# Patient Record
Sex: Male | Born: 1970 | Race: Black or African American | Hispanic: No | Marital: Single | Smoking: Never smoker
Health system: Southern US, Community
[De-identification: ages and names within clinical notes are randomized; demographics above are authoritative.]

## PROBLEM LIST (undated history)

## (undated) DIAGNOSIS — C499 Malignant neoplasm of connective and soft tissue, unspecified: Secondary | ICD-10-CM

## (undated) DIAGNOSIS — I428 Other cardiomyopathies: Secondary | ICD-10-CM

## (undated) DIAGNOSIS — F1010 Alcohol abuse, uncomplicated: Secondary | ICD-10-CM

## (undated) DIAGNOSIS — I639 Cerebral infarction, unspecified: Secondary | ICD-10-CM

## (undated) DIAGNOSIS — E785 Hyperlipidemia, unspecified: Secondary | ICD-10-CM

## (undated) HISTORY — DX: Alcohol abuse, uncomplicated: F10.10

## (undated) HISTORY — DX: Other cardiomyopathies: I42.8

## (undated) HISTORY — DX: Malignant neoplasm of connective and soft tissue, unspecified: C49.9

## (undated) HISTORY — DX: Hyperlipidemia, unspecified: E78.5

## (undated) HISTORY — PX: OTHER SURGICAL HISTORY: SHX169

---

## 2014-10-06 ENCOUNTER — Emergency Department: Payer: Self-pay | Admitting: Emergency Medicine

## 2015-08-01 DIAGNOSIS — I639 Cerebral infarction, unspecified: Secondary | ICD-10-CM

## 2015-08-01 HISTORY — DX: Cerebral infarction, unspecified: I63.9

## 2015-11-01 ENCOUNTER — Emergency Department: Payer: MEDICAID

## 2015-11-01 ENCOUNTER — Inpatient Hospital Stay
Admission: EM | Admit: 2015-11-01 | Discharge: 2015-11-03 | DRG: 065 | Disposition: A | Discharge status: Rehabilitation Facility | Payer: Self-pay | Attending: Internal Medicine | Admitting: Internal Medicine

## 2015-11-01 ENCOUNTER — Emergency Department: Payer: Self-pay

## 2015-11-01 ENCOUNTER — Encounter: Payer: Self-pay | Admitting: Emergency Medicine

## 2015-11-01 DIAGNOSIS — R29704 NIHSS score 4: Secondary | ICD-10-CM | POA: Diagnosis present

## 2015-11-01 DIAGNOSIS — I63532 Cerebral infarction due to unspecified occlusion or stenosis of left posterior cerebral artery: Principal | ICD-10-CM | POA: Diagnosis present

## 2015-11-01 DIAGNOSIS — F1010 Alcohol abuse, uncomplicated: Secondary | ICD-10-CM | POA: Diagnosis present

## 2015-11-01 DIAGNOSIS — E785 Hyperlipidemia, unspecified: Secondary | ICD-10-CM | POA: Diagnosis present

## 2015-11-01 DIAGNOSIS — R4789 Other speech disturbances: Secondary | ICD-10-CM | POA: Diagnosis present

## 2015-11-01 DIAGNOSIS — R202 Paresthesia of skin: Secondary | ICD-10-CM

## 2015-11-01 DIAGNOSIS — G459 Transient cerebral ischemic attack, unspecified: Secondary | ICD-10-CM | POA: Diagnosis present

## 2015-11-01 DIAGNOSIS — E860 Dehydration: Secondary | ICD-10-CM | POA: Diagnosis present

## 2015-11-01 DIAGNOSIS — Z7982 Long term (current) use of aspirin: Secondary | ICD-10-CM

## 2015-11-01 DIAGNOSIS — I426 Alcoholic cardiomyopathy: Secondary | ICD-10-CM | POA: Diagnosis present

## 2015-11-01 DIAGNOSIS — G8191 Hemiplegia, unspecified affecting right dominant side: Secondary | ICD-10-CM | POA: Diagnosis present

## 2015-11-01 DIAGNOSIS — I639 Cerebral infarction, unspecified: Secondary | ICD-10-CM | POA: Diagnosis present

## 2015-11-01 LAB — BASIC METABOLIC PANEL
Anion gap: 9 (ref 5–15)
BUN: 21 mg/dL — ABNORMAL HIGH (ref 6–20)
CO2: 24 mmol/L (ref 22–32)
Calcium: 9.5 mg/dL (ref 8.9–10.3)
Chloride: 102 mmol/L (ref 101–111)
Creatinine, Ser: 1.21 mg/dL (ref 0.61–1.24)
GFR calc Af Amer: >60 mL/min (ref >60)
GFR calc non Af Amer: >60 mL/min (ref >60)
Glucose, Bld: 102 mg/dL — ABNORMAL HIGH (ref 65–99)
Potassium: 3.5 mmol/L (ref 3.5–5.1)
Sodium: 135 mmol/L (ref 135–145)

## 2015-11-01 LAB — CBC WITH DIFFERENTIAL/PLATELET
Basophils Absolute: 0 10*3/uL (ref 0–0.1)
Basophils Relative: 0 %
Eosinophils Absolute: 0 10*3/uL (ref 0–0.7)
Eosinophils Relative: 0 %
HCT: 41.2 % (ref 40.0–52.0)
Hemoglobin: 13.8 g/dL (ref 13.0–18.0)
Lymphocytes Relative: 13 %
Lymphs Abs: 0.6 10*3/uL — ABNORMAL LOW (ref 1.0–3.6)
MCH: 29.2 pg (ref 26.0–34.0)
MCHC: 33.5 g/dL (ref 32.0–36.0)
MCV: 87.1 fL (ref 80.0–100.0)
Monocytes Absolute: 0.3 10*3/uL (ref 0.2–1.0)
Monocytes Relative: 6 %
Neutro Abs: 3.7 10*3/uL (ref 1.4–6.5)
Neutrophils Relative %: 81 %
Platelets: 146 10*3/uL — ABNORMAL LOW (ref 150–440)
RBC: 4.73 MIL/uL (ref 4.40–5.90)
RDW: 15.5 % — ABNORMAL HIGH (ref 11.5–14.5)
WBC: 4.6 10*3/uL (ref 3.8–10.6)

## 2015-11-01 LAB — TROPONIN I: Troponin I: <0.03 ng/mL (ref <0.031)

## 2015-11-01 LAB — GLUCOSE, CAPILLARY: Glucose-Capillary: 79 mg/dL (ref 65–99)

## 2015-11-01 MED ORDER — ACETAMINOPHEN 500 MG PO TABS
1000.0000 mg | ORAL_TABLET | Freq: Once | ORAL | Status: AC
  Administered 2015-11-01: 1000 mg via ORAL

## 2015-11-01 MED ORDER — IOPAMIDOL (ISOVUE-370) INJECTION 76%
75.0000 mL | Freq: Once | INTRAVENOUS | Status: AC | PRN
  Administered 2015-11-01: 75 mL via INTRAVENOUS
  Filled 2015-11-01: qty 75

## 2015-11-01 MED ORDER — ONDANSETRON 4 MG PO TBDP
4.0000 mg | ORAL_TABLET | Freq: Once | ORAL | Status: AC | PRN
  Administered 2015-11-01: 4 mg via ORAL

## 2015-11-01 MED ORDER — ASPIRIN 81 MG PO CHEW
CHEWABLE_TABLET | ORAL | Status: AC
  Filled 2015-11-01: qty 4

## 2015-11-01 MED ORDER — ASPIRIN EC 81 MG PO TBEC
81.0000 mg | DELAYED_RELEASE_TABLET | Freq: Every day | ORAL | Status: DC
Start: 2015-11-01 — End: 2015-11-02

## 2015-11-01 MED ORDER — ACETAMINOPHEN 500 MG PO TABS
ORAL_TABLET | ORAL | Status: AC
  Filled 2015-11-01: qty 2

## 2015-11-01 MED ORDER — ONDANSETRON 4 MG PO TBDP
ORAL_TABLET | ORAL | Status: AC
  Administered 2015-11-01: 4 mg via ORAL
  Filled 2015-11-01: qty 1

## 2015-11-01 MED ORDER — ASPIRIN 81 MG PO CHEW
324.0000 mg | CHEWABLE_TABLET | Freq: Once | ORAL | Status: AC
  Administered 2015-11-01: 324 mg via ORAL

## 2015-11-01 NOTE — ED Provider Notes (Addendum)
[facility] Emergency Department Provider Note  ____________________________________________  Time seen: 4:50 PM  I have reviewed the triage vital signs and the nursing notes.   HISTORY  Chief Complaint Numbness    HPI Austin Holmes is a 45 y.o. male who complains of pain and stiffness in the right leg. To me he reports that it started after lunch around 12:30 today as he was getting back up from sitting. In triage she reported that it started yesterday. 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 Denies any sudden injuries or strains that he is aware of. No recent falls. No weakness anywhere. No headaches or vision changes. No other symptoms anywhere. No changes in bowel or bladder habits. Patient indicates the posterior thigh as the location of concern.     History reviewed. No pertinent past medical history.   There are no active problems to display for this patient.    History reviewed. No pertinent past surgical history.   Current Outpatient Rx  Name  Route  Sig  Dispense  Refill  . aspirin EC 81 MG tablet   Oral   Take 1 tablet (81 mg total) by mouth daily.   60 tablet   0    none.   Allergies Review of patient's allergies indicates no known allergies.   No family history on file.  Social History Social History  Substance Use Topics  . Smoking status: Never Smoker   . Smokeless tobacco: None  . Alcohol Use: None    Review of Systems  Constitutional:   No fever or chills. No weight changes Eyes:   No vision changes.  ENT:   No sore throat. No rhinorrhea. Cardiovascular:   No chest pain. Respiratory:   No dyspnea or cough. Gastrointestinal:   Negative for abdominal pain, vomiting and diarrhea.  No BRBPR or melena. Genitourinary:   Negative for dysuria or difficulty urinating. Musculoskeletal:   Right  posterior thigh pain Skin:   Negative for rash. Neurological:   Negative for headaches, focal weakness. Patient reports some paresthesia in the right thigh.  10-point ROS otherwise negative.  ____________________________________________   PHYSICAL EXAM:  VITAL SIGNS: ED Triage Vitals  Enc Vitals Group     BP 11/01/15 1519 149/100 mmHg     Pulse Rate 11/01/15 1519 115     Resp 11/01/15 1519 20     Temp 11/01/15 1519 98.3 F (36.8 C)     Temp Source 11/01/15 1519 Oral     SpO2 11/01/15 1519 99 %     Weight 11/01/15 1519 210 lb (95.255 kg)     Height 11/01/15 1519 6\' 1"  (1.854 m)     Head Cir --      Peak Flow --      Pain Score 11/01/15 1702 5     Pain Loc --      Pain Edu? --      Excl. in [place]? --     Vital signs reviewed, nursing assessments reviewed.   Constitutional:   Alert and oriented. Well appearing and in no distress. Eyes:   No scleral icterus. No conjunctival pallor. PERRL. EOMI ENT   Head:   Normocephalic and atraumatic.   Nose:   No congestion/rhinnorhea. No septal hematoma   Mouth/Throat:   MMM, no pharyngeal erythema. No peritonsillar mass.    Neck:   No stridor.  No SubQ emphysema. No meningismus. Hematological/Lymphatic/Immunilogical:   No cervical lymphadenopathy. Cardiovascular:   RRR. Symmetric bilateral radial and DP pulses.  No murmurs. Heart rate 90. Respiratory:   Normal respiratory effort without tachypnea nor retractions. Breath sounds are clear and equal bilaterally. No wheezes/rales/rhonchi. Gastrointestinal:   Soft and nontender. Non distended. There is no CVA tenderness.  No rebound, rigidity, or guarding. Genitourinary:   deferred Musculoskeletal:   Nontender with normal range of motion in all extremities. No joint effusions.  No lower extremity tenderness.  No edema. Straight leg raise negative bilaterally. Neurologic:   Normal speech and language.  CN 2-10 normal. Motor grossly intact. Ambulatory, steady gait. Normal  coordination and balance. No gross focal neurologic deficits are appreciated.  Skin:    Skin is warm, dry and intact. No rash noted.  No petechiae, purpura, or bullae. Psychiatric:   Mood and affect are normal. ____________________________________________    LABS (pertinent positives/negatives) (all labs ordered are listed, but only abnormal results are displayed) Labs Reviewed  BASIC METABOLIC PANEL - Abnormal; Notable for the following:    Glucose, Bld 102 (*)    BUN 21 (*)    All other components within normal limits  CBC WITH DIFFERENTIAL/PLATELET - Abnormal; Notable for the following:    RDW 15.5 (*)    Platelets 146 (*)    Lymphs Abs 0.6 (*)    All other components within normal limits  GLUCOSE, CAPILLARY  TROPONIN I  CBG MONITORING, ED   ____________________________________________   EKG  Interpreted by me Sinus tachycardia rate 101, normal axis and intervals. Left bundle branch block. Diffuse T-wave inversions consistent with repolarization abnormality. No acute ischemic changes by Sgarbossa criteria.  ____________________________________________    RADIOLOGY  CT head unremarkable  ____________________________________________   PROCEDURES  CRITICAL CARE Performed by: Joni Fears, Pharrah Rottman   Total critical care time: 35 minutes  Critical care time was exclusive of separately billable procedures and treating other patients.  Critical care was necessary to treat or prevent imminent or life-threatening deterioration.  Critical care was time spent personally by me on the following activities: development of treatment plan with patient and/or surrogate as well as nursing, discussions with consultants, evaluation of patient's response to treatment, examination of patient, obtaining history from patient or surrogate, ordering and performing treatments and interventions, ordering and review of laboratory studies, ordering and review of radiographic studies, pulse  oximetry and re-evaluation of patient's condition.  ____________________________________________   INITIAL IMPRESSION / ASSESSMENT AND PLAN / ED COURSE  Pertinent labs & imaging results that were available during my care of the patient were reviewed by me and considered in my medical decision making (see chart for details).  Patient well appearing no acute distress. Complains of isolated pain and paresthesia of the right posterior thigh without any red flags for lower back pain or spinal or neurologic issue. Very low suspicion for stroke or intracranial hemorrhage. Patient has absolutely no risk factors for underlying cardiovascular disease. We'll put the patient on low-dose aspirin, NSAIDs as needed, follow up with primary care. The tachycardia at triage was not persistent on my exam. Low suspicion for any underlying infectious process. The patient may be mildly dehydrated but is tolerating oral intake.    ----------------------------------------- 6:48 PM on 11/01/2015 -----------------------------------------  On preparing for discharge at 5:50 PM, the patient was noted to be ataxic when standing. He did state that he felt somewhat dizzy as well. In the setting of his tachycardia at triage, this may be due to  some dehydration so the patient was given food and fluids to drink. He is reassessed at 6:40 PM after having consumed all these things, and actually seemed worse. He has found at that time to have demonstrable weakness of the right leg with hip flexion and resistance against gravity. It's unclear the symptom onset but it's likely at least 6 hours ago at this point and the patient would not be a candidate for TPA. He was given an oral aspirin load. At this point we will check labs and plan to admit for a stroke workup.    ----------------------------------------- 7:06 PM on 11/01/2015 -----------------------------------------  On continued assessment symptoms continue to be worsening.  He exhibits some right upper extremity drift and now complains of some diplopia. This is particularly notable as I recall the patient was able to lift his arm to offer a firm handshake when I first introduced myself to him around 5 PM today. He now clarifies that he thinks his symptoms started at 2 PM and not 12:30 PM. NIH Stroke Scale = 4. Now being approximately 5 hours out from the onset of symptoms with this updated timeframe, we will initiate a code stroke for emergent neurology consultation as well as obtain a CT angiogram of the neck and brain to evaluate for any intervenable lesion   ----------------------------------------- 7:33 PM on 11/01/2015 -----------------------------------------  Case discussed with the telemetry neurology consultant after his evaluation of the patient. Agrees that the findings are consistent with an acute stroke. Because the patient's history is somewhat unreliable, he is considering the onset of the stroke to be sometime yesterday and the patient not to be a candidate for intervention with TPA or endovascular therapy.. However he does agree with proceeding with a CT angiogram at this time for further delineation of the extent of injury and to rule out dissection. Plan discussed with patient and family at the bedside, advised him and eventual need for admission for complete stroke workup after CT scan.  ----------------------------------------- 8:55 PM on 11/01/2015 -----------------------------------------  Discussed with radiologist Dr. [place] Crane at 8:30 PM. Advises there is not a large vessel occlusion that would be amenable to endovascular intervention, but he does see a second-order segmental occlusion in the left PCA territory which is compatible with the patient's symptoms. Case discussed with the hospitalist for admission. ____________________________________________   FINAL CLINICAL IMPRESSION(S) / ED DIAGNOSES  Final diagnoses:  Paresthesia of lower  extremity  Acute CVA (cerebrovascular accident) ([place])  Acute ischemic left PCA stroke [facility])      Carrie Mew, MD 11/01/15 1723  Carrie Mew, MD 11/01/15 2056

## 2015-11-01 NOTE — ED Notes (Signed)
Reports a numbness feeling shooting down right leg since yesterday.  Ambulates well, grips equal, PERRL.

## 2015-11-01 NOTE — ED Notes (Signed)
As this RN was discharging patient, I watched patient walk about 10 feet and patient was having some difficulty, holding onto the wall.  MD notified.  MD instructed this RN to give patient sandwich tray and then ED physician would re-evaluate patient.

## 2015-11-01 NOTE — ED Notes (Signed)
Patient given [place] sandwich tray at this time.

## 2015-11-02 ENCOUNTER — Encounter: Payer: Self-pay | Admitting: Internal Medicine

## 2015-11-02 ENCOUNTER — Observation Stay: Payer: Self-pay

## 2015-11-02 DIAGNOSIS — G459 Transient cerebral ischemic attack, unspecified: Secondary | ICD-10-CM | POA: Diagnosis present

## 2015-11-02 DIAGNOSIS — I639 Cerebral infarction, unspecified: Secondary | ICD-10-CM

## 2015-11-02 LAB — TROPONIN I
Troponin I: <0.03 ng/mL (ref <0.031)
Troponin I: <0.03 ng/mL (ref <0.031)
Troponin I: <0.03 ng/mL (ref <0.031)

## 2015-11-02 LAB — CBC
HCT: 41.5 % (ref 40.0–52.0)
Hemoglobin: 14 g/dL (ref 13.0–18.0)
MCH: 28.7 pg (ref 26.0–34.0)
MCHC: 33.8 g/dL (ref 32.0–36.0)
MCV: 84.7 fL (ref 80.0–100.0)
Platelets: 147 10*3/uL — ABNORMAL LOW (ref 150–440)
RBC: 4.9 MIL/uL (ref 4.40–5.90)
RDW: 15.2 % — ABNORMAL HIGH (ref 11.5–14.5)
WBC: 3.9 10*3/uL (ref 3.8–10.6)

## 2015-11-02 LAB — CREATININE, SERUM
Creatinine, Ser: 1.05 mg/dL (ref 0.61–1.24)
GFR calc Af Amer: >60 mL/min (ref >60)
GFR calc non Af Amer: >60 mL/min (ref >60)

## 2015-11-02 LAB — LIPID PANEL
Cholesterol: 202 mg/dL — ABNORMAL HIGH (ref 0–200)
HDL: 91 mg/dL (ref >40)
LDL Cholesterol: 104 mg/dL — ABNORMAL HIGH (ref 0–99)
Total CHOL/HDL Ratio: 2.2 RATIO
Triglycerides: 37 mg/dL (ref <150)
VLDL: 7 mg/dL (ref 0–40)

## 2015-11-02 LAB — SEDIMENTATION RATE: Sed Rate: 4 mm/hr (ref 0–15)

## 2015-11-02 MED ORDER — STROKE: EARLY STAGES OF RECOVERY BOOK
Freq: Once | Status: AC
  Administered 2015-11-02: 03:00:00

## 2015-11-02 MED ORDER — ASPIRIN EC 81 MG PO TBEC: 81.0000 mg | DELAYED_RELEASE_TABLET | Freq: Every day | ORAL | Status: DC

## 2015-11-02 MED ORDER — ACETAMINOPHEN 650 MG RE SUPP: 650.0000 mg | RECTAL | Status: DC | PRN

## 2015-11-02 MED ORDER — SENNOSIDES-DOCUSATE SODIUM 8.6-50 MG PO TABS: 1.0000 | ORAL_TABLET | Freq: Every evening | ORAL | Status: DC | PRN

## 2015-11-02 MED ORDER — ASPIRIN EC 325 MG PO TBEC
325.0000 mg | DELAYED_RELEASE_TABLET | Freq: Every day | ORAL | Status: DC
  Administered 2015-11-02 – 2015-11-03 (×2): 325 mg via ORAL
  Filled 2015-11-02 (×2): qty 1

## 2015-11-02 MED ORDER — ACETAMINOPHEN 325 MG PO TABS
650.0000 mg | ORAL_TABLET | ORAL | Status: DC | PRN
  Administered 2015-11-02: 650 mg via ORAL
  Filled 2015-11-02: qty 2

## 2015-11-02 MED ORDER — SODIUM CHLORIDE 0.9 % IV SOLN
INTRAVENOUS | Status: DC
  Administered 2015-11-02 – 2015-11-03 (×3): via INTRAVENOUS

## 2015-11-02 MED ORDER — ATORVASTATIN CALCIUM 20 MG PO TABS
40.0000 mg | ORAL_TABLET | Freq: Every day | ORAL | Status: DC
  Administered 2015-11-02: 40 mg via ORAL
  Filled 2015-11-02: qty 2

## 2015-11-02 MED ORDER — ATORVASTATIN CALCIUM 40 MG PO TABS: 40.0000 mg | ORAL_TABLET | Freq: Every day | ORAL | Status: DC

## 2015-11-02 MED ORDER — POTASSIUM CHLORIDE CRYS ER 20 MEQ PO TBCR
20.0000 meq | EXTENDED_RELEASE_TABLET | Freq: Once | ORAL | Status: AC
  Administered 2015-11-02: 20 meq via ORAL
  Filled 2015-11-02: qty 1

## 2015-11-02 MED ORDER — ENOXAPARIN SODIUM 40 MG/0.4ML ~~LOC~~ SOLN
40.0000 mg | SUBCUTANEOUS | Status: DC
  Administered 2015-11-02: 22:00:00 40 mg via SUBCUTANEOUS
  Filled 2015-11-02: qty 0.4

## 2015-11-02 NOTE — Progress Notes (Signed)
Rehab Admissions Coordinator Note:  Patient was screened by Cleatrice Burke for appropriateness for an Inpatient Acute Rehab Consult per PT recommendation. Noted RN CM has spoken with pt and his Mother and they are interested in inpt rehab. Karene Fry, Admissions Coordinator will follow up for full assessment tomorrow. She can be reached at [phone]  Cleatrice Burke 11/02/2015, 4:33 PM  I can be reached at [phone].

## 2015-11-02 NOTE — Progress Notes (Signed)
PT Cancellation Note  Patient Details Name: Austin Holmes MRN: [id number] DOB: 1971/02/15   Cancelled Treatment:    Reason Eval/Treat Not Completed: Patient at procedure or test/unavailable (Consult received and chart reviewed.  Patient currently off unit for diagnostic testing; will re-attempt at later time/date as patient available and medically appropriate.)   Mayar Whittier H. Owens Shark, PT, DPT, NCS 11/02/2015, 10:53 AM [phone]

## 2015-11-02 NOTE — Care Management (Addendum)
Admitted to [facility] with the diagnosis of TIA. Lives with mother, [name] [phone] or [phone])  Last seen a primary care physician 2 years ago. Gave mother the list of physicians accepting new patients. States she "will start working on it." Mr. Terry works at [employer] on [address] x 1.5 years. Took care of all basic activities of daily living himself prior to this admission. Can drive, but doesn't have a driver's license. No insurance.  Physical therapy evaluation completed. Recommending Inpatient Acute Rehabilitation. Spoke with Mr. Siefke and his mother at the bedside. Would like to go to [place], if possible. Text message to Karene Fry RN representative for Inpatient Acute Rehab at [place]. Shelbie Ammons RN MSN CCM Care Management [phone]

## 2015-11-02 NOTE — H&P (Signed)
[facility] [place] NAME: Austin Holmes    MR#:  [id number]  DATE OF BIRTH:  1971/04/08  DATE OF ADMISSION:  11/01/2015  PRIMARY CARE PHYSICIAN: No primary care provider on file.   REQUESTING/REFERRING PHYSICIAN:   CHIEF COMPLAINT:   Chief Complaint  Patient presents with  . Numbness    HISTORY OF PRESENT ILLNESS: Austin Holmes  is a 45 y.o. male with no significant past medical history presented to the emergency room with the weakness in the right upper and lower extremity. The above complaint started yesterday evening after patient came back from work. Patient works in a [employer]. No history of any seizure. No history of any headache. He also had some numbness initially but in the emergency room he was able to move his right upper extremity and lower extremity with out any difficulty. No numbness or any tingling sensation in the emergency room. No complaints of any slurred speech or difficulty swallowing food. No history of any facial droop. No history of any stroke in the past. Patient was worked up with a CT head which showed no acute intracranial abnormality. Telemetry neurology consult was also done and patient is out of the TPA window, recommended MRI brain and a carotid ultrasound study and to start aspirin.  PAST MEDICAL HISTORY:  History reviewed. No pertinent past medical history.  PAST SURGICAL HISTORY: Past Surgical History  Procedure Laterality Date  . None      SOCIAL HISTORY:  Social History  Substance Use Topics  . Smoking status: Never Smoker   . Smokeless tobacco: Not on file  . Alcohol Use: 0.0 oz/week    0 Standard drinks or equivalent per week     Comment: occasionally drinks beer    FAMILY HISTORY:  Family History  Problem Relation Age of Onset  . Diabetes Mellitus II Maternal Grandmother   . Diabetes Mellitus II Maternal Grandfather     DRUG ALLERGIES: No Known  Allergies  REVIEW OF SYSTEMS:   CONSTITUTIONAL: No fever, fatigue or weakness.  EYES: No blurred or double vision.  EARS, NOSE, AND THROAT: No tinnitus or ear pain.  RESPIRATORY: No cough, shortness of breath, wheezing or hemoptysis.  CARDIOVASCULAR: No chest pain, orthopnea, edema.  GASTROINTESTINAL: No nausea, vomiting, diarrhea or abdominal pain.  GENITOURINARY: No dysuria, hematuria.  ENDOCRINE: No polyuria, nocturia,  HEMATOLOGY: No anemia, easy bruising or bleeding SKIN: No rash or lesion. MUSCULOSKELETAL: No joint pain or arthritis.   NEUROLOGIC: No tingling, numbness initially yesterday, weakness right upper and right lower extremity yesterday.  PSYCHIATRY: No anxiety or depression.   MEDICATIONS AT HOME:  Prior to Admission medications   Medication Sig Start Date End Date Taking? Authorizing Provider  aspirin EC 81 MG tablet Take 1 tablet (81 mg total) by mouth daily. 11/01/15 10/31/16  Carrie Mew, MD      PHYSICAL EXAMINATION:   VITAL SIGNS: Blood pressure 127/78, pulse 101, temperature 98.3 F (36.8 C), temperature source Oral, resp. rate 21, height 6\' 1"  (1.854 m), weight 95.255 kg (210 lb), SpO2 99 %.  GENERAL:  45 y.o.-year-old patient lying in the bed with no acute distress.  EYES: Pupils equal, round, reactive to light and accommodation. No scleral icterus. Extraocular muscles intact.  HEENT: Head atraumatic, normocephalic. Oropharynx and nasopharynx clear.  NECK:  Supple, no jugular venous distention. No thyroid enlargement, no tenderness.  LUNGS: Normal breath sounds bilaterally, no wheezing, rales,rhonchi or crepitation. No use of accessory  muscles of respiration.  CARDIOVASCULAR: S1, S2 normal. No murmurs, rubs, or gallops.  ABDOMEN: Soft, nontender, nondistended. Bowel sounds present. No organomegaly or mass.  EXTREMITIES: No pedal edema, cyanosis, or clubbing.  NEUROLOGIC: Cranial nerves II through XII are intact. Muscle strength 5/5 in all extremities.  Sensation intact. Gait normal.No cerebellar signs noted.Marland Kitchen  PSYCHIATRIC: The patient is alert and oriented x 3.  SKIN: No obvious rash, lesion, or ulcer.   LABORATORY PANEL:   CBC  Recent Labs Lab 11/01/15 1853  WBC 4.6  HGB 13.8  HCT 41.2  PLT 146*  MCV 87.1  MCH 29.2  MCHC 33.5  RDW 15.5*  LYMPHSABS 0.6*  MONOABS 0.3  EOSABS 0.0  BASOSABS 0.0   ------------------------------------------------------------------------------------------------------------------  Chemistries   Recent Labs Lab 11/01/15 1853  NA 135  K 3.5  CL 102  CO2 24  GLUCOSE 102*  BUN 21*  CREATININE 1.21  CALCIUM 9.5   ------------------------------------------------------------------------------------------------------------------ estimated creatinine clearance is 86.2 mL/min (by C-G formula based on Cr of 1.21). ------------------------------------------------------------------------------------------------------------------ No results for input(s): TSH, T4TOTAL, T3FREE, THYROIDAB in the last 72 hours.  Invalid input(s): FREET3   Coagulation profile No results for input(s): INR, PROTIME in the last 168 hours. ------------------------------------------------------------------------------------------------------------------- No results for input(s): DDIMER in the last 72 hours. -------------------------------------------------------------------------------------------------------------------  Cardiac Enzymes  Recent Labs Lab 11/01/15 1853  TROPONINI <0.03   ------------------------------------------------------------------------------------------------------------------ Invalid input(s): POCBNP  ---------------------------------------------------------------------------------------------------------------  Urinalysis No results found for: COLORURINE, APPEARANCEUR, LABSPEC, PHURINE, GLUCOSEU, HGBUR, BILIRUBINUR, KETONESUR, PROTEINUR, UROBILINOGEN, NITRITE,  LEUKOCYTESUR   RADIOLOGY: Ct Angio Head W/cm &/or Wo Cm  11/01/2015  ADDENDUM REPORT: 11/01/2015 20:36 ADDENDUM: Study discussed by telephone with Dr. Doren Custard STAFFORD on 11/01/2015 at 2030 hours. Electronically Signed   By: Genevie Ann M.D.   On: 11/01/2015 20:36  11/01/2015  CLINICAL DATA:  45 year old male with unsteady gait today, blurred vision, right lower extremity paresthesia. Initial encounter. EXAM: CT ANGIOGRAPHY HEAD AND NECK TECHNIQUE: Multidetector CT imaging of the head and neck was performed using the standard protocol during bolus administration of intravenous contrast. Multiplanar CT image reconstructions and MIPs were obtained to evaluate the vascular anatomy. Carotid stenosis measurements (when applicable) are obtained utilizing NASCET criteria, using the distal internal carotid diameter as the denominator. CONTRAST:  75 mL Isovue 370 COMPARISON:  Head CT without contrast 1636 hours today. FINDINGS: CTA NECK Skeleton:  No acute osseous abnormality identified. Mild paranasal sinus mucosal thickening. Sphenoid sinuses are spared. Tympanic cavities and mastoids are clear. Other neck: Negative lung apices. No superior mediastinal lymphadenopathy. Thyroid, larynx, pharynx, parapharyngeal spaces, retropharyngeal space, sublingual space, submandibular glands, and parotid glands are within normal limits. No cervical lymphadenopathy. Aortic arch: 3 vessel arch configuration. No arch atherosclerosis or great vessel origin stenosis. Right carotid system: Negative. Left carotid system: Negative. Vertebral arteries:Negative; no proximal subclavian artery stenosis, normal vertebral artery origins and no vertebral artery stenosis in the neck. The left vertebral artery is non dominant. CTA HEAD Posterior circulation: Mildly dominant distal right vertebral artery. Normal PICA origins. No distal vertebral artery stenosis. Patent vertebrobasilar junction. No basilar stenosis. Normal SCA and PCA origins. Both  posterior communicating arteries are present, the left is diminutive. There is a 6 mm segment of occlusion in the distal left PCA P2 segment. There is reconstituted flow distally. This is best seen on series 11, image 21. Right PCA branches are within normal limits. Anterior circulation: Both ICA siphons are patent. Both ophthalmic and posterior communicating artery origins are within normal limits. Both carotid termini are  patent. The left ACA is non dominant. Anterior communicating artery and ACA branches are within normal limits. Left MCA M1 segment, bifurcation, and left MCA branches are within normal limits. Right MCA M1 segment, bifurcation, and right MCA branches are within normal limits. Venous sinuses: Patent. Anatomic variants: Non dominant left vertebral artery and left ACA. Delayed phase: No abnormal enhancement identified. No cortically based acute infarct identified. No intracranial hemorrhage or mass effect. IMPRESSION: 1. Negative for emergent large vessel occlusion but Positive for segmental Occlusion vs. High-grade Stenosis in the Left PCA P2 segment. There is reconstituted distal left PCA flow. See series 11, image 21. 2. Stable and negative CT appearance of the brain. 3. No atherosclerosis or stenosis in the neck. Negative anterior circulation. Electronically Signed: By: Genevie Ann M.D. On: 11/01/2015 20:27   Ct Head Wo Contrast  11/01/2015  CLINICAL DATA:  Right leg numbness since yesterday EXAM: CT HEAD WITHOUT CONTRAST TECHNIQUE: Contiguous axial images were obtained from the base of the skull through the vertex without intravenous contrast. COMPARISON:  None. FINDINGS: No acute intracranial abnormality. Specifically, no hemorrhage, hydrocephalus, mass lesion, acute infarction, or significant intracranial injury. No acute calvarial abnormality. Visualized paranasal sinuses and mastoids clear. Orbital soft tissues unremarkable. IMPRESSION: No acute intracranial abnormality. Electronically Signed    By: Rolm Baptise M.D.   On: 11/01/2015 16:40   Ct Angio Neck W/cm &/or Wo/cm  11/01/2015  ADDENDUM REPORT: 11/01/2015 20:36 ADDENDUM: Study discussed by telephone with Dr. Doren Custard STAFFORD on 11/01/2015 at 2030 hours. Electronically Signed   By: Genevie Ann M.D.   On: 11/01/2015 20:36  11/01/2015  CLINICAL DATA:  45 year old male with unsteady gait today, blurred vision, right lower extremity paresthesia. Initial encounter. EXAM: CT ANGIOGRAPHY HEAD AND NECK TECHNIQUE: Multidetector CT imaging of the head and neck was performed using the standard protocol during bolus administration of intravenous contrast. Multiplanar CT image reconstructions and MIPs were obtained to evaluate the vascular anatomy. Carotid stenosis measurements (when applicable) are obtained utilizing NASCET criteria, using the distal internal carotid diameter as the denominator. CONTRAST:  75 mL Isovue 370 COMPARISON:  Head CT without contrast 1636 hours today. FINDINGS: CTA NECK Skeleton:  No acute osseous abnormality identified. Mild paranasal sinus mucosal thickening. Sphenoid sinuses are spared. Tympanic cavities and mastoids are clear. Other neck: Negative lung apices. No superior mediastinal lymphadenopathy. Thyroid, larynx, pharynx, parapharyngeal spaces, retropharyngeal space, sublingual space, submandibular glands, and parotid glands are within normal limits. No cervical lymphadenopathy. Aortic arch: 3 vessel arch configuration. No arch atherosclerosis or great vessel origin stenosis. Right carotid system: Negative. Left carotid system: Negative. Vertebral arteries:Negative; no proximal subclavian artery stenosis, normal vertebral artery origins and no vertebral artery stenosis in the neck. The left vertebral artery is non dominant. CTA HEAD Posterior circulation: Mildly dominant distal right vertebral artery. Normal PICA origins. No distal vertebral artery stenosis. Patent vertebrobasilar junction. No basilar stenosis. Normal SCA and PCA  origins. Both posterior communicating arteries are present, the left is diminutive. There is a 6 mm segment of occlusion in the distal left PCA P2 segment. There is reconstituted flow distally. This is best seen on series 11, image 21. Right PCA branches are within normal limits. Anterior circulation: Both ICA siphons are patent. Both ophthalmic and posterior communicating artery origins are within normal limits. Both carotid termini are patent. The left ACA is non dominant. Anterior communicating artery and ACA branches are within normal limits. Left MCA M1 segment, bifurcation, and left MCA branches are within normal limits.  Right MCA M1 segment, bifurcation, and right MCA branches are within normal limits. Venous sinuses: Patent. Anatomic variants: Non dominant left vertebral artery and left ACA. Delayed phase: No abnormal enhancement identified. No cortically based acute infarct identified. No intracranial hemorrhage or mass effect. IMPRESSION: 1. Negative for emergent large vessel occlusion but Positive for segmental Occlusion vs. High-grade Stenosis in the Left PCA P2 segment. There is reconstituted distal left PCA flow. See series 11, image 21. 2. Stable and negative CT appearance of the brain. 3. No atherosclerosis or stenosis in the neck. Negative anterior circulation. Electronically Signed: By: Genevie Ann M.D. On: 11/01/2015 20:27    EKG: Orders placed or performed during the hospital encounter of 11/01/15  . ED EKG  . ED EKG    IMPRESSION AND PLAN: 45 year old male patient with no significant past medical history presented to the emergency room with right upper and lower extremity weakness which started yesterday evening. Admitting diagnosis 1. Transient ischemic attack 2. 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  All the records are reviewed and case discussed with ED provider. Management plans discussed with the patient, family and they are in agreement.  CODE STATUS:FULL CODE Code Status History    This patient does not have a recorded code status. Please follow your organizational policy for patients in this situation.       TOTAL TIME TAKING CARE OF THIS PATIENT: 51 minutes.    Saundra Shelling M.D on 11/02/2015 at 12:40 AM  Between 7am to 6pm - Pager - [phone]  After 6pm go to [email or web address] - password [email or web address] [facility]  Office  [phone]  CC: Primary care physician; No primary care provider on file.

## 2015-11-02 NOTE — Progress Notes (Signed)
Speech Therapy Note: reviewed chart notes; consulted NSG re: pt's status today. NSG denied any speech or swallowing deficits during interactions w/ him this morning. Met w/ pt and family member. Pt verbally conversive(appropriate) and denied any swallowing or speech-language difficulties. Pt had just finished eating lunch meal w/out deficits reported.  Pt appears at his baseline for swallowing and language abilities. No further skilled ST services indicated at this time. NSG to reconsult if any change in status while admitted.

## 2015-11-02 NOTE — Evaluation (Signed)
Physical Therapy Evaluation Patient Details Name: Austin Holmes MRN: [id number] DOB: [birth year] Today's Date: 11/02/2015   History of Present Illness  presented to ER with acute onset of R UE/LE weakness and sensory deficit; admitted with TIA vs. CVA.  Head CT negative for acute change; MRI significant for L medial temporal lob and L thalamic infart with occlusion of L PCA beyond P1 segment.  Clinical Impression  Upon evaluation, patient alert and oriented to basic information; follows simple commands but appears to have some degree of deficit in higher-level problem-solving and comprehension.  Very impulsive with limited insight/awareness of deficits.  No noted language difficulties.  R UE/LE grossly 4/5 throughout, but with marked deficits in coordination and motor planning (poor control, mod dysmetria with fine motor and RAMPS).  Heavy R lateral lean with standing activities at times requiring constant mod assist from therapist for recovery/fall prevention. Able to complete sit/stand, basic transfers and gait (25') without assist device, mod assist +1-2; improves to min/mod assist +1-2 with use of RW.  Continues to require constant cuing and assist for safety awareness and dynamic balance. Patient very impulsive; high risk for falls with functional activities.  Significant R LE genu recurvatum in loading phase of all upright activities; able to self-correct at times, but requires constant cuing from therapist. Would benefit from skilled PT to address above deficits and promote optimal return to PLOF; recommend transition to acute inpatient rehab upon discharge for high-intensity, post-acute rehab services.  Patient very motivated and eager to progress/participate as able; good family support within home environment to maintain and continue POC upon discharge.      Follow Up Recommendations CIR    Equipment Recommendations       Recommendations for Other Services       Precautions /  Restrictions Precautions Precautions: Fall Restrictions Weight Bearing Restrictions: No      Mobility  Bed Mobility               General bed mobility comments: seated in recliner beginning/end of session  Transfers Overall transfer level: Needs assistance Equipment used: None Transfers: Sit to/from Stand Sit to Stand: Min assist;Mod assist         General transfer comment: Broad BOS with R lateral lean during movement transition, assist to prevent LOB.  Impulsive; poor insight into deficits  Ambulation/Gait Ambulation/Gait assistance: Mod assist;+2 physical assistance Ambulation Distance (Feet): 25 Feet Assistive device: None       General Gait Details: step to gait pattern with mod R lateral lean throughout gait cycle; R LE step height/length, foot placement inconsistent with occasional adduct in stance.  Excessive R abdominal flaring, R ant pelvic tilt with R genu recurvatum in stance (poor control without constant cuing).  Very impulsive  Stairs            Wheelchair Mobility    Modified Rankin (Stroke Patients Only)       Balance Overall balance assessment: Needs assistance Sitting-balance support: No upper extremity supported;Feet supported Sitting balance-Leahy Scale: Good     Standing balance support: No upper extremity supported Standing balance-Leahy Scale: Poor Standing balance comment: R lateral lean, min/mod assist to recover and prevent LOB at times. Marked delay in all R LE balance reactions                             Pertinent Vitals/Pain Pain Assessment: No/denies pain    Home Living Family/patient expects to be discharged  to:: Private residence Living Arrangements: Parent Available Help at Discharge: Family Type of Home: House Home Access: Stairs to enter Entrance Stairs-Rails: Can reach both Entrance Stairs-Number of Steps: 2 Home Layout: One level Home Equipment: None      Prior Function Level of  Independence: Independent         Comments: Indep with ADLs, household and community mobility; working full-time at an [employer].  Denies fall history.     Hand Dominance        Extremity/Trunk Assessment   Upper Extremity Assessment:  (R UE grossly 4/5 throughout; denies sensory deficit.  Moderate deficits in coordination and motor planning with marked dysmetria during finger/nose and RAMPS)           Lower Extremity Assessment:  (R LE grossly 4/5 throughout; denies sensory deficit.  Moderate deficits in coordination and motor planning (difficulty alternating movement patterns at times) with mod dysmetria/ataxia during heel to shin and RAMPS)         Communication   Communication: No difficulties  Cognition Arousal/Alertness: Awake/alert Behavior During Therapy: WFL for tasks assessed/performed Overall Cognitive Status: Impaired/Different from baseline (decreased insight/safety awareness; appears with deficit in problem-solving and processing at times.)                      General Comments      Exercises Other Exercises Other Exercises: Gait x75' with RW, min/mod assist +2 for safety--improved R knee control with use of assist device, but requires constant cuing for device position/management and overall safety awareness. Other Exercises: Standing balance at commode for continent bladder episode and at sink for hand hygiene, min/mod assist; very impulsive with turns and obstacle negotiation. Constantly reaching for walls/furniture for external stabilization; tends to leave R UE behind plane of movement without cuing from therapist.      Assessment/Plan    PT Assessment Patient needs continued PT services  PT Diagnosis Difficulty walking;Generalized weakness;Hemiplegia dominant side   PT Problem List Decreased strength;Decreased range of motion;Decreased activity tolerance;Decreased balance;Decreased mobility;Decreased coordination;Decreased  cognition;Decreased safety awareness;Decreased knowledge of precautions;Decreased knowledge of use of DME  PT Treatment Interventions Gait training;DME instruction;Stair training;Functional mobility training;Therapeutic activities;Therapeutic exercise;Balance training;Neuromuscular re-education;Cognitive remediation;Patient/family education   PT Goals (Current goals can be found in the Care Plan section) Acute Rehab PT Goals Patient Stated Goal: "to get this swelling down in my leg" PT Goal Formulation: With patient/family Time For Goal Achievement: 11/16/15 Potential to Achieve Goals: Good    Frequency 7X/week   Barriers to discharge Decreased caregiver support      Co-evaluation               End of Session Equipment Utilized During Treatment: Gait belt Activity Tolerance: Patient tolerated treatment well Patient left: in chair;with call bell/phone within reach;with family/visitor present Nurse Communication: Mobility status    Functional Assessment Tool Used: clinical judgement Functional Limitation: Mobility: Walking and moving around Mobility: Walking and Moving Around Current Status [phone]): At least 60 percent but less than 80 percent impaired, limited or restricted Mobility: Walking and Moving Around Goal Status [phone]): At least 1 percent but less than 20 percent impaired, limited or restricted    Time: 1345-1410 PT Time Calculation (min) (ACUTE ONLY): 25 min   Charges:   PT Evaluation $PT Eval Moderate Complexity: 1 Procedure PT Treatments $Neuromuscular Re-education: 8-22 mins   PT G Codes:   PT G-Codes **NOT FOR INPATIENT CLASS** Functional Assessment Tool Used: clinical judgement Functional Limitation: Mobility: Walking  and moving around Mobility: Walking and Moving Around Current Status [phone]): At least 60 percent but less than 80 percent impaired, limited or restricted Mobility: Walking and Moving Around Goal Status [phone]): At least 1 percent but less  than 20 percent impaired, limited or restricted    Nicholaus Steinke H. Owens Shark, PT, DPT, NCS 11/02/2015, 4:27 PM [phone]

## 2015-11-02 NOTE — Consult Note (Signed)
Referring Physician: Sudini    Chief Complaint: right sided weakness  HPI: Austin Holmes is an 45 y.o. male who reports going to bed on Friday and feeling at baseline.  From review ofteh chart though it seems the LKW time has been a moving target.  When he awakened on Saturay he noted a headache and difficulty walking.  His gait problems were not significant enough to stop him from going to work and working all day but at the end of the day his walking was even worse and with no improvement in his symptoms he presented for evaluation.   Initial NIHSS of 4.    Date last known well: 10/29/2015 Time last known well: Time: 20:30 tPA Given: No: Outside time window  MRankin: 0  History reviewed. No pertinent past medical history.  Patient does not receive yearly physicals.    Past Surgical History  Procedure Laterality Date  . None      Family History  Problem Relation Age of Onset  . Diabetes Mellitus II Maternal Grandmother   . Diabetes Mellitus II Maternal Grandfather     Mother with hypertension-alive.  Father deceased from pancreatic cancer.    Social History:  reports that he has never smoked. He does not have any smokeless tobacco history on file. He reports that he drinks alcohol. He reports that he does not use illicit drugs. He drinks 3-4 beers a day.    Allergies: No Known Allergies  Medications:  I have reviewed the patient's current medications. Prior to Admission:  No prescriptions prior to admission   Scheduled: . aspirin EC  325 mg Oral Daily  . atorvastatin  40 mg Oral q1800  . enoxaparin (LOVENOX) injection  40 mg Subcutaneous Q24H    ROS: History obtained from the patient  General ROS: negative for - chills, fatigue, fever, night sweats, weight gain or weight loss Psychological ROS: negative for - behavioral disorder, hallucinations, memory difficulties, mood swings or suicidal ideation Ophthalmic ROS: negative for - blurry vision, double vision, eye  pain or loss of vision ENT ROS: negative for - epistaxis, nasal discharge, oral lesions, sore throat, tinnitus or vertigo Allergy and Immunology ROS: negative for - hives or itchy/watery eyes Hematological and Lymphatic ROS: negative for - bleeding problems, bruising or swollen lymph nodes Endocrine ROS: negative for - galactorrhea, hair pattern changes, polydipsia/polyuria or temperature intolerance Respiratory ROS: negative for - cough, hemoptysis, shortness of breath or wheezing Cardiovascular ROS: negative for - chest pain, dyspnea on exertion, edema or irregular heartbeat Gastrointestinal ROS: negative for - abdominal pain, diarrhea, hematemesis, nausea/vomiting or stool incontinence Genito-Urinary ROS: negative for - dysuria, hematuria, incontinence or urinary frequency/urgency Musculoskeletal ROS: left knee pain Neurological ROS: as noted in HPI Dermatological ROS: negative for rash and skin lesion changes  Physical Examination: Blood pressure 132/85, pulse 87, temperature 99.1 F (37.3 C), temperature source Oral, resp. rate 22, height 6\' 1"  (1.854 m), weight 98.34 kg (216 lb 12.8 oz), SpO2 97 %.  HEENT-  Normocephalic, no lesions, without obvious abnormality.  Normal external eye and conjunctiva.  Normal TM's bilaterally.  Normal auditory canals and external ears. Normal external nose, mucus membranes and septum.  Normal pharynx. Cardiovascular- S1, S2 normal, pulses palpable throughout   Lungs- chest clear, no wheezing, rales, normal symmetric air entry Abdomen- soft, non-tender; bowel sounds normal; no masses,  no organomegaly Extremities- no edema Lymph-no adenopathy palpable Musculoskeletal-no joint tenderness, deformity or swelling Skin-warm and dry, no hyperpigmentation, vitiligo, or suspicious lesions  Neurological Examination Mental Status: Alert, oriented, thought content appropriate.  Speech nonfluent at times with word finding difficulties and paraphasic errors noted.   Able to follow 3 step commands without difficulty. Cranial Nerves: II: Discs flat bilaterally; Visual fields grossly normal, pupils equal, round, reactive to light and accommodation III,IV, VI: ptosis not present, extra-ocular motions intact bilaterally V,VII: decrease in right NLF, facial light touch sensation normal bilaterally VIII: hearing normal bilaterally IX,X: gag reflex present XI: bilateral shoulder shrug XII: midline tongue extension Motor: Right : Upper extremity   4/5    Left:     Upper extremity   5/5  Lower extremity   4/5     Lower extremity   5/5 Tone and bulk:normal tone throughout; no atrophy noted Sensory: Pinprick and light touch intact throughout, bilaterally Deep Tendon Reflexes: 1+ and symmetric with absent AJ's bilaterally Plantars: Right: downgoing   Left: downgoing Cerebellar: Dysmetria with right finger-to-nose and heel-to-shin testing Gait: wide based and requiring assistance    Laboratory Studies:  Basic Metabolic Panel:  Recent Labs Lab 11/01/15 1853 11/02/15 0557  NA 135  --   K 3.5  --   CL 102  --   CO2 24  --   GLUCOSE 102*  --   BUN 21*  --   CREATININE 1.21 1.05  CALCIUM 9.5  --     Liver Function Tests: No results for input(s): AST, ALT, ALKPHOS, BILITOT, PROT, ALBUMIN in the last 168 hours. No results for input(s): LIPASE, AMYLASE in the last 168 hours. No results for input(s): AMMONIA in the last 168 hours.  CBC:  Recent Labs Lab 11/01/15 1853 11/02/15 0557  WBC 4.6 3.9  NEUTROABS 3.7  --   HGB 13.8 14.0  HCT 41.2 41.5  MCV 87.1 84.7  PLT 146* 147*    Cardiac Enzymes:  Recent Labs Lab 11/01/15 1853 11/02/15 0120 11/02/15 0557 11/02/15 1244  TROPONINI <0.03 <0.03 <0.03 <0.03    BNP: Invalid input(s): POCBNP  CBG:  Recent Labs Lab 11/01/15 1710  GLUCAP 79    Microbiology: No results found for this or any previous visit.  Coagulation Studies: No results for input(s): LABPROT, INR in the last 72  hours.  Urinalysis: No results for input(s): COLORURINE, LABSPEC, PHURINE, GLUCOSEU, HGBUR, BILIRUBINUR, KETONESUR, PROTEINUR, UROBILINOGEN, NITRITE, LEUKOCYTESUR in the last 168 hours.  Invalid input(s): APPERANCEUR  Lipid Panel:    Component Value Date/Time   CHOL 202* 11/02/2015 0557   TRIG 37 11/02/2015 0557   HDL 91 11/02/2015 0557   CHOLHDL 2.2 11/02/2015 0557   VLDL 7 11/02/2015 0557   LDLCALC 104* 11/02/2015 0557    HgbA1C: No results found for: HGBA1C  Urine Drug Screen:  No results found for: LABOPIA, COCAINSCRNUR, LABBENZ, AMPHETMU, THCU, LABBARB  Alcohol Level: No results for input(s): ETH in the last 168 hours.  Other results: EKG: sinus tachycardia at 101 bpm.  Imaging: Ct Angio Head W/cm &/or Wo Cm  11/01/2015  ADDENDUM REPORT: 11/01/2015 20:36 ADDENDUM: Study discussed by telephone with Dr. Doren Custard STAFFORD on 11/01/2015 at 2030 hours. Electronically Signed   By: Genevie Ann M.D.   On: 11/01/2015 20:36  11/01/2015  CLINICAL DATA:  45 year old male with unsteady gait today, blurred vision, right lower extremity paresthesia. Initial encounter. EXAM: CT ANGIOGRAPHY HEAD AND NECK TECHNIQUE: Multidetector CT imaging of the head and neck was performed using the standard protocol during bolus administration of intravenous contrast. Multiplanar CT image reconstructions and MIPs were obtained to evaluate the vascular anatomy.  Carotid stenosis measurements (when applicable) are obtained utilizing NASCET criteria, using the distal internal carotid diameter as the denominator. CONTRAST:  75 mL Isovue 370 COMPARISON:  Head CT without contrast 1636 hours today. FINDINGS: CTA NECK Skeleton:  No acute osseous abnormality identified. Mild paranasal sinus mucosal thickening. Sphenoid sinuses are spared. Tympanic cavities and mastoids are clear. Other neck: Negative lung apices. No superior mediastinal lymphadenopathy. Thyroid, larynx, pharynx, parapharyngeal spaces, retropharyngeal space,  sublingual space, submandibular glands, and parotid glands are within normal limits. No cervical lymphadenopathy. Aortic arch: 3 vessel arch configuration. No arch atherosclerosis or great vessel origin stenosis. Right carotid system: Negative. Left carotid system: Negative. Vertebral arteries:Negative; no proximal subclavian artery stenosis, normal vertebral artery origins and no vertebral artery stenosis in the neck. The left vertebral artery is non dominant. CTA HEAD Posterior circulation: Mildly dominant distal right vertebral artery. Normal PICA origins. No distal vertebral artery stenosis. Patent vertebrobasilar junction. No basilar stenosis. Normal SCA and PCA origins. Both posterior communicating arteries are present, the left is diminutive. There is a 6 mm segment of occlusion in the distal left PCA P2 segment. There is reconstituted flow distally. This is best seen on series 11, image 21. Right PCA branches are within normal limits. Anterior circulation: Both ICA siphons are patent. Both ophthalmic and posterior communicating artery origins are within normal limits. Both carotid termini are patent. The left ACA is non dominant. Anterior communicating artery and ACA branches are within normal limits. Left MCA M1 segment, bifurcation, and left MCA branches are within normal limits. Right MCA M1 segment, bifurcation, and right MCA branches are within normal limits. Venous sinuses: Patent. Anatomic variants: Non dominant left vertebral artery and left ACA. Delayed phase: No abnormal enhancement identified. No cortically based acute infarct identified. No intracranial hemorrhage or mass effect. IMPRESSION: 1. Negative for emergent large vessel occlusion but Positive for segmental Occlusion vs. High-grade Stenosis in the Left PCA P2 segment. There is reconstituted distal left PCA flow. See series 11, image 21. 2. Stable and negative CT appearance of the brain. 3. No atherosclerosis or stenosis in the neck.  Negative anterior circulation. Electronically Signed: By: Genevie Ann M.D. On: 11/01/2015 20:27   Ct Head Wo Contrast  11/01/2015  CLINICAL DATA:  Right leg numbness since yesterday EXAM: CT HEAD WITHOUT CONTRAST TECHNIQUE: Contiguous axial images were obtained from the base of the skull through the vertex without intravenous contrast. COMPARISON:  None. FINDINGS: No acute intracranial abnormality. Specifically, no hemorrhage, hydrocephalus, mass lesion, acute infarction, or significant intracranial injury. No acute calvarial abnormality. Visualized paranasal sinuses and mastoids clear. Orbital soft tissues unremarkable. IMPRESSION: No acute intracranial abnormality. Electronically Signed   By: Rolm Baptise M.D.   On: 11/01/2015 16:40   Ct Angio Neck W/cm &/or Wo/cm  11/01/2015  ADDENDUM REPORT: 11/01/2015 20:36 ADDENDUM: Study discussed by telephone with Dr. Doren Custard STAFFORD on 11/01/2015 at 2030 hours. Electronically Signed   By: Genevie Ann M.D.   On: 11/01/2015 20:36  11/01/2015  CLINICAL DATA:  45 year old male with unsteady gait today, blurred vision, right lower extremity paresthesia. Initial encounter. EXAM: CT ANGIOGRAPHY HEAD AND NECK TECHNIQUE: Multidetector CT imaging of the head and neck was performed using the standard protocol during bolus administration of intravenous contrast. Multiplanar CT image reconstructions and MIPs were obtained to evaluate the vascular anatomy. Carotid stenosis measurements (when applicable) are obtained utilizing NASCET criteria, using the distal internal carotid diameter as the denominator. CONTRAST:  75 mL Isovue 370 COMPARISON:  Head CT without  contrast 1636 hours today. FINDINGS: CTA NECK Skeleton:  No acute osseous abnormality identified. Mild paranasal sinus mucosal thickening. Sphenoid sinuses are spared. Tympanic cavities and mastoids are clear. Other neck: Negative lung apices. No superior mediastinal lymphadenopathy. Thyroid, larynx, pharynx, parapharyngeal spaces,  retropharyngeal space, sublingual space, submandibular glands, and parotid glands are within normal limits. No cervical lymphadenopathy. Aortic arch: 3 vessel arch configuration. No arch atherosclerosis or great vessel origin stenosis. Right carotid system: Negative. Left carotid system: Negative. Vertebral arteries:Negative; no proximal subclavian artery stenosis, normal vertebral artery origins and no vertebral artery stenosis in the neck. The left vertebral artery is non dominant. CTA HEAD Posterior circulation: Mildly dominant distal right vertebral artery. Normal PICA origins. No distal vertebral artery stenosis. Patent vertebrobasilar junction. No basilar stenosis. Normal SCA and PCA origins. Both posterior communicating arteries are present, the left is diminutive. There is a 6 mm segment of occlusion in the distal left PCA P2 segment. There is reconstituted flow distally. This is best seen on series 11, image 21. Right PCA branches are within normal limits. Anterior circulation: Both ICA siphons are patent. Both ophthalmic and posterior communicating artery origins are within normal limits. Both carotid termini are patent. The left ACA is non dominant. Anterior communicating artery and ACA branches are within normal limits. Left MCA M1 segment, bifurcation, and left MCA branches are within normal limits. Right MCA M1 segment, bifurcation, and right MCA branches are within normal limits. Venous sinuses: Patent. Anatomic variants: Non dominant left vertebral artery and left ACA. Delayed phase: No abnormal enhancement identified. No cortically based acute infarct identified. No intracranial hemorrhage or mass effect. IMPRESSION: 1. Negative for emergent large vessel occlusion but Positive for segmental Occlusion vs. High-grade Stenosis in the Left PCA P2 segment. There is reconstituted distal left PCA flow. See series 11, image 21. 2. Stable and negative CT appearance of the brain. 3. No atherosclerosis or  stenosis in the neck. Negative anterior circulation. Electronically Signed: By: Genevie Ann M.D. On: 11/01/2015 20:27   Mr Brain Wo Contrast  11/02/2015  CLINICAL DATA:  45 year old male with un steady gait, blurred vision right lower extremity paresthesias. Subsequent encounter. EXAM: MRI HEAD WITHOUT CONTRAST MRA HEAD WITHOUT CONTRAST TECHNIQUE: Multiplanar, multiecho pulse sequences of the brain and surrounding structures were obtained without intravenous contrast. Angiographic images of the head were obtained using MRA technique without contrast. COMPARISON:  CT angiogram 11/01/2015. 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 No intracranial hemorrhage. Very mild periventricular white matter changes may be related to result of small vessel disease. No age advanced atrophy or hydrocephalus. No intracranial mass lesion noted on this unenhanced exam. Decreased signal intensity of bone marrow (most notable upper cervical spine) may be related to patient's habitus. Correlation with CBC to exclude anemia contributing to this appearance may be considered Cervical medullary junction, pituitary region, pineal region and orbital structures unremarkable. Junction Mild paranasal sinus mucosal thickening. MRA HEAD FINDINGS Narrowing of the cavernous segment of the internal carotid artery greater on left. This is felt to represent overestimation of degree of narrowing given the appearance on recent CT angiogram. Mild irregularity M1 segment middle cerebral artery bilaterally. Minimal bulge superior margin felt to be origin of a vessel rather than aneurysm. Moderate narrowing M2 segment /middle cerebral artery branches bilaterally. Mild to moderate narrowing A1 segment left anterior cerebral artery. Moderate to marked narrowing A2 segment left anterior cerebral artery. Mild irregularity with minimal to mild narrowing portions of right  anterior cerebral artery. Moderate  narrowing distal vertebral arteries more notable on the left. Nonvisualized posterior inferior cerebellar arteries and anterior inferior cerebellar arteries bilaterally. Mild to slightly moderate narrowing and irregularity basilar artery. 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 Mild to moderate narrowing of portions of the right posterior cerebral artery. Moderate narrowing superior cerebellar artery bilaterally. 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 Decreased signal intensity of bone marrow (most notable upper cervical spine) may be related to patient's habitus. Correlation with CBC to exclude anemia contributing to this appearance may be considered MRA HEAD MR angiogram appears to overestimate degree of narrowing given the appearance on recent CT angiogram as detailed above. 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 Electronically Signed   By: Genia Del M.D.   On: 11/02/2015 11:54   Mr Jodene Nam Head/brain Wo Cm  11/02/2015  CLINICAL DATA:  45 year old male with un steady gait, blurred vision right lower extremity paresthesias. Subsequent encounter. EXAM: MRI HEAD WITHOUT CONTRAST MRA HEAD WITHOUT CONTRAST TECHNIQUE: Multiplanar, multiecho pulse sequences of the brain and surrounding structures were obtained without intravenous contrast. Angiographic images of the head were obtained using MRA technique without contrast. COMPARISON:  CT angiogram 11/01/2015. 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 No intracranial hemorrhage. Very mild periventricular white matter changes may be related to result of small vessel disease. No age advanced atrophy or hydrocephalus. No intracranial mass lesion noted on this unenhanced exam. Decreased signal intensity of bone marrow (most notable upper cervical spine) may be related to patient's habitus.  Correlation with CBC to exclude anemia contributing to this appearance may be considered Cervical medullary junction, pituitary region, pineal region and orbital structures unremarkable. Junction Mild paranasal sinus mucosal thickening. MRA HEAD FINDINGS Narrowing of the cavernous segment of the internal carotid artery greater on left. This is felt to represent overestimation of degree of narrowing given the appearance on recent CT angiogram. Mild irregularity M1 segment middle cerebral artery bilaterally. Minimal bulge superior margin felt to be origin of a vessel rather than aneurysm. Moderate narrowing M2 segment /middle cerebral artery branches bilaterally. Mild to moderate narrowing A1 segment left anterior cerebral artery. Moderate to marked narrowing A2 segment left anterior cerebral artery. Mild irregularity with minimal to mild narrowing portions of right anterior cerebral artery. Moderate narrowing distal vertebral arteries more notable on the left. Nonvisualized posterior inferior cerebellar arteries and anterior inferior cerebellar arteries bilaterally. Mild to slightly moderate narrowing and irregularity basilar artery. 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 Mild to moderate narrowing of portions of the right posterior cerebral artery. Moderate narrowing superior cerebellar artery bilaterally. 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 Decreased signal intensity of bone marrow (most notable upper cervical spine) may be related to patient's habitus. Correlation with CBC to exclude anemia contributing to this appearance may be considered MRA HEAD MR angiogram appears to overestimate degree of narrowing given the appearance on recent CT angiogram as detailed above. 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 Electronically Signed   By: Genia Del M.D.   On: 11/02/2015 11:54    Assessment:  45 y.o. male presenting with a right hemiparesis and mild speech deficits.  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  LDL 104.  Patient on no antiplatelet therapy at home.  Further work up recommended.    Stroke Risk Factors - hyperlipidemia  Plan: 1. HgbA1c, hypercoagulable work up 2. PT consult, OT consult, Speech consult 3. Echocardiogram pending 4. Prophylactic therapy-Antiplatelet med: Aspirin - dose 325mg  daily 6. Telemetry monitoring 7. Frequent neuro checks    Alexis Goodell, MD Neurology [phone] 11/02/2015, 2:59 PM

## 2015-11-02 NOTE — ED Notes (Signed)
Pt mother, [name] number is [phone] would like to be contacted with updates when pt is admitted

## 2015-11-02 NOTE — Discharge Instructions (Signed)

## 2015-11-03 ENCOUNTER — Encounter: Payer: Self-pay | Admitting: Physical Medicine and Rehabilitation

## 2015-11-03 ENCOUNTER — Inpatient Hospital Stay
Admit: 2015-11-03 | Discharge: 2015-11-03 | Disposition: A | Discharge status: Home / Self Care | Payer: MEDICAID | Attending: Internal Medicine | Admitting: Internal Medicine

## 2015-11-03 ENCOUNTER — Inpatient Hospital Stay (HOSPITAL_COMMUNITY)
Admission: RE | Admit: 2015-11-03 | Discharge: 2015-11-09 | DRG: 057 | Disposition: A | Discharge status: Home / Self Care | Payer: MEDICAID | Source: Other Acute Inpatient Hospital | Attending: Physical Medicine & Rehabilitation | Admitting: Physical Medicine & Rehabilitation

## 2015-11-03 DIAGNOSIS — I6340 Cerebral infarction due to embolism of unspecified cerebral artery: Secondary | ICD-10-CM

## 2015-11-03 DIAGNOSIS — R2240 Localized swelling, mass and lump, unspecified lower limb: Secondary | ICD-10-CM | POA: Diagnosis present

## 2015-11-03 DIAGNOSIS — G8191 Hemiplegia, unspecified affecting right dominant side: Secondary | ICD-10-CM

## 2015-11-03 DIAGNOSIS — I5020 Unspecified systolic (congestive) heart failure: Secondary | ICD-10-CM | POA: Diagnosis present

## 2015-11-03 DIAGNOSIS — I426 Alcoholic cardiomyopathy: Secondary | ICD-10-CM | POA: Diagnosis present

## 2015-11-03 DIAGNOSIS — I69398 Other sequelae of cerebral infarction: Secondary | ICD-10-CM

## 2015-11-03 DIAGNOSIS — I69351 Hemiplegia and hemiparesis following cerebral infarction affecting right dominant side: Principal | ICD-10-CM

## 2015-11-03 DIAGNOSIS — R609 Edema, unspecified: Secondary | ICD-10-CM

## 2015-11-03 DIAGNOSIS — I429 Cardiomyopathy, unspecified: Secondary | ICD-10-CM

## 2015-11-03 DIAGNOSIS — E785 Hyperlipidemia, unspecified: Secondary | ICD-10-CM | POA: Diagnosis present

## 2015-11-03 DIAGNOSIS — R4189 Other symptoms and signs involving cognitive functions and awareness: Secondary | ICD-10-CM | POA: Diagnosis present

## 2015-11-03 DIAGNOSIS — I428 Other cardiomyopathies: Secondary | ICD-10-CM

## 2015-11-03 DIAGNOSIS — Z7289 Other problems related to lifestyle: Secondary | ICD-10-CM

## 2015-11-03 DIAGNOSIS — I447 Left bundle-branch block, unspecified: Secondary | ICD-10-CM | POA: Diagnosis present

## 2015-11-03 DIAGNOSIS — I63432 Cerebral infarction due to embolism of left posterior cerebral artery: Secondary | ICD-10-CM | POA: Diagnosis present

## 2015-11-03 DIAGNOSIS — M79651 Pain in right thigh: Secondary | ICD-10-CM | POA: Diagnosis present

## 2015-11-03 DIAGNOSIS — C499 Malignant neoplasm of connective and soft tissue, unspecified: Secondary | ICD-10-CM | POA: Diagnosis present

## 2015-11-03 DIAGNOSIS — R4587 Impulsiveness: Secondary | ICD-10-CM | POA: Diagnosis present

## 2015-11-03 DIAGNOSIS — C4022 Malignant neoplasm of long bones of left lower limb: Secondary | ICD-10-CM | POA: Diagnosis present

## 2015-11-03 DIAGNOSIS — R2242 Localized swelling, mass and lump, left lower limb: Secondary | ICD-10-CM

## 2015-11-03 DIAGNOSIS — R269 Unspecified abnormalities of gait and mobility: Secondary | ICD-10-CM | POA: Diagnosis present

## 2015-11-03 LAB — ANTITHROMBIN III: AntiThromb III Func: 54 % — ABNORMAL LOW (ref 75–120)

## 2015-11-03 LAB — ECHOCARDIOGRAM COMPLETE
Height: 73 in
Weight: 3468.8 [oz_av]

## 2015-11-03 LAB — HEMOGLOBIN A1C: Hgb A1c MFr Bld: 5 % (ref 4.0–6.0)

## 2015-11-03 MED ORDER — ALUM & MAG HYDROXIDE-SIMETH 200-200-20 MG/5ML PO SUSP: 30.0000 mL | ORAL | Status: DC | PRN

## 2015-11-03 MED ORDER — BISACODYL 10 MG RE SUPP: 10.0000 mg | Freq: Every day | RECTAL | Status: DC | PRN

## 2015-11-03 MED ORDER — ASPIRIN EC 325 MG PO TBEC
325.0000 mg | DELAYED_RELEASE_TABLET | Freq: Every day | ORAL | Status: DC
  Administered 2015-11-04 – 2015-11-09 (×6): 325 mg via ORAL
  Filled 2015-11-03 (×6): qty 1

## 2015-11-03 MED ORDER — FLEET ENEMA 7-19 GM/118ML RE ENEM: 1.0000 | ENEMA | Freq: Once | RECTAL | Status: DC | PRN

## 2015-11-03 MED ORDER — PROCHLORPERAZINE 25 MG RE SUPP: 12.5000 mg | Freq: Four times a day (QID) | RECTAL | Status: DC | PRN

## 2015-11-03 MED ORDER — METOPROLOL SUCCINATE ER 25 MG PO TB24
25.0000 mg | ORAL_TABLET | Freq: Every day | ORAL | Status: DC
  Administered 2015-11-04 – 2015-11-09 (×6): 25 mg via ORAL
  Filled 2015-11-03 (×6): qty 1

## 2015-11-03 MED ORDER — TRAZODONE HCL 50 MG PO TABS: 25.0000 mg | ORAL_TABLET | Freq: Every evening | ORAL | Status: DC | PRN

## 2015-11-03 MED ORDER — SENNOSIDES-DOCUSATE SODIUM 8.6-50 MG PO TABS: 2.0000 | ORAL_TABLET | Freq: Every evening | ORAL | Status: DC | PRN

## 2015-11-03 MED ORDER — PROCHLORPERAZINE MALEATE 5 MG PO TABS: 5.0000 mg | ORAL_TABLET | Freq: Four times a day (QID) | ORAL | Status: DC | PRN

## 2015-11-03 MED ORDER — PROCHLORPERAZINE EDISYLATE 5 MG/ML IJ SOLN: 5.0000 mg | Freq: Four times a day (QID) | INTRAMUSCULAR | Status: DC | PRN

## 2015-11-03 MED ORDER — ASPIRIN EC 81 MG PO TBEC: 81.0000 mg | DELAYED_RELEASE_TABLET | Freq: Every day | ORAL | Status: DC

## 2015-11-03 MED ORDER — DIPHENHYDRAMINE HCL 12.5 MG/5ML PO ELIX: 12.5000 mg | ORAL_SOLUTION | Freq: Four times a day (QID) | ORAL | Status: DC | PRN

## 2015-11-03 MED ORDER — ACETAMINOPHEN 325 MG PO TABS: 325.0000 mg | ORAL_TABLET | ORAL | Status: DC | PRN

## 2015-11-03 MED ORDER — METOPROLOL SUCCINATE ER 25 MG PO TB24: 25.0000 mg | ORAL_TABLET | Freq: Every day | ORAL | Status: DC

## 2015-11-03 MED ORDER — ENOXAPARIN SODIUM 40 MG/0.4ML ~~LOC~~ SOLN: 40.0000 mg | SUBCUTANEOUS | Status: DC

## 2015-11-03 MED ORDER — GUAIFENESIN-DM 100-10 MG/5ML PO SYRP: 5.0000 mL | ORAL_SOLUTION | Freq: Four times a day (QID) | ORAL | Status: DC | PRN

## 2015-11-03 MED ORDER — ATORVASTATIN CALCIUM 40 MG PO TABS
40.0000 mg | ORAL_TABLET | Freq: Every day | ORAL | Status: DC
  Administered 2015-11-03 – 2015-11-08 (×6): 40 mg via ORAL
  Filled 2015-11-03 (×6): qty 1

## 2015-11-03 MED ORDER — ENOXAPARIN SODIUM 40 MG/0.4ML ~~LOC~~ SOLN
40.0000 mg | SUBCUTANEOUS | Status: DC
  Administered 2015-11-03 – 2015-11-08 (×6): 40 mg via SUBCUTANEOUS
  Filled 2015-11-03 (×6): qty 0.4

## 2015-11-03 NOTE — H&P (Signed)
Physical Medicine and Rehabilitation Admission H&P    Chief Complaint  Patient presents with  . Weakness and numbness right side with difficulty walking   HPI:  Austin Holmes is a 45 y.o. male in relatively good health who was admitted on to [facility] on 11/01/2015 with HA, right sided weakness and progressive difficulty walking.  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 2D echo with severe reduction in systolic funciton with diffuse hypokinesis and EF 20-25% with trivial AVR. 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 Dr. Tyron Russell evaluated patient and recommended full strength ASA for stroke prevention and hypercoagulopathy work up--labs pending.     Review of Systems  Constitutional: Negative for fever and chills.  Eyes: Negative for blurred vision and double vision.  Gastrointestinal: Negative for nausea.  Neurological: Negative for dizziness, speech change, focal weakness and weakness.  All other systems reviewed and are negative.     History reviewed. No pertinent past medical history.    Past Surgical History  Procedure Laterality Date  . None      Family History  Problem Relation Age of Onset  . Diabetes Mellitus II Maternal Grandmother   . Diabetes Mellitus II Maternal Grandfather   . Cancer Father     pancreatic    Social History:  Single. Works in [employer] and lives with mother.  that he has never smoked. He does not have any smokeless tobacco history on file. He reports that he drinks 3-4 beers daily.  He reports that he does not use illicit drugs.    Allergies: No Known Allergies    No prescriptions prior to admission    Home: Home Living Family/patient expects to be discharged to:: Private residence Living Arrangements: Parent Available Help at  Discharge: Family Type of Home: House Home Access: Stairs to enter Technical brewer of Steps: 2 Entrance Stairs-Rails: Can reach both Home Layout: One level Bathroom Shower/Tub: Tub/shower unit, Architectural technologist: Standard Home Equipment: None   Functional History: Prior Function Level of Independence: Independent Comments: Independent with ADLs/IADLs, working full-time at a [employer].  Functional Status:  Mobility: Bed Mobility General bed mobility comments: seated in recliner beginning/end of session Transfers Overall transfer level: Needs assistance Equipment used: None Transfers: Sit to/from Stand Sit to Stand: Min assist, Mod assist General transfer comment: Broad BOS with R lateral lean during movement transition, assist to prevent LOB.  Impulsive; poor insight into deficits Ambulation/Gait Ambulation/Gait assistance: Mod assist, +2 physical assistance Ambulation Distance (Feet): 25 Feet Assistive device: None General Gait Details: step to gait pattern with mod R lateral lean throughout gait cycle; R LE step height/length, foot placement inconsistent with occasional adduct in stance.  Excessive R abdominal flaring, R ant pelvic tilt with R genu recurvatum in stance (poor control without constant cuing).  Very impulsive    ADL: ADL Overall ADL's : Needs assistance/impaired Eating/Feeding: Set up (Pt. is using his Left nondominant hand to complete. MinA with right) Grooming: Minimal assistance (With right hand secondary to incoordination.) Upper Body Dressing : Moderate assistance Toilet Transfer Details (indicate cue type and reason): MinA for functional transfers with RW with verbal cues.  Cognition: Cognition Overall Cognitive Status: Impaired/Different from baseline Orientation Level: Oriented X4 Cognition Arousal/Alertness: Awake/alert Behavior During Therapy: WFL for tasks assessed/performed Overall Cognitive Status: Impaired/Different from  baseline General Comments: Pt. requires increased time for processing directions.    Blood pressure 122/81, pulse 93, temperature 98.8 F (37.1 C), temperature source Oral, resp. rate 18, height 6' 1" (1.854 m), weight 98.34 kg (216 lb 12.8 oz), SpO2 100 %. Physical Exam  Vitals reviewed. Constitutional: He is oriented to person, place, and time. He appears well-developed and well-nourished.  HENT:  Head: Normocephalic and atraumatic.  Eyes: Conjunctivae and EOM are normal.  Neck: Normal range of motion. Neck supple.  Cardiovascular: Normal rate and regular rhythm.   Respiratory: Effort normal and breath sounds normal.  GI: Soft. Bowel sounds are normal.  Musculoskeletal: He exhibits no edema or tenderness.  Neurological: He is alert and oriented to person, place, and time. He has normal reflexes.  Sensation intact to light touch Motor: 5/5 throughout  Skin: Skin is warm and dry.  Psychiatric: His behavior is normal. His mood appears anxious. He expresses impulsivity.    Results for orders placed or performed during the hospital encounter of 11/01/15 (from the past 48 hour(s))  Glucose, capillary     Status: None   Collection Time: 11/01/15  5:10 PM  Result Value Ref Range   Glucose-Capillary 79 65 - 99 mg/dL  Basic metabolic panel     Status: Abnormal   Collection Time: 11/01/15  6:53 PM  Result Value Ref Range   Sodium 135 135 - 145 mmol/L   Potassium 3.5 3.5 - 5.1 mmol/L   Chloride 102 101 - 111 mmol/L   CO2 24 22 - 32 mmol/L   Glucose, Bld 102 (H) 65 - 99 mg/dL   BUN 21 (H) 6 - 20 mg/dL   Creatinine, Ser 1.21 0.61 - 1.24 mg/dL   Calcium 9.5 8.9 - 10.3 mg/dL   GFR calc non Af Amer >60 >60 mL/min   GFR calc Af Amer >60 >60 mL/min    Comment: (NOTE) The eGFR has been calculated using the CKD EPI equation. This calculation has not been validated in all clinical situations. eGFR's persistently <60 mL/min signify possible Chronic Kidney Disease.    Anion gap 9 5 - 15    CBC with Differential     Status: Abnormal   Collection Time: 11/01/15  6:53 PM  Result Value Ref Range   WBC 4.6 3.8 - 10.6 K/uL   RBC 4.73 4.40 - 5.90 MIL/uL   Hemoglobin 13.8 13.0 - 18.0 g/dL   HCT 41.2 40.0 - 52.0 %   MCV 87.1 80.0 - 100.0 fL   MCH 29.2 26.0 - 34.0 pg   MCHC 33.5 32.0 - 36.0 g/dL   RDW 15.5 (H) 11.5 - 14.5 %   Platelets 146 (L) 150 - 440 K/uL   Neutrophils Relative % 81 %   Neutro Abs 3.7 1.4 - 6.5 K/uL   Lymphocytes Relative 13 %   Lymphs Abs 0.6 (L) 1.0 - 3.6 K/uL   Monocytes Relative 6 %   Monocytes Absolute 0.3 0.2 - 1.0 K/uL   Eosinophils Relative 0 %   Eosinophils Absolute 0.0 0 - 0.7 K/uL   Basophils Relative 0 %   Basophils Absolute 0.0 0 - 0.1 K/uL  Troponin I     Status: None   Collection Time: 11/01/15  6:53 PM  Result Value Ref Range   Troponin I <0.03 <0.031 ng/mL    Comment:        NO INDICATION OF MYOCARDIAL INJURY.   Troponin I (q 6hr x 3)     Status: None  Collection Time: 11/02/15  1:20 AM  Result Value Ref Range   Troponin I <0.03 <0.031 ng/mL    Comment:        NO INDICATION OF MYOCARDIAL INJURY.   Troponin I (q 6hr x 3)     Status: None   Collection Time: 11/02/15  5:57 AM  Result Value Ref Range   Troponin I <0.03 <0.031 ng/mL    Comment:        NO INDICATION OF MYOCARDIAL INJURY.   Lipid panel     Status: Abnormal   Collection Time: 11/02/15  5:57 AM  Result Value Ref Range   Cholesterol 202 (H) 0 - 200 mg/dL   Triglycerides 37 <150 mg/dL   HDL 91 >40 mg/dL   Total CHOL/HDL Ratio 2.2 RATIO   VLDL 7 0 - 40 mg/dL   LDL Cholesterol 104 (H) 0 - 99 mg/dL    Comment:        Total Cholesterol/HDL:CHD Risk Coronary Heart Disease Risk Table                     Men   Women  1/2 Average Risk   3.4   3.3  Average Risk       5.0   4.4  2 X Average Risk   9.6   7.1  3 X Average Risk  23.4   11.0        Use the calculated Patient Ratio above and the CHD Risk Table to determine the patient's CHD Risk.        ATP III  CLASSIFICATION (LDL):  <100     mg/dL   Optimal  100-129  mg/dL   Near or Above                    Optimal  130-159  mg/dL   Borderline  160-189  mg/dL   High  >190     mg/dL   Very High   CBC     Status: Abnormal   Collection Time: 11/02/15  5:57 AM  Result Value Ref Range   WBC 3.9 3.8 - 10.6 K/uL   RBC 4.90 4.40 - 5.90 MIL/uL   Hemoglobin 14.0 13.0 - 18.0 g/dL   HCT 41.5 40.0 - 52.0 %   MCV 84.7 80.0 - 100.0 fL   MCH 28.7 26.0 - 34.0 pg   MCHC 33.8 32.0 - 36.0 g/dL   RDW 15.2 (H) 11.5 - 14.5 %   Platelets 147 (L) 150 - 440 K/uL  Creatinine, serum     Status: None   Collection Time: 11/02/15  5:57 AM  Result Value Ref Range   Creatinine, Ser 1.05 0.61 - 1.24 mg/dL   GFR calc non Af Amer >60 >60 mL/min   GFR calc Af Amer >60 >60 mL/min    Comment: (NOTE) The eGFR has been calculated using the CKD EPI equation. This calculation has not been validated in all clinical situations. eGFR's persistently <60 mL/min signify possible Chronic Kidney Disease.   Troponin I (q 6hr x 3)     Status: None   Collection Time: 11/02/15 12:44 PM  Result Value Ref Range   Troponin I <0.03 <0.031 ng/mL    Comment:        NO INDICATION OF MYOCARDIAL INJURY.   Sedimentation rate     Status: None   Collection Time: 11/02/15  5:05 PM  Result Value Ref Range   Sed Rate 4 0 - 15  mm/hr  Hemoglobin A1c     Status: None   Collection Time: 11/02/15  5:05 PM  Result Value Ref Range   Hgb A1c MFr Bld 5.0 4.0 - 6.0 %   Ct Angio Head W/cm &/or Wo Cm  11/01/2015  ADDENDUM REPORT: 11/01/2015 20:36 ADDENDUM: Study discussed by telephone with Dr. Doren Custard STAFFORD on 11/01/2015 at 2030 hours. Electronically Signed   By: Genevie Ann M.D.   On: 11/01/2015 20:36  11/01/2015  CLINICAL DATA:  45 year old male with unsteady gait today, blurred vision, right lower extremity paresthesia. Initial encounter. EXAM: CT ANGIOGRAPHY HEAD AND NECK TECHNIQUE: Multidetector CT imaging of the head and neck was performed using the  standard protocol during bolus administration of intravenous contrast. Multiplanar CT image reconstructions and MIPs were obtained to evaluate the vascular anatomy. Carotid stenosis measurements (when applicable) are obtained utilizing NASCET criteria, using the distal internal carotid diameter as the denominator. CONTRAST:  75 mL Isovue 370 COMPARISON:  Head CT without contrast 1636 hours today. FINDINGS: CTA NECK Skeleton:  No acute osseous abnormality identified. Mild paranasal sinus mucosal thickening. Sphenoid sinuses are spared. Tympanic cavities and mastoids are clear. Other neck: Negative lung apices. No superior mediastinal lymphadenopathy. Thyroid, larynx, pharynx, parapharyngeal spaces, retropharyngeal space, sublingual space, submandibular glands, and parotid glands are within normal limits. No cervical lymphadenopathy. Aortic arch: 3 vessel arch configuration. No arch atherosclerosis or great vessel origin stenosis. Right carotid system: Negative. Left carotid system: Negative. Vertebral arteries:Negative; no proximal subclavian artery stenosis, normal vertebral artery origins and no vertebral artery stenosis in the neck. The left vertebral artery is non dominant. CTA HEAD Posterior circulation: Mildly dominant distal right vertebral artery. Normal PICA origins. No distal vertebral artery stenosis. Patent vertebrobasilar junction. No basilar stenosis. Normal SCA and PCA origins. Both posterior communicating arteries are present, the left is diminutive. There is a 6 mm segment of occlusion in the distal left PCA P2 segment. There is reconstituted flow distally. This is best seen on series 11, image 21. Right PCA branches are within normal limits. Anterior circulation: Both ICA siphons are patent. Both ophthalmic and posterior communicating artery origins are within normal limits. Both carotid termini are patent. The left ACA is non dominant. Anterior communicating artery and ACA branches are within  normal limits. Left MCA M1 segment, bifurcation, and left MCA branches are within normal limits. Right MCA M1 segment, bifurcation, and right MCA branches are within normal limits. Venous sinuses: Patent. Anatomic variants: Non dominant left vertebral artery and left ACA. Delayed phase: No abnormal enhancement identified. No cortically based acute infarct identified. No intracranial hemorrhage or mass effect. IMPRESSION: 1. Negative for emergent large vessel occlusion but Positive for segmental Occlusion vs. High-grade Stenosis in the Left PCA P2 segment. There is reconstituted distal left PCA flow. See series 11, image 21. 2. Stable and negative CT appearance of the brain. 3. No atherosclerosis or stenosis in the neck. Negative anterior circulation. Electronically Signed: By: Genevie Ann M.D. On: 11/01/2015 20:27   Ct Head Wo Contrast  11/01/2015  CLINICAL DATA:  Right leg numbness since yesterday EXAM: CT HEAD WITHOUT CONTRAST TECHNIQUE: Contiguous axial images were obtained from the base of the skull through the vertex without intravenous contrast. COMPARISON:  None. FINDINGS: No acute intracranial abnormality. Specifically, no hemorrhage, hydrocephalus, mass lesion, acute infarction, or significant intracranial injury. No acute calvarial abnormality. Visualized paranasal sinuses and mastoids clear. Orbital soft tissues unremarkable. IMPRESSION: No acute intracranial abnormality. Electronically Signed   By: Rolm Baptise M.D.  On: 11/01/2015 16:40   Ct Angio Neck W/cm &/or Wo/cm  11/01/2015  ADDENDUM REPORT: 11/01/2015 20:36 ADDENDUM: Study discussed by telephone with Dr. Doren Custard STAFFORD on 11/01/2015 at 2030 hours. Electronically Signed   By: Genevie Ann M.D.   On: 11/01/2015 20:36  11/01/2015  CLINICAL DATA:  45 year old male with unsteady gait today, blurred vision, right lower extremity paresthesia. Initial encounter. EXAM: CT ANGIOGRAPHY HEAD AND NECK TECHNIQUE: Multidetector CT imaging of the head and neck was  performed using the standard protocol during bolus administration of intravenous contrast. Multiplanar CT image reconstructions and MIPs were obtained to evaluate the vascular anatomy. Carotid stenosis measurements (when applicable) are obtained utilizing NASCET criteria, using the distal internal carotid diameter as the denominator. CONTRAST:  75 mL Isovue 370 COMPARISON:  Head CT without contrast 1636 hours today. FINDINGS: CTA NECK Skeleton:  No acute osseous abnormality identified. Mild paranasal sinus mucosal thickening. Sphenoid sinuses are spared. Tympanic cavities and mastoids are clear. Other neck: Negative lung apices. No superior mediastinal lymphadenopathy. Thyroid, larynx, pharynx, parapharyngeal spaces, retropharyngeal space, sublingual space, submandibular glands, and parotid glands are within normal limits. No cervical lymphadenopathy. Aortic arch: 3 vessel arch configuration. No arch atherosclerosis or great vessel origin stenosis. Right carotid system: Negative. Left carotid system: Negative. Vertebral arteries:Negative; no proximal subclavian artery stenosis, normal vertebral artery origins and no vertebral artery stenosis in the neck. The left vertebral artery is non dominant. CTA HEAD Posterior circulation: Mildly dominant distal right vertebral artery. Normal PICA origins. No distal vertebral artery stenosis. Patent vertebrobasilar junction. No basilar stenosis. Normal SCA and PCA origins. Both posterior communicating arteries are present, the left is diminutive. There is a 6 mm segment of occlusion in the distal left PCA P2 segment. There is reconstituted flow distally. This is best seen on series 11, image 21. Right PCA branches are within normal limits. Anterior circulation: Both ICA siphons are patent. Both ophthalmic and posterior communicating artery origins are within normal limits. Both carotid termini are patent. The left ACA is non dominant. Anterior communicating artery and ACA  branches are within normal limits. Left MCA M1 segment, bifurcation, and left MCA branches are within normal limits. Right MCA M1 segment, bifurcation, and right MCA branches are within normal limits. Venous sinuses: Patent. Anatomic variants: Non dominant left vertebral artery and left ACA. Delayed phase: No abnormal enhancement identified. No cortically based acute infarct identified. No intracranial hemorrhage or mass effect. IMPRESSION: 1. Negative for emergent large vessel occlusion but Positive for segmental Occlusion vs. High-grade Stenosis in the Left PCA P2 segment. There is reconstituted distal left PCA flow. See series 11, image 21. 2. Stable and negative CT appearance of the brain. 3. No atherosclerosis or stenosis in the neck. Negative anterior circulation. Electronically Signed: By: Genevie Ann M.D. On: 11/01/2015 20:27   Mr Brain Wo Contrast  11/02/2015  CLINICAL DATA:  45 year old male with un steady gait, blurred vision right lower extremity paresthesias. Subsequent encounter. EXAM: MRI HEAD WITHOUT CONTRAST MRA HEAD WITHOUT CONTRAST TECHNIQUE: Multiplanar, multiecho pulse sequences of the brain and surrounding structures were obtained without intravenous contrast. Angiographic images of the head were obtained using MRA technique without contrast. COMPARISON:  CT angiogram 11/01/2015. 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 No intracranial hemorrhage. Very mild periventricular white matter changes may be related to result of small vessel disease. No age advanced atrophy or hydrocephalus. No intracranial mass lesion noted on this unenhanced exam. Decreased signal intensity of bone  marrow (most notable upper cervical spine) may be related to patient's habitus. Correlation with CBC to exclude anemia contributing to this appearance may be considered Cervical medullary junction, pituitary region, pineal region and orbital structures  unremarkable. Junction Mild paranasal sinus mucosal thickening. MRA HEAD FINDINGS Narrowing of the cavernous segment of the internal carotid artery greater on left. This is felt to represent overestimation of degree of narrowing given the appearance on recent CT angiogram. Mild irregularity M1 segment middle cerebral artery bilaterally. Minimal bulge superior margin felt to be origin of a vessel rather than aneurysm. Moderate narrowing M2 segment /middle cerebral artery branches bilaterally. Mild to moderate narrowing A1 segment left anterior cerebral artery. Moderate to marked narrowing A2 segment left anterior cerebral artery. Mild irregularity with minimal to mild narrowing portions of right anterior cerebral artery. Moderate narrowing distal vertebral arteries more notable on the left. Nonvisualized posterior inferior cerebellar arteries and anterior inferior cerebellar arteries bilaterally. Mild to slightly moderate narrowing and irregularity basilar artery. 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 Mild to moderate narrowing of portions of the right posterior cerebral artery. Moderate narrowing superior cerebellar artery bilaterally. 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 Decreased signal intensity of bone marrow (most notable upper cervical spine) may be related to patient's habitus. Correlation with CBC to exclude anemia contributing to this appearance may be considered MRA HEAD MR angiogram appears to overestimate degree of narrowing given the appearance on recent CT angiogram as detailed above. 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 Electronically Signed   By: Genia Del M.D.   On: 11/02/2015 11:54   Mr Jodene Nam Head/brain Wo Cm  11/02/2015  CLINICAL DATA:  45 year old male with un steady gait, blurred vision right lower extremity paresthesias. Subsequent encounter. EXAM: MRI HEAD WITHOUT  CONTRAST MRA HEAD WITHOUT CONTRAST TECHNIQUE: Multiplanar, multiecho pulse sequences of the brain and surrounding structures were obtained without intravenous contrast. Angiographic images of the head were obtained using MRA technique without contrast. COMPARISON:  CT angiogram 11/01/2015. 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 No intracranial hemorrhage. Very mild periventricular white matter changes may be related to result of small vessel disease. No age advanced atrophy or hydrocephalus. No intracranial mass lesion noted on this unenhanced exam. Decreased signal intensity of bone marrow (most notable upper cervical spine) may be related to patient's habitus. Correlation with CBC to exclude anemia contributing to this appearance may be considered Cervical medullary junction, pituitary region, pineal region and orbital structures unremarkable. Junction Mild paranasal sinus mucosal thickening. MRA HEAD FINDINGS Narrowing of the cavernous segment of the internal carotid artery greater on left. This is felt to represent overestimation of degree of narrowing given the appearance on recent CT angiogram. Mild irregularity M1 segment middle cerebral artery bilaterally. Minimal bulge superior margin felt to be origin of a vessel rather than aneurysm. Moderate narrowing M2 segment /middle cerebral artery branches bilaterally. Mild to moderate narrowing A1 segment left anterior cerebral artery. Moderate to marked narrowing A2 segment left anterior cerebral artery. Mild irregularity with minimal to mild narrowing portions of right anterior cerebral artery. Moderate narrowing distal vertebral arteries more notable on the left. Nonvisualized posterior inferior cerebellar arteries and anterior inferior cerebellar arteries bilaterally. Mild to slightly moderate narrowing and irregularity basilar artery. Occlusion of the left posterior cerebral artery just beyond P1  segment. Mild to moderate narrowing of portions of the right posterior  cerebral artery. Moderate narrowing superior cerebellar artery bilaterally. 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 Decreased signal intensity of bone marrow (most notable upper cervical spine) may be related to patient's habitus. Correlation with CBC to exclude anemia contributing to this appearance may be considered MRA HEAD MR angiogram appears to overestimate degree of narrowing given the appearance on recent CT angiogram as detailed above. 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 Electronically Signed   By: Genia Del M.D.   On: 11/02/2015 11:54   Medical Problem List and Plan: 1.  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  Cont meds 2.  DVT Prophylaxis/Anticoagulation: Mechanical: Sequential compression devices, below knee Bilateral lower extremities Pharmaceutical: Lovenox 3. Pain Management: Tylenol PRN. 4. Mood: Environmental support. 5. Neuropsych: This patient is capable of making decisions on his own behalf. 6. Skin/Wound Care: Routine skin care. 7. Fluids/Electrolytes/Nutrition: Routine I/Os with follow up chemistries. 8. Dyslipidemia: Lipitor 9. Systolic CHF: Cont toprolol.  Will add ACE if BP permits.  Will need to follow up with Cardiology.     Post Admission Physician Evaluation: 1. Functional deficits secondary  to left medial temporal lobe and left thalamic infarct. 2. Patient is admitted to receive collaborative, interdisciplinary care between the physiatrist, rehab nursing staff, and therapy team. 3. Patient's level of medical complexity and substantial therapy needs in context of that medical necessity cannot be provided at a lesser intensity of care such as a SNF. 4. Patient has experienced substantial functional loss from his/her baseline which was documented  above under the "Functional History" and "Functional Status" headings.  Judging by the patient's diagnosis, physical exam, and functional history, the patient has potential for functional progress which will result in measurable gains while on inpatient rehab.  These gains will be of substantial and practical use upon discharge  in facilitating mobility and self-care at the household level. 5. Physiatrist will provide 24 hour management of medical needs as well as oversight of the therapy plan/treatment and provide guidance as appropriate regarding the interaction of the two. 6. 24 hour rehab nursing will assist with safety, disease management and patient education and help integrate therapy concepts, techniques,education, etc. 7. PT will assess and treat for/with: Lower extremity strength, range of motion, stamina, balance, functional mobility, safety, adaptive techniques and equipment, woundcare, coping skills, pain control, education.   Goals are: Supervision. 8. OT will assess and treat for/with: ADL's, functional mobility, safety, upper extremity strength, adaptive techniques and equipment, wound mgt, ego support, and community reintegration.   Goals are: Supervision/mod I. Therapy may proceed with showering this patient. 9. Case Management and Social Worker will assess and treat for psychological issues and discharge planning. 10. Team conference will be held weekly to assess progress toward goals and to determine barriers to discharge. 11. Patient will receive at least 3 hours of therapy per day at least 5 days per week. 12. ELOS: 14-18 days.       13. Prognosis:  good  Delice Lesch, MD  11/03/2015

## 2015-11-03 NOTE — Progress Notes (Signed)
Ankit Lorie Phenix, MD Physician Signed Physical Medicine and Rehabilitation PMR Pre-admission 11/03/2015 11:23 AM  Related encounter: ED to Hosp-Admission (Current) from 11/01/2015 in [place] (1C)    Expand All Collapse All     [employer] PMR Admission Coordinator Pre-Admission Assessment  Patient: Austin Holmes is an 45 y.o., male MRN: [id number] DOB: 05-16-1971 Height: '6\' 1"'  (185.4 cm) Weight: 98.34 kg (216 lb 12.8 oz)  Insurance Information No insurance - self pay  Medicaid Application Date: Case Manager:  Disability Application Date: Case Worker:   Emergency Contact Information Contact Information    Name Relation Home Work Mobile   Austin Holmes,Austin Holmes Mother [phone]  [phone]      Current Medical History  Patient Admitting Diagnosis: Left medial temporal thalamic infarct  History of Present Illness: a 45 y.o. male with no significant past medical history presented to the emergency room with the weakness in the right upper and lower extremity. The above complaint started yesterday evening after patient came back from work. Patient works in a [employer]. No history of any seizure. No history of any headache. He also had some numbness initially but in the emergency room he was able to move his right upper extremity and lower extremity with out any difficulty. No numbness or any tingling sensation in the emergency room. No complaints of any slurred speech or difficulty swallowing food. No history of any facial droop. No history of any stroke in the past. Patient was worked up with a CT head which showed no acute intracranial abnormality. Telemetry neurology consult was also done and patient is out of the TPA window, recommended MRI brain and a carotid ultrasound study and to start aspirin. Was admitted to medical floor with tele. No Afib/flutter on Tele. MRI showed acute left cva left temporal and  thalamus. Carotids normal. Echo showed EF 25-30%. No symptoms. Started on Toprol XL 25 mg daily. Will need routine cardiology f/u as OP. Will need ACE i added by PCP or Cardiology if BP tolerates. Symptoms of right weakness improving with therapy recommendations for inpatient rehab.  Patient's medical record from [facility] has been reviewed by the rehabilitation admission coordinator and physician.  NIH Stroke scale: 4  Past Medical History  History reviewed. No pertinent past medical history.  Family History  family history includes Diabetes Mellitus II in his maternal grandfather and maternal grandmother.  Prior Rehab/Hospitalizations Has the patient had major surgery during 100 days prior to admission? No   Current Medications See MAR from [facility]  Patients Current Diet: Regular diet, thin liquids  Precautions / Restrictions Precautions Precautions: Fall Restrictions Weight Bearing Restrictions: No   Has the patient had 2 or more falls or a fall with injury in the past year?No  Prior Activity Level Community (5-7x/wk): Went out daily. Worked fulltime at [employer]  Prior Functional Level Self Care: Did the patient need help bathing, dressing, using the toilet or eating? Independent  Indoor Mobility: Did the patient need assistance with walking from room to room (with or without device)? Independent  Stairs: Did the patient need assistance with internal or external stairs (with or without device)? Independent  Functional Cognition: Did the patient need help planning regular tasks such as shopping or remembering to take medications? Independent  Home Assistive Devices / Equipment Home Assistive Devices/Equipment: None Home Equipment: None  Prior Device Use: Indicate devices/aids used by the patient prior to current illness, exacerbation or injury? None   Prior  Functional Level Current Functional Level  Bed Mobility   Independent  Mod assist   Transfers  Independent  Mod assist   Mobility - Walk/Wheelchair  Independent  Mod assist (Ambulated 25 feet no device.)   Upper Body Dressing  Independent  Mod assist   Lower Body Dressing  Independent  Other (Not tried.)   Grooming  Independent  Min assist   Eating/Drinking  Independent  Min assist   Toilet Transfer  Independent  Min assist   Bladder Continence   WDL  Continent. Voiding in urinal and BRP with help   Bowel Management  WDL  Reports last BM 11/02/15   Stair Climbing     Other (Not tried.)   Communication  Intact  WDL   Memory  Intact  Intact   Cooking/Meal Prep  Independent     Housework  Independent    Money Management  Independent    Driving  Independent     Special needs/care consideration BiPAP/CPAP No CPM No Continuous Drip IV 0.9% NS 75 mL/hr Dialysis No  Life Vest No Oxygen No Special Bed No Trach Size No Wound Vac (area) No  Skin No  Bowel mgmt: Patient reports BM last evening 11/02/15 Bladder mgmt: Voiding in urinal and BRP with assist Diabetic mgmt No  Previous Home Environment Living Arrangements: Parent Available Help at Discharge: Family Type of Home: House Home Layout: One level Home Access: Stairs to enter Entrance Stairs-Rails: Can reach both Entrance Stairs-Number of Steps: 2 Bathroom Shower/Tub: Tub/shower unit, Architectural technologist: [place]: No  Discharge Living Setting Plans for Discharge Living Setting: House, Lives with (comment) (Lives with mom.) Type of Home at Discharge: House Discharge Home Layout: One level Discharge Home Access: Stairs to enter Entrance Stairs-Number of Steps: 2 steps Does the patient have any problems obtaining your medications?: No  Social/Family/Support Systems Patient Roles: Other (Comment) (Has a mom.) Contact  Information: Austin Holmes - mother Anticipated Caregiver: mother Anticipated Caregiver's Contact Information: [name] - mom (h) [phone] (c) [phone] Ability/Limitations of Caregiver: Mom is retired and can assist. Building control surveyor Availability: 24/7 Discharge Plan Discussed with Primary Caregiver: Yes Is Caregiver In Agreement with Plan?: Yes Does Caregiver/Family have Issues with Lodging/Transportation while Pt is in Rehab?: No  Goals/Additional Needs Patient/Family Goal for Rehab: PT/OT supervision and min assist, ST mod I and supervision Expected length of stay: 7-10 days Cultural Considerations: None Dietary Needs: Regular diet, thin liquids Equipment Needs: TBD Pt/Family Agrees to Admission and willing to participate: Yes Program Orientation Provided & Reviewed with Pt/Caregiver Including Roles & Responsibilities: Yes  Patient Condition: I have met with patient and interviewed him and his mother. Patient suffered a LCVA. He was previously independent and working. He was evaluated by PT and OT with inpatient rehab recommendations. Can benefit from ST evaluation to address impulsivity and higher level problem solving. Patient can benefit and tolerate our inpatient rehab program where he will receive 3 hours of therapy a day. I have reviewed and discussed findings with rehab MD and have approval for acute inpatient rehab admission for today.  Preadmission Screen Completed By: Retta Diones, 11/03/2015 11:25 AM ______________________________________________________________________  Discussed status with Dr. Posey Pronto on 11/03/15 at 1142 and received telephone approval for admission today.  Admission Coordinator: Retta Diones, time 1142/Date 11/03/15   Assessment/Plan: Diagnosis: L medial temporal lob and L thalamic infart 1. Does the need for close, 24 hr/day Medical supervision in concert with the patient's rehab needs make it unreasonable for this  patient to be served in a  less intensive setting? Yes 2. Co-Morbidities requiring supervision/potential complications: thrombocytopenia, systolic CHF 3. Due to safety, disease management and patient education, does the patient require 24 hr/day rehab nursing? Yes 4. Does the patient require coordinated care of a physician, rehab nurse, PT (1-2 hrs/day, 5 days/week) and OT (1-2 hrs/day, 5 days/week) to address physical and functional deficits in the context of the above medical diagnosis(es)? Potentially Addressing deficits in the following areas: balance, endurance, locomotion, strength, transferring, toileting and psychosocial support 5. Can the patient actively participate in an intensive therapy program of at least 3 hrs of therapy 5 days a week? Yes 6. The potential for patient to make measurable gains while on inpatient rehab is excellent 7. Anticipated functional outcomes upon discharge from inpatients are: supervision PT, modified independent and supervision OT, n/a SLP 8. Estimated rehab length of stay to reach the above functional goals is: 14-18 days. 9. Does the patient have adequate social supports to accommodate these discharge functional goals? Potentially 10. Anticipated D/C setting: Home 11. Anticipated post D/C treatments: HH therapy and Home excercise program 12. Overall Rehab/Functional Prognosis: good    RECOMMENDATIONS: This patient's condition is appropriate for continued rehabilitative care in the following setting: CIR Patient has agreed to participate in recommended program. Yes Note that insurance prior authorization may be required for reimbursement for recommended care.  Retta Diones 11/03/2015  Delice Lesch, MD 11/03/15      Revision History     Date/Time User Provider Type Action   11/03/2015 11:57 AM Ankit Lorie Phenix, MD Physician Sign   11/03/2015 11:42 AM Retta Diones, RN Rehab Admission Coordinator Share   View Details Report

## 2015-11-03 NOTE — Progress Notes (Signed)
IV access accidentally removed by patient - spoke to [place], RN receiving patient stated to transport patient without access. Madlyn Frankel, RN

## 2015-11-03 NOTE — PMR Pre-admission (Signed)
[employer] PMR Admission Coordinator Pre-Admission Assessment  Patient: Austin Holmes is an 45 y.o., male MRN: [id number] DOB: 1971/04/02 Height: _0  (185.4 cm) Weight: 98.34 kg (216 lb 12.8 oz)  Insurance Information No insurance - self pay  Medicaid Application Date:        Case Manager:   Disability Application Date:        Case Worker:    Emergency Contact Information Contact Information    Name Relation Home Work Mobile   [name],[name] Mother [phone]  [phone]      Current Medical History  Patient Admitting Diagnosis:  Left medial temporal thalamic infarct  History of Present Illness: a 45 y.o. male with no significant past medical history presented to the emergency room with the weakness in the right upper and lower extremity. The above complaint started yesterday evening after patient came back from work. Patient works in a [employer]. No history of any seizure. No history of any headache. He also had some numbness initially but in the emergency room he was able to move his right upper extremity and lower extremity with out any difficulty. No numbness or any tingling sensation in the emergency room. No complaints of any slurred speech or difficulty swallowing food. No history of any facial droop. No history of any stroke in the past. Patient was worked up with a CT head which showed no acute intracranial abnormality. Telemetry neurology consult was also done and patient is out of the TPA window, recommended MRI brain and a carotid ultrasound study and to start aspirin. Was admitted to medical floor with tele. No Afib/flutter on Tele. MRI showed acute left cva left temporal and thalamus.  Carotids normal.  Echo showed EF 25-30%. No symptoms. Started on Toprol XL 25 mg daily.  Will need routine cardiology f/u as OP. Will need ACE i added by PCP or Cardiology if BP tolerates.  Symptoms of right weakness improving with therapy recommendations for inpatient  rehab.  Patient's medical record from [facility] has been reviewed by the rehabilitation admission coordinator and physician.  NIH Stroke scale: 4  Past Medical History  History reviewed. No pertinent past medical history.  Family History   family history includes Diabetes Mellitus II in his maternal grandfather and maternal grandmother.  Prior Rehab/Hospitalizations Has the patient had major surgery during 100 days prior to admission? No    Current Medications See MAR from [facility]  Patients Current Diet:   Regular diet, thin liquids  Precautions / Restrictions Precautions Precautions: Fall Restrictions Weight Bearing Restrictions: No   Has the patient had 2 or more falls or a fall with injury in the past year?No  Prior Activity Level Community (5-7x/wk): Went out daily.  Worked fulltime at [employer]  Prior Functional Level Self Care: Did the patient need help bathing, dressing, using the toilet or eating?  Independent  Indoor Mobility: Did the patient need assistance with walking from room to room (with or without device)? Independent  Stairs: Did the patient need assistance with internal or external stairs (with or without device)? Independent  Functional Cognition: Did the patient need help planning regular tasks such as shopping or remembering to take medications? Independent  Home Assistive Devices / Equipment Home Assistive Devices/Equipment: None Home Equipment: None  Prior Device Use: Indicate devices/aids used by the patient prior to current illness, exacerbation or injury? None   Prior Functional Level Current Functional Level  Bed Mobility  Independent  Mod assist  Transfers  Independent  Mod assist   Mobility - Walk/Wheelchair  Independent  Mod assist (Ambulated 25 feet no device.)   Upper Body Dressing  Independent  Mod assist   Lower Body Dressing  Independent  Other (Not tried.)   Grooming   Independent  Min assist   Eating/Drinking  Independent  Min assist   Toilet Transfer  Independent  Min assist   Bladder Continence   WDL  Continent.  Voiding in urinal and BRP with help   Bowel Management  WDL  Reports last BM 11/02/15   Stair Climbing     Other (Not tried.)   Communication  Intact  WDL   Memory  Intact  Intact   Cooking/Meal Prep  Independent      Housework  Independent    Money Management  Independent    Driving  Independent     Special needs/care consideration BiPAP/CPAP No CPM No Continuous Drip IV 0.9% NS 75 mL/hr Dialysis No        Life Vest No Oxygen No Special Bed No Trach Size No Wound Vac (area) No     Skin No                           Bowel mgmt: Patient reports BM last evening 11/02/15 Bladder mgmt: Voiding in urinal and BRP with assist Diabetic mgmt No  Previous Home Environment Living Arrangements: Parent Available Help at Discharge: Family Type of Home: House Home Layout: One level Home Access: Stairs to enter Entrance Stairs-Rails: Can reach both Entrance Stairs-Number of Steps: 2 Bathroom Shower/Tub: Tub/shower unit, Architectural technologist: [place]: No  Discharge Living Setting Plans for Discharge Living Setting: House, Lives with (comment) (Lives with mom.) Type of Home at Discharge: House Discharge Home Layout: One level Discharge Home Access: Stairs to enter Entrance Stairs-Number of Steps: 2 steps Does the patient have any problems obtaining your medications?: No  Social/Family/Support Systems Patient Roles: Other (Comment) (Has a mom.) Contact Information: [name] - mother Anticipated Caregiver: mother Anticipated Caregiver's Contact Information: [name] - mom (h) [phone] (c) [phone] Ability/Limitations of Caregiver: Mom is retired and can assist. Building control surveyor Availability: 24/7 Discharge Plan Discussed with Primary Caregiver: Yes Is Caregiver In Agreement with  Plan?: Yes Does Caregiver/Family have Issues with Lodging/Transportation while Pt is in Rehab?: No  Goals/Additional Needs Patient/Family Goal for Rehab: PT/OT supervision and min assist, ST mod I and supervision Expected length of stay: 7-10 days Cultural Considerations: None Dietary Needs: Regular diet, thin liquids Equipment Needs: TBD Pt/Family Agrees to Admission and willing to participate: Yes Program Orientation Provided & Reviewed with Pt/Caregiver Including Roles  & Responsibilities: Yes  Patient Condition: I have met with patient and interviewed him and his mother.  Patient suffered a LCVA.  He was previously independent and working.  He was evaluated by PT and OT with inpatient rehab recommendations.  Can benefit from ST evaluation to address impulsivity and higher level problem solving.  Patient can benefit and tolerate our inpatient rehab program where he will receive 3 hours of therapy a day.  I have reviewed and discussed findings with rehab MD and have approval for acute inpatient rehab admission for today.  Preadmission Screen Completed By:  Retta Diones, 11/03/2015 11:25 AM ______________________________________________________________________   Discussed status with Dr. Posey Pronto on 11/03/15 at 1142 and received telephone approval for admission today.  Admission Coordinator:  Retta Diones, time 1142/Date 11/03/15   Assessment/Plan:  Diagnosis: L medial temporal lob and L thalamic infart 1. Does the need for close, 24 hr/day  Medical supervision in concert with the patient's rehab needs make it unreasonable for this patient to be served in a less intensive setting? Yes 2. Co-Morbidities requiring supervision/potential complications: thrombocytopenia, systolic CHF 3. Due to safety, disease management and patient education, does the patient require 24 hr/day rehab nursing? Yes 4. Does the patient require coordinated care of a physician, rehab nurse, PT (1-2 hrs/day, 5  days/week) and OT (1-2 hrs/day, 5 days/week) to address physical and functional deficits in the context of the above medical diagnosis(es)? Potentially Addressing deficits in the following areas: balance, endurance, locomotion, strength, transferring, toileting and psychosocial support 5. Can the patient actively participate in an intensive therapy program of at least 3 hrs of therapy 5 days a week? Yes 6. The potential for patient to make measurable gains while on inpatient rehab is excellent 7. Anticipated functional outcomes upon discharge from inpatients are: supervision PT, modified independent and supervision OT, n/a SLP 8. Estimated rehab length of stay to reach the above functional goals is: 14-18 days. 9. Does the patient have adequate social supports to accommodate these discharge functional goals? Potentially 10. Anticipated D/C setting: Home 11. Anticipated post D/C treatments: HH therapy and Home excercise program 12. Overall Rehab/Functional Prognosis: good    RECOMMENDATIONS: This patient's condition is appropriate for continued rehabilitative care in the following setting: CIR Patient has agreed to participate in recommended program. Yes Note that insurance prior authorization may be required for reimbursement for recommended care.  Retta Diones 11/03/2015  Delice Lesch, MD 11/03/15

## 2015-11-03 NOTE — H&P (View-Only) (Signed)
  Physical Medicine and Rehabilitation Admission H&P    Chief Complaint  Patient presents with  . Weakness and numbness right side with difficulty walking   HPI:  Austin Holmes is a 45 y.o. male in relatively good health who was admitted on to [facility] on 11/01/2015 with HA, right sided weakness and progressive difficulty walking.  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 2D echo with severe reduction in systolic funciton with diffuse hypokinesis and EF 20-25% with trivial AVR. 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 Dr. Reynold evaluated patient and recommended full strength ASA for stroke prevention and hypercoagulopathy work up--labs pending.     Review of Systems  Constitutional: Negative for fever and chills.  Eyes: Negative for blurred vision and double vision.  Gastrointestinal: Negative for nausea.  Neurological: Negative for dizziness, speech change, focal weakness and weakness.  All other systems reviewed and are negative.     History reviewed. No pertinent past medical history.    Past Surgical History  Procedure Laterality Date  . None      Family History  Problem Relation Age of Onset  . Diabetes Mellitus II Maternal Grandmother   . Diabetes Mellitus II Maternal Grandfather   . Cancer Father     pancreatic    Social History:  Single. 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  that he has never smoked. He does not have any smokeless tobacco history on file. He reports that he drinks 3-4 beers daily.  He reports that he does not use illicit drugs.    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  Functional Status:  Mobility: Bed Mobility General bed mobility comments: seated in recliner beginning/end of session Transfers Overall transfer level: Needs assistance Equipment used: None Transfers: Sit to/from Stand Sit to Stand: Min assist, Mod assist General transfer comment: Broad BOS with R lateral lean during movement transition, assist to prevent LOB.  Impulsive; poor insight into deficits Ambulation/Gait Ambulation/Gait assistance: Mod assist, +2 physical assistance Ambulation Distance (Feet): 25 Feet Assistive device: None General Gait Details: step to gait pattern with mod R lateral lean throughout gait cycle; R LE step height/length, foot placement inconsistent with occasional adduct in stance.  Excessive R abdominal flaring, R ant pelvic tilt with R genu recurvatum in stance (poor control without constant cuing).  Very impulsive    ADL: ADL Overall ADL's : Needs assistance/impaired Eating/Feeding: Set up (Pt. is using his Left nondominant hand to complete. MinA with right) Grooming: Minimal assistance (With right hand secondary to incoordination.) Upper Body Dressing : Moderate assistance Toilet Transfer Details (indicate cue type and reason): MinA for functional transfers with RW with verbal cues.  Cognition: Cognition Overall Cognitive Status: Impaired/Different from baseline Orientation Level: Oriented X4 Cognition Arousal/Alertness: Awake/alert Behavior During Therapy: WFL for tasks assessed/performed Overall Cognitive Status: Impaired/Different from    baseline General Comments: Pt. requires increased time for processing directions.    Blood pressure 122/81, pulse 93, temperature 98.8 F (37.1 C), temperature source Oral, resp. rate 18, height 6' 1" (1.854 m), weight 98.34 kg (216 lb 12.8 oz), SpO2 100 %. Physical Exam  Vitals reviewed. Constitutional: He is oriented to person, place, and time. He appears well-developed and well-nourished.  HENT:  Head: Normocephalic and atraumatic.  Eyes: Conjunctivae and EOM are normal.  Neck: Normal range of motion. Neck supple.  Cardiovascular: Normal rate and regular rhythm.   Respiratory: Effort normal and breath sounds normal.  GI: Soft. Bowel sounds are normal.  Musculoskeletal: He exhibits no edema or tenderness.  Neurological: He is alert and oriented to person, place, and time. He has normal reflexes.  Sensation intact to light touch Motor: 5/5 throughout  Skin: Skin is warm and dry.  Psychiatric: His behavior is normal. His mood appears anxious. He expresses impulsivity.    Results for orders placed or performed during the hospital encounter of 11/01/15 (from the past 48 hour(s))  Glucose, capillary     Status: None   Collection Time: 11/01/15  5:10 PM  Result Value Ref Range   Glucose-Capillary 79 65 - 99 mg/dL  Basic metabolic panel     Status: Abnormal   Collection Time: 11/01/15  6:53 PM  Result Value Ref Range   Sodium 135 135 - 145 mmol/L   Potassium 3.5 3.5 - 5.1 mmol/L   Chloride 102 101 - 111 mmol/L   CO2 24 22 - 32 mmol/L   Glucose, Bld 102 (H) 65 - 99 mg/dL   BUN 21 (H) 6 - 20 mg/dL   Creatinine, Ser 1.21 0.61 - 1.24 mg/dL   Calcium 9.5 8.9 - 10.3 mg/dL   GFR calc non Af Amer >60 >60 mL/min   GFR calc Af Amer >60 >60 mL/min    Comment: (NOTE) The eGFR has been calculated using the CKD EPI equation. This calculation has not been validated in all clinical situations. eGFR's persistently <60 mL/min signify possible Chronic Kidney Disease.    Anion gap 9 5 - 15    CBC with Differential     Status: Abnormal   Collection Time: 11/01/15  6:53 PM  Result Value Ref Range   WBC 4.6 3.8 - 10.6 K/uL   RBC 4.73 4.40 - 5.90 MIL/uL   Hemoglobin 13.8 13.0 - 18.0 g/dL   HCT 41.2 40.0 - 52.0 %   MCV 87.1 80.0 - 100.0 fL   MCH 29.2 26.0 - 34.0 pg   MCHC 33.5 32.0 - 36.0 g/dL   RDW 15.5 (H) 11.5 - 14.5 %   Platelets 146 (L) 150 - 440 K/uL   Neutrophils Relative % 81 %   Neutro Abs 3.7 1.4 - 6.5 K/uL   Lymphocytes Relative 13 %   Lymphs Abs 0.6 (L) 1.0 - 3.6 K/uL   Monocytes Relative 6 %   Monocytes Absolute 0.3 0.2 - 1.0 K/uL   Eosinophils Relative 0 %   Eosinophils Absolute 0.0 0 - 0.7 K/uL   Basophils Relative 0 %   Basophils Absolute 0.0 0 - 0.1 K/uL  Troponin I     Status: None   Collection Time: 11/01/15  6:53 PM  Result Value Ref Range   Troponin I <0.03 <0.031 ng/mL    Comment:        NO INDICATION OF MYOCARDIAL INJURY.   Troponin I (q 6hr x 3)     Status: None     Collection Time: 11/02/15  1:20 AM  Result Value Ref Range   Troponin I <0.03 <0.031 ng/mL    Comment:        NO INDICATION OF MYOCARDIAL INJURY.   Troponin I (q 6hr x 3)     Status: None   Collection Time: 11/02/15  5:57 AM  Result Value Ref Range   Troponin I <0.03 <0.031 ng/mL    Comment:        NO INDICATION OF MYOCARDIAL INJURY.   Lipid panel     Status: Abnormal   Collection Time: 11/02/15  5:57 AM  Result Value Ref Range   Cholesterol 202 (H) 0 - 200 mg/dL   Triglycerides 37 <150 mg/dL   HDL 91 >40 mg/dL   Total CHOL/HDL Ratio 2.2 RATIO   VLDL 7 0 - 40 mg/dL   LDL Cholesterol 104 (H) 0 - 99 mg/dL    Comment:        Total Cholesterol/HDL:CHD Risk Coronary Heart Disease Risk Table                     Men   Women  1/2 Average Risk   3.4   3.3  Average Risk       5.0   4.4  2 X Average Risk   9.6   7.1  3 X Average Risk  23.4   11.0        Use the calculated Patient Ratio above and the CHD Risk Table to determine the patient's CHD Risk.        ATP III  CLASSIFICATION (LDL):  <100     mg/dL   Optimal  100-129  mg/dL   Near or Above                    Optimal  130-159  mg/dL   Borderline  160-189  mg/dL   High  >190     mg/dL   Very High   CBC     Status: Abnormal   Collection Time: 11/02/15  5:57 AM  Result Value Ref Range   WBC 3.9 3.8 - 10.6 K/uL   RBC 4.90 4.40 - 5.90 MIL/uL   Hemoglobin 14.0 13.0 - 18.0 g/dL   HCT 41.5 40.0 - 52.0 %   MCV 84.7 80.0 - 100.0 fL   MCH 28.7 26.0 - 34.0 pg   MCHC 33.8 32.0 - 36.0 g/dL   RDW 15.2 (H) 11.5 - 14.5 %   Platelets 147 (L) 150 - 440 K/uL  Creatinine, serum     Status: None   Collection Time: 11/02/15  5:57 AM  Result Value Ref Range   Creatinine, Ser 1.05 0.61 - 1.24 mg/dL   GFR calc non Af Amer >60 >60 mL/min   GFR calc Af Amer >60 >60 mL/min    Comment: (NOTE) The eGFR has been calculated using the CKD EPI equation. This calculation has not been validated in all clinical situations. eGFR's persistently <60 mL/min signify possible Chronic Kidney Disease.   Troponin I (q 6hr x 3)     Status: None   Collection Time: 11/02/15 12:44 PM  Result Value Ref Range   Troponin I <0.03 <0.031 ng/mL    Comment:        NO INDICATION OF MYOCARDIAL INJURY.   Sedimentation rate     Status: None   Collection Time: 11/02/15  5:05 PM  Result Value Ref Range   Sed Rate 4 0 - 15  mm/hr  Hemoglobin A1c     Status: None   Collection Time: 11/02/15  5:05 PM  Result Value Ref Range   Hgb A1c MFr Bld 5.0 4.0 - 6.0 %   Ct Angio Head W/cm &/or Wo Cm  11/01/2015  ADDENDUM REPORT: 11/01/2015 20:36 ADDENDUM: Study discussed by telephone with Dr. PHILLIP STAFFORD on 11/01/2015 at 2030 hours. Electronically Signed   By: H  Hall M.D.   On: 11/01/2015 20:36  11/01/2015  CLINICAL DATA:  45-year-old male with unsteady gait today, blurred vision, right lower extremity paresthesia. Initial encounter. EXAM: CT ANGIOGRAPHY HEAD AND NECK TECHNIQUE: Multidetector CT imaging of the head and neck was performed using the  standard protocol during bolus administration of intravenous contrast. Multiplanar CT image reconstructions and MIPs were obtained to evaluate the vascular anatomy. Carotid stenosis measurements (when applicable) are obtained utilizing NASCET criteria, using the distal internal carotid diameter as the denominator. CONTRAST:  75 mL Isovue 370 COMPARISON:  Head CT without contrast 1636 hours today. FINDINGS: CTA NECK Skeleton:  No acute osseous abnormality identified. Mild paranasal sinus mucosal thickening. Sphenoid sinuses are spared. Tympanic cavities and mastoids are clear. Other neck: Negative lung apices. No superior mediastinal lymphadenopathy. Thyroid, larynx, pharynx, parapharyngeal spaces, retropharyngeal space, sublingual space, submandibular glands, and parotid glands are within normal limits. No cervical lymphadenopathy. Aortic arch: 3 vessel arch configuration. No arch atherosclerosis or great vessel origin stenosis. Right carotid system: Negative. Left carotid system: Negative. Vertebral arteries:Negative; no proximal subclavian artery stenosis, normal vertebral artery origins and no vertebral artery stenosis in the neck. The left vertebral artery is non dominant. CTA HEAD Posterior circulation: Mildly dominant distal right vertebral artery. Normal PICA origins. No distal vertebral artery stenosis. Patent vertebrobasilar junction. No basilar stenosis. Normal SCA and PCA origins. Both posterior communicating arteries are present, the left is diminutive. There is a 6 mm segment of occlusion in the distal left PCA P2 segment. There is reconstituted flow distally. This is best seen on series 11, image 21. Right PCA branches are within normal limits. Anterior circulation: Both ICA siphons are patent. Both ophthalmic and posterior communicating artery origins are within normal limits. Both carotid termini are patent. The left ACA is non dominant. Anterior communicating artery and ACA branches are within  normal limits. Left MCA M1 segment, bifurcation, and left MCA branches are within normal limits. Right MCA M1 segment, bifurcation, and right MCA branches are within normal limits. Venous sinuses: Patent. Anatomic variants: Non dominant left vertebral artery and left ACA. Delayed phase: No abnormal enhancement identified. No cortically based acute infarct identified. No intracranial hemorrhage or mass effect. IMPRESSION: 1. Negative for emergent large vessel occlusion but Positive for segmental Occlusion vs. High-grade Stenosis in the Left PCA P2 segment. There is reconstituted distal left PCA flow. See series 11, image 21. 2. Stable and negative CT appearance of the brain. 3. No atherosclerosis or stenosis in the neck. Negative anterior circulation. Electronically Signed: By: H  Hall M.D. On: 11/01/2015 20:27   Ct Head Wo Contrast  11/01/2015  CLINICAL DATA:  Right leg numbness since yesterday EXAM: CT HEAD WITHOUT CONTRAST TECHNIQUE: Contiguous axial images were obtained from the base of the skull through the vertex without intravenous contrast. COMPARISON:  None. FINDINGS: No acute intracranial abnormality. Specifically, no hemorrhage, hydrocephalus, mass lesion, acute infarction, or significant intracranial injury. No acute calvarial abnormality. Visualized paranasal sinuses and mastoids clear. Orbital soft tissues unremarkable. IMPRESSION: No acute intracranial abnormality. Electronically Signed   By: Kevin  Dover M.D.     On: 11/01/2015 16:40   Ct Angio Neck W/cm &/or Wo/cm  11/01/2015  ADDENDUM REPORT: 11/01/2015 20:36 ADDENDUM: Study discussed by telephone with Dr. PHILLIP STAFFORD on 11/01/2015 at 2030 hours. Electronically Signed   By: H  Hall M.D.   On: 11/01/2015 20:36  11/01/2015  CLINICAL DATA:  45-year-old male with unsteady gait today, blurred vision, right lower extremity paresthesia. Initial encounter. EXAM: CT ANGIOGRAPHY HEAD AND NECK TECHNIQUE: Multidetector CT imaging of the head and neck was  performed using the standard protocol during bolus administration of intravenous contrast. Multiplanar CT image reconstructions and MIPs were obtained to evaluate the vascular anatomy. Carotid stenosis measurements (when applicable) are obtained utilizing NASCET criteria, using the distal internal carotid diameter as the denominator. CONTRAST:  75 mL Isovue 370 COMPARISON:  Head CT without contrast 1636 hours today. FINDINGS: CTA NECK Skeleton:  No acute osseous abnormality identified. Mild paranasal sinus mucosal thickening. Sphenoid sinuses are spared. Tympanic cavities and mastoids are clear. Other neck: Negative lung apices. No superior mediastinal lymphadenopathy. Thyroid, larynx, pharynx, parapharyngeal spaces, retropharyngeal space, sublingual space, submandibular glands, and parotid glands are within normal limits. No cervical lymphadenopathy. Aortic arch: 3 vessel arch configuration. No arch atherosclerosis or great vessel origin stenosis. Right carotid system: Negative. Left carotid system: Negative. Vertebral arteries:Negative; no proximal subclavian artery stenosis, normal vertebral artery origins and no vertebral artery stenosis in the neck. The left vertebral artery is non dominant. CTA HEAD Posterior circulation: Mildly dominant distal right vertebral artery. Normal PICA origins. No distal vertebral artery stenosis. Patent vertebrobasilar junction. No basilar stenosis. Normal SCA and PCA origins. Both posterior communicating arteries are present, the left is diminutive. There is a 6 mm segment of occlusion in the distal left PCA P2 segment. There is reconstituted flow distally. This is best seen on series 11, image 21. Right PCA branches are within normal limits. Anterior circulation: Both ICA siphons are patent. Both ophthalmic and posterior communicating artery origins are within normal limits. Both carotid termini are patent. The left ACA is non dominant. Anterior communicating artery and ACA  branches are within normal limits. Left MCA M1 segment, bifurcation, and left MCA branches are within normal limits. Right MCA M1 segment, bifurcation, and right MCA branches are within normal limits. Venous sinuses: Patent. Anatomic variants: Non dominant left vertebral artery and left ACA. Delayed phase: No abnormal enhancement identified. No cortically based acute infarct identified. No intracranial hemorrhage or mass effect. IMPRESSION: 1. Negative for emergent large vessel occlusion but Positive for segmental Occlusion vs. High-grade Stenosis in the Left PCA P2 segment. There is reconstituted distal left PCA flow. See series 11, image 21. 2. Stable and negative CT appearance of the brain. 3. No atherosclerosis or stenosis in the neck. Negative anterior circulation. Electronically Signed: By: H  Hall M.D. On: 11/01/2015 20:27   Mr Brain Wo Contrast  11/02/2015  CLINICAL DATA:  45-year-old male with un steady gait, blurred vision right lower extremity paresthesias. Subsequent encounter. EXAM: MRI HEAD WITHOUT CONTRAST MRA HEAD WITHOUT CONTRAST TECHNIQUE: Multiplanar, multiecho pulse sequences of the brain and surrounding structures were obtained without intravenous contrast. Angiographic images of the head were obtained using MRA technique without contrast. COMPARISON:  CT angiogram 11/01/2015. 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 No intracranial hemorrhage. Very mild periventricular white matter changes may be related to result of small vessel disease. No age advanced atrophy or hydrocephalus. No intracranial mass lesion noted on this unenhanced exam. Decreased signal intensity of bone   marrow (most notable upper cervical spine) may be related to patient's habitus. Correlation with CBC to exclude anemia contributing to this appearance may be considered Cervical medullary junction, pituitary region, pineal region and orbital structures  unremarkable. Junction Mild paranasal sinus mucosal thickening. MRA HEAD FINDINGS Narrowing of the cavernous segment of the internal carotid artery greater on left. This is felt to represent overestimation of degree of narrowing given the appearance on recent CT angiogram. Mild irregularity M1 segment middle cerebral artery bilaterally. Minimal bulge superior margin felt to be origin of a vessel rather than aneurysm. Moderate narrowing M2 segment /middle cerebral artery branches bilaterally. Mild to moderate narrowing A1 segment left anterior cerebral artery. Moderate to marked narrowing A2 segment left anterior cerebral artery. Mild irregularity with minimal to mild narrowing portions of right anterior cerebral artery. Moderate narrowing distal vertebral arteries more notable on the left. Nonvisualized posterior inferior cerebellar arteries and anterior inferior cerebellar arteries bilaterally. Mild to slightly moderate narrowing and irregularity basilar artery. 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 Mild to moderate narrowing of portions of the right posterior cerebral artery. Moderate narrowing superior cerebellar artery bilaterally. 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 Decreased signal intensity of bone marrow (most notable upper cervical spine) may be related to patient's habitus. Correlation with CBC to exclude anemia contributing to this appearance may be considered MRA HEAD MR angiogram appears to overestimate degree of narrowing given the appearance on recent CT angiogram as detailed above. 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 Electronically Signed   By: Genia Del M.D.   On: 11/02/2015 11:54   Mr Austin Holmes Head/brain Wo Cm  11/02/2015  CLINICAL DATA:  45 year old male with un steady gait, blurred vision right lower extremity paresthesias. Subsequent encounter. EXAM: MRI HEAD WITHOUT  CONTRAST MRA HEAD WITHOUT CONTRAST TECHNIQUE: Multiplanar, multiecho pulse sequences of the brain and surrounding structures were obtained without intravenous contrast. Angiographic images of the head were obtained using MRA technique without contrast. COMPARISON:  CT angiogram 11/01/2015. 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 No intracranial hemorrhage. Very mild periventricular white matter changes may be related to result of small vessel disease. No age advanced atrophy or hydrocephalus. No intracranial mass lesion noted on this unenhanced exam. Decreased signal intensity of bone marrow (most notable upper cervical spine) may be related to patient's habitus. Correlation with CBC to exclude anemia contributing to this appearance may be considered Cervical medullary junction, pituitary region, pineal region and orbital structures unremarkable. Junction Mild paranasal sinus mucosal thickening. MRA HEAD FINDINGS Narrowing of the cavernous segment of the internal carotid artery greater on left. This is felt to represent overestimation of degree of narrowing given the appearance on recent CT angiogram. Mild irregularity M1 segment middle cerebral artery bilaterally. Minimal bulge superior margin felt to be origin of a vessel rather than aneurysm. Moderate narrowing M2 segment /middle cerebral artery branches bilaterally. Mild to moderate narrowing A1 segment left anterior cerebral artery. Moderate to marked narrowing A2 segment left anterior cerebral artery. Mild irregularity with minimal to mild narrowing portions of right anterior cerebral artery. Moderate narrowing distal vertebral arteries more notable on the left. Nonvisualized posterior inferior cerebellar arteries and anterior inferior cerebellar arteries bilaterally. Mild to slightly moderate narrowing and irregularity basilar artery. Occlusion of the left posterior cerebral artery just beyond P1  segment. Mild to moderate narrowing of portions of the right posterior  cerebral artery. Moderate narrowing superior cerebellar artery bilaterally. 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 Decreased signal intensity of bone marrow (most notable upper cervical spine) may be related to patient's habitus. Correlation with CBC to exclude anemia contributing to this appearance may be considered MRA HEAD MR angiogram appears to overestimate degree of narrowing given the appearance on recent CT angiogram as detailed above. 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 Electronically Signed   By: Steven  Olson M.D.   On: 11/02/2015 11:54   Medical Problem List and Plan: 1.  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  Cont meds 2.  DVT Prophylaxis/Anticoagulation: Mechanical: Sequential compression devices, below knee Bilateral lower extremities Pharmaceutical: Lovenox 3. Pain Management: Tylenol PRN. 4. Mood: Environmental support. 5. Neuropsych: This patient is capable of making decisions on his own behalf. 6. Skin/Wound Care: Routine skin care. 7. Fluids/Electrolytes/Nutrition: Routine I/Os with follow up chemistries. 8. Dyslipidemia: Lipitor 9. Systolic CHF: Cont toprolol.  Will add ACE if BP permits.  Will need to follow up with Cardiology.     Post Admission Physician Evaluation: 1. Functional deficits secondary  to left medial temporal lobe and left thalamic infarct. 2. Patient is admitted to receive collaborative, interdisciplinary care between the physiatrist, rehab nursing staff, and therapy team. 3. Patient's level of medical complexity and substantial therapy needs in context of that medical necessity cannot be provided at a lesser intensity of care such as a SNF. 4. Patient has experienced substantial functional loss from his/her baseline which was documented  above under the "Functional History" and "Functional Status" headings.  Judging by the patient's diagnosis, physical exam, and functional history, the patient has potential for functional progress which will result in measurable gains while on inpatient rehab.  These gains will be of substantial and practical use upon discharge  in facilitating mobility and self-care at the household level. 5. Physiatrist will provide 24 hour management of medical needs as well as oversight of the therapy plan/treatment and provide guidance as appropriate regarding the interaction of the two. 6. 24 hour rehab nursing will assist with safety, disease management and patient education and help integrate therapy concepts, techniques,education, etc. 7. PT will assess and treat for/with: Lower extremity strength, range of motion, stamina, balance, functional mobility, safety, adaptive techniques and equipment, woundcare, coping skills, pain control, education.   Goals are: Supervision. 8. OT will assess and treat for/with: ADL's, functional mobility, safety, upper extremity strength, adaptive techniques and equipment, wound mgt, ego support, and community reintegration.   Goals are: Supervision/mod I. Therapy may proceed with showering this patient. 9. Case Management and Social Worker will assess and treat for psychological issues and discharge planning. 10. Team conference will be held weekly to assess progress toward goals and to determine barriers to discharge. 11. Patient will receive at least 3 hours of therapy per day at least 5 days per week. 12. ELOS: 14-18 days.       13. Prognosis:  good  Niani Mourer, MD  11/03/2015 

## 2015-11-03 NOTE — Progress Notes (Signed)
Patient transported to [facility] via [facility]. Madlyn Frankel, RN

## 2015-11-03 NOTE — Progress Notes (Signed)
Rehab admissions - I met with patient and his mother at the bedside this am.  They are interested in inpatient rehab admission.  Patient has no insurance.  He lives with his mom who is retired and can assist.  I gave them rehab brochures and I explained inpatient rehab process.  Bed available and will admit to acute inpatient rehab today.  Call me for questions.  #[phone]

## 2015-11-03 NOTE — Progress Notes (Signed)
*  PRELIMINARY RESULTS* Echocardiogram 2D Echocardiogram has been performed.  Austin Holmes 11/03/2015, 9:39 AM

## 2015-11-03 NOTE — Discharge Summary (Addendum)
[facility] [place] NAME: Austin Holmes    MR#:  [id number]  DATE OF BIRTH:  08/19/1970  DATE OF ADMISSION:  11/01/2015 ADMITTING PHYSICIAN: Saundra Shelling, MD  DATE OF DISCHARGE: No discharge date for patient encounter.  PRIMARY CARE PHYSICIAN: No primary care provider on file.   ADMISSION DIAGNOSIS:  TIA (transient ischemic attack) [G45.9] Paresthesia of lower extremity [R20.2] Acute ischemic left PCA stroke (HCC) [I63.532] Acute CVA (cerebrovascular accident) ([place]) [I63.9]  DISCHARGE DIAGNOSIS:  Principal Problem:   TIA (transient ischemic attack) Active Problems:   Acute CVA (cerebrovascular accident) ([place])   SECONDARY DIAGNOSIS:  History reviewed. No pertinent past medical history.   ADMITTING HISTORY  HISTORY OF PRESENT ILLNESS: Austin Holmes is a 45 y.o. male with no significant past medical history presented to the emergency room with the weakness in the right upper and lower extremity. The above complaint started yesterday evening after patient came back from work. Patient works in a [employer]. No history of any seizure. No history of any headache. He also had some numbness initially but in the emergency room he was able to move his right upper extremity and lower extremity with out any difficulty. No numbness or any tingling sensation in the emergency room. No complaints of any slurred speech or difficulty swallowing food. No history of any facial droop. No history of any stroke in the past. Patient was worked up with a CT head which showed no acute intracranial abnormality. Telemetry neurology consult was also done and patient is out of the TPA window, recommended MRI brain and a carotid ultrasound study and to start aspirin.  HOSPITAL COURSE:   1. 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 2. 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 Alcohol abuse  Admitted to  medical floor with tele. No Afib/flutter on Tele. MRI showed acute left cva left temporal and thalamus. Carotids normal. Echo showed EF 25-30%. No symptoms. Started on Toprol XL 25 mg daily. Will need routine cardiology f/u as OP. Will need ACE i added by PCP or Cardiology if BP tolerates.  Symptoms of right weakness improving but will need inpatient rehab. Seen by Neurology  ASA, Statin.  Stable for discharge to inpatient rehab.  CONSULTS OBTAINED:  Treatment Team:  Alexis Goodell, MD  DRUG ALLERGIES:  No Known Allergies  DISCHARGE MEDICATIONS:   Current Discharge Medication List    START taking these medications   Details  aspirin EC 81 MG tablet Take 1 tablet (81 mg total) by mouth daily. Qty: 60 tablet, Refills: 0    atorvastatin (LIPITOR) 40 MG tablet Take 1 tablet (40 mg total) by mouth daily at 6 PM. Qty: 90 tablet, Refills: 0    metoprolol succinate (TOPROL XL) 25 MG 24 hr tablet Take 1 tablet (25 mg total) by mouth daily. Qty: 30 tablet, Refills: 0        Today   VITAL SIGNS:  Blood pressure 122/81, pulse 93, temperature 98.8 F (37.1 C), temperature source Oral, resp. rate 18, height 6\' 1"  (1.854 m), weight 98.34 kg (216 lb 12.8 oz), SpO2 100 %.  I/O:   Intake/Output Summary (Last 24 hours) at 11/03/15 1119 Last data filed at 11/03/15 0900  Gross per 24 hour  Intake 1353.75 ml  Output      0 ml  Net 1353.75 ml    PHYSICAL EXAMINATION:  Physical Exam  GENERAL:  45 y.o.-year-old patient lying in the bed with  no acute distress.  LUNGS: Normal breath sounds bilaterally, no wheezing, rales,rhonchi or crepitation. No use of accessory muscles of respiration.  CARDIOVASCULAR: S1, S2 normal. No murmurs, rubs, or gallops.  ABDOMEN: Soft, non-tender, non-distended. Bowel sounds present. No organomegaly or mass.  NEUROLOGIC: Moves all 4 extremities. Right weakness PSYCHIATRIC: The patient is alert and oriented x 3.  SKIN: No obvious rash, lesion, or  ulcer.   DATA REVIEW:   CBC  Recent Labs Lab 11/02/15 0557  WBC 3.9  HGB 14.0  HCT 41.5  PLT 147*    Chemistries   Recent Labs Lab 11/01/15 1853 11/02/15 0557  NA 135  --   K 3.5  --   CL 102  --   CO2 24  --   GLUCOSE 102*  --   BUN 21*  --   CREATININE 1.21 1.05  CALCIUM 9.5  --     Cardiac Enzymes  Recent Labs Lab 11/02/15 [place] <0.03    Microbiology Results  No results found for this or any previous visit.  RADIOLOGY:  Ct Angio Head W/cm &/or Wo Cm  11/01/2015  ADDENDUM REPORT: 11/01/2015 20:36 ADDENDUM: Study discussed by telephone with Dr. Doren Custard STAFFORD on 11/01/2015 at 2030 hours. Electronically Signed   By: Genevie Ann M.D.   On: 11/01/2015 20:36  11/01/2015  CLINICAL DATA:  45 year old male with unsteady gait today, blurred vision, right lower extremity paresthesia. Initial encounter. EXAM: CT ANGIOGRAPHY HEAD AND NECK TECHNIQUE: Multidetector CT imaging of the head and neck was performed using the standard protocol during bolus administration of intravenous contrast. Multiplanar CT image reconstructions and MIPs were obtained to evaluate the vascular anatomy. Carotid stenosis measurements (when applicable) are obtained utilizing NASCET criteria, using the distal internal carotid diameter as the denominator. CONTRAST:  75 mL Isovue 370 COMPARISON:  Head CT without contrast 1636 hours today. FINDINGS: CTA NECK Skeleton:  No acute osseous abnormality identified. Mild paranasal sinus mucosal thickening. Sphenoid sinuses are spared. Tympanic cavities and mastoids are clear. Other neck: Negative lung apices. No superior mediastinal lymphadenopathy. Thyroid, larynx, pharynx, parapharyngeal spaces, retropharyngeal space, sublingual space, submandibular glands, and parotid glands are within normal limits. No cervical lymphadenopathy. Aortic arch: 3 vessel arch configuration. No arch atherosclerosis or great vessel origin stenosis. Right carotid system: Negative.  Left carotid system: Negative. Vertebral arteries:Negative; no proximal subclavian artery stenosis, normal vertebral artery origins and no vertebral artery stenosis in the neck. The left vertebral artery is non dominant. CTA HEAD Posterior circulation: Mildly dominant distal right vertebral artery. Normal PICA origins. No distal vertebral artery stenosis. Patent vertebrobasilar junction. No basilar stenosis. Normal SCA and PCA origins. Both posterior communicating arteries are present, the left is diminutive. There is a 6 mm segment of occlusion in the distal left PCA P2 segment. There is reconstituted flow distally. This is best seen on series 11, image 21. Right PCA branches are within normal limits. Anterior circulation: Both ICA siphons are patent. Both ophthalmic and posterior communicating artery origins are within normal limits. Both carotid termini are patent. The left ACA is non dominant. Anterior communicating artery and ACA branches are within normal limits. Left MCA M1 segment, bifurcation, and left MCA branches are within normal limits. Right MCA M1 segment, bifurcation, and right MCA branches are within normal limits. Venous sinuses: Patent. Anatomic variants: Non dominant left vertebral artery and left ACA. Delayed phase: No abnormal enhancement identified. No cortically based acute infarct identified. No intracranial hemorrhage or mass effect. IMPRESSION: 1. Negative for  emergent large vessel occlusion but Positive for segmental Occlusion vs. High-grade Stenosis in the Left PCA P2 segment. There is reconstituted distal left PCA flow. See series 11, image 21. 2. Stable and negative CT appearance of the brain. 3. No atherosclerosis or stenosis in the neck. Negative anterior circulation. Electronically Signed: By: Genevie Ann M.D. On: 11/01/2015 20:27   Ct Head Wo Contrast  11/01/2015  CLINICAL DATA:  Right leg numbness since yesterday EXAM: CT HEAD WITHOUT CONTRAST TECHNIQUE: Contiguous axial images were  obtained from the base of the skull through the vertex without intravenous contrast. COMPARISON:  None. FINDINGS: No acute intracranial abnormality. Specifically, no hemorrhage, hydrocephalus, mass lesion, acute infarction, or significant intracranial injury. No acute calvarial abnormality. Visualized paranasal sinuses and mastoids clear. Orbital soft tissues unremarkable. IMPRESSION: No acute intracranial abnormality. Electronically Signed   By: Rolm Baptise M.D.   On: 11/01/2015 16:40   Ct Angio Neck W/cm &/or Wo/cm  11/01/2015  ADDENDUM REPORT: 11/01/2015 20:36 ADDENDUM: Study discussed by telephone with Dr. Doren Custard STAFFORD on 11/01/2015 at 2030 hours. Electronically Signed   By: Genevie Ann M.D.   On: 11/01/2015 20:36  11/01/2015  CLINICAL DATA:  45 year old male with unsteady gait today, blurred vision, right lower extremity paresthesia. Initial encounter. EXAM: CT ANGIOGRAPHY HEAD AND NECK TECHNIQUE: Multidetector CT imaging of the head and neck was performed using the standard protocol during bolus administration of intravenous contrast. Multiplanar CT image reconstructions and MIPs were obtained to evaluate the vascular anatomy. Carotid stenosis measurements (when applicable) are obtained utilizing NASCET criteria, using the distal internal carotid diameter as the denominator. CONTRAST:  75 mL Isovue 370 COMPARISON:  Head CT without contrast 1636 hours today. FINDINGS: CTA NECK Skeleton:  No acute osseous abnormality identified. Mild paranasal sinus mucosal thickening. Sphenoid sinuses are spared. Tympanic cavities and mastoids are clear. Other neck: Negative lung apices. No superior mediastinal lymphadenopathy. Thyroid, larynx, pharynx, parapharyngeal spaces, retropharyngeal space, sublingual space, submandibular glands, and parotid glands are within normal limits. No cervical lymphadenopathy. Aortic arch: 3 vessel arch configuration. No arch atherosclerosis or great vessel origin stenosis. Right carotid  system: Negative. Left carotid system: Negative. Vertebral arteries:Negative; no proximal subclavian artery stenosis, normal vertebral artery origins and no vertebral artery stenosis in the neck. The left vertebral artery is non dominant. CTA HEAD Posterior circulation: Mildly dominant distal right vertebral artery. Normal PICA origins. No distal vertebral artery stenosis. Patent vertebrobasilar junction. No basilar stenosis. Normal SCA and PCA origins. Both posterior communicating arteries are present, the left is diminutive. There is a 6 mm segment of occlusion in the distal left PCA P2 segment. There is reconstituted flow distally. This is best seen on series 11, image 21. Right PCA branches are within normal limits. Anterior circulation: Both ICA siphons are patent. Both ophthalmic and posterior communicating artery origins are within normal limits. Both carotid termini are patent. The left ACA is non dominant. Anterior communicating artery and ACA branches are within normal limits. Left MCA M1 segment, bifurcation, and left MCA branches are within normal limits. Right MCA M1 segment, bifurcation, and right MCA branches are within normal limits. Venous sinuses: Patent. Anatomic variants: Non dominant left vertebral artery and left ACA. Delayed phase: No abnormal enhancement identified. No cortically based acute infarct identified. No intracranial hemorrhage or mass effect. IMPRESSION: 1. Negative for emergent large vessel occlusion but Positive for segmental Occlusion vs. High-grade Stenosis in the Left PCA P2 segment. There is reconstituted distal left PCA flow. See series 11, image 21.  2. Stable and negative CT appearance of the brain. 3. No atherosclerosis or stenosis in the neck. Negative anterior circulation. Electronically Signed: By: Genevie Ann M.D. On: 11/01/2015 20:27   Mr Brain Wo Contrast  11/02/2015  CLINICAL DATA:  45 year old male with un steady gait, blurred vision right lower extremity  paresthesias. Subsequent encounter. EXAM: MRI HEAD WITHOUT CONTRAST MRA HEAD WITHOUT CONTRAST TECHNIQUE: Multiplanar, multiecho pulse sequences of the brain and surrounding structures were obtained without intravenous contrast. Angiographic images of the head were obtained using MRA technique without contrast. COMPARISON:  CT angiogram 11/01/2015. 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 No intracranial hemorrhage. Very mild periventricular white matter changes may be related to result of small vessel disease. No age advanced atrophy or hydrocephalus. No intracranial mass lesion noted on this unenhanced exam. Decreased signal intensity of bone marrow (most notable upper cervical spine) may be related to patient's habitus. Correlation with CBC to exclude anemia contributing to this appearance may be considered Cervical medullary junction, pituitary region, pineal region and orbital structures unremarkable. Junction Mild paranasal sinus mucosal thickening. MRA HEAD FINDINGS Narrowing of the cavernous segment of the internal carotid artery greater on left. This is felt to represent overestimation of degree of narrowing given the appearance on recent CT angiogram. Mild irregularity M1 segment middle cerebral artery bilaterally. Minimal bulge superior margin felt to be origin of a vessel rather than aneurysm. Moderate narrowing M2 segment /middle cerebral artery branches bilaterally. Mild to moderate narrowing A1 segment left anterior cerebral artery. Moderate to marked narrowing A2 segment left anterior cerebral artery. Mild irregularity with minimal to mild narrowing portions of right anterior cerebral artery. Moderate narrowing distal vertebral arteries more notable on the left. Nonvisualized posterior inferior cerebellar arteries and anterior inferior cerebellar arteries bilaterally. Mild to slightly moderate narrowing and irregularity basilar artery.  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 Mild to moderate narrowing of portions of the right posterior cerebral artery. Moderate narrowing superior cerebellar artery bilaterally. 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 Decreased signal intensity of bone marrow (most notable upper cervical spine) may be related to patient's habitus. Correlation with CBC to exclude anemia contributing to this appearance may be considered MRA HEAD MR angiogram appears to overestimate degree of narrowing given the appearance on recent CT angiogram as detailed above. 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 Electronically Signed   By: Genia Del M.D.   On: 11/02/2015 11:54   Mr Jodene Nam Head/brain Wo Cm  11/02/2015  CLINICAL DATA:  45 year old male with un steady gait, blurred vision right lower extremity paresthesias. Subsequent encounter. EXAM: MRI HEAD WITHOUT CONTRAST MRA HEAD WITHOUT CONTRAST TECHNIQUE: Multiplanar, multiecho pulse sequences of the brain and surrounding structures were obtained without intravenous contrast. Angiographic images of the head were obtained using MRA technique without contrast. COMPARISON:  CT angiogram 11/01/2015. 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 No intracranial hemorrhage. Very mild periventricular white matter changes may be related to result of small vessel disease. No age advanced atrophy or hydrocephalus. No intracranial mass lesion noted on this unenhanced exam. Decreased signal intensity of bone marrow (most notable upper cervical spine) may be related to patient's habitus. Correlation with CBC to exclude anemia contributing to this appearance may be considered Cervical medullary junction, pituitary region, pineal region and orbital structures unremarkable. Junction Mild paranasal sinus mucosal  thickening. MRA  HEAD FINDINGS Narrowing of the cavernous segment of the internal carotid artery greater on left. This is felt to represent overestimation of degree of narrowing given the appearance on recent CT angiogram. Mild irregularity M1 segment middle cerebral artery bilaterally. Minimal bulge superior margin felt to be origin of a vessel rather than aneurysm. Moderate narrowing M2 segment /middle cerebral artery branches bilaterally. Mild to moderate narrowing A1 segment left anterior cerebral artery. Moderate to marked narrowing A2 segment left anterior cerebral artery. Mild irregularity with minimal to mild narrowing portions of right anterior cerebral artery. Moderate narrowing distal vertebral arteries more notable on the left. Nonvisualized posterior inferior cerebellar arteries and anterior inferior cerebellar arteries bilaterally. Mild to slightly moderate narrowing and irregularity basilar artery. 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 Mild to moderate narrowing of portions of the right posterior cerebral artery. Moderate narrowing superior cerebellar artery bilaterally. 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 Decreased signal intensity of bone marrow (most notable upper cervical spine) may be related to patient's habitus. Correlation with CBC to exclude anemia contributing to this appearance may be considered MRA HEAD MR angiogram appears to overestimate degree of narrowing given the appearance on recent CT angiogram as detailed above. 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 Electronically Signed   By: Genia Del M.D.   On: 11/02/2015 11:54    Follow up with PCP in 1 week.  Management plans discussed with the patient, family and they are in agreement.  CODE STATUS:     Code Status Orders        Start     Ordered   11/02/15 0151  Full code   Continuous     11/02/15 0151    Code  Status History    Date Active Date Inactive Code Status Order ID Comments User Context   This patient has a current code status but no historical code status.      TOTAL TIME TAKING CARE OF THIS PATIENT ON DAY OF DISCHARGE: more than 30 minutes.   Hillary Bow R M.D on 11/03/2015 at 11:19 AM  Between 7am to 6pm - Pager - [phone]  After 6pm go to [email or web address] - password [email or web address] [facility]  [facility]  Office  [phone]  CC: Primary care physician; No primary care provider on file.  Note: This dictation was prepared with Dragon dictation along with smaller phrase technology. Any transcriptional errors that result from this process are unintentional.

## 2015-11-03 NOTE — Evaluation (Signed)
Occupational Therapy Evaluation Patient Details Name: Austin Holmes MRN: [id number] DOB: September 14, 1970 Today's Date: 11/03/2015    History of Present Illness presented to ER with acute onset of R UE/LE weakness and sensory deficit; admitted with TIA vs. CVA.  Head CT negative for acute change; MRI significant for L medial temporal lob and L thalamic infart with occlusion of L PCA beyond P1 segment.   Clinical Impression   Pt. Is a 45 y.o. male who was admitted to [facility] for a CVA. Pt. Presents with limited RUE and hand motor control and coordination which hinder his ability to complete  ADL and IADL tasks using his dominant hand. Pt. Continues to benefit from skilled OT services to improve RUE and hand function for use during ADL and IADL tasks.     Follow Up Recommendations  OT followup services warranted. Possible Inpt. Rehab.    Equipment Recommendations       Recommendations for Other Services       Precautions / Restrictions Precautions Precautions: Fall Restrictions Weight Bearing Restrictions: No      Mobility Bed Mobility       Supervision supine to sit to EOB.            Transfers    MinA with RW with consistent Moderate cuing                  Balance Overall balance assessment: Needs assistance   Sitting balance-Leahy Scale: Good       Standing balance-Leahy Scale: Poor                              ADL Overall ADL's : Needs assistance/impaired Eating/Feeding: Set up (Pt. is using his Left nondominant hand to complete. MinA with right)   Grooming: Minimal assistance (With right hand secondary to incoordination.)           Upper Body Dressing : Moderate assistance         Toilet Transfer Details (indicate cue type and reason): MinA for functional transfers with RW with verbal cues.                 Vision     Perception     Praxis      Pertinent Vitals/Pain Pain Assessment: 0-10 Pain Score: 0-No pain      Hand Dominance Right   Extremity/Trunk Assessment Upper Extremity Assessment Upper Extremity Assessment: RUE deficits/detail (R shoulder flexion/abd. with 4/5, elbow flexion/extension, supination/pronation, wrist flexion/extension: 4+/5 . Pt. presents with impaired  RUE motor control and coordination.) RUE Deficits / Details: Intact sensation to light touch, and proprioceptive awareness.   Lower Extremity Assessment Lower Extremity Assessment: Overall WFL for tasks assessed       Communication Communication Communication: No difficulties   Cognition Arousal/Alertness: Awake/alert Behavior During Therapy: WFL for tasks assessed/performed Overall Cognitive Status: Impaired/Different from baseline                 General Comments: Pt. requires increased time for processing directions.   General Comments       Exercises       Shoulder Instructions      Home Living Family/patient expects to be discharged to:: Private residence Living Arrangements: Parent Available Help at Discharge: Family Type of Home: House Home Access: Stairs to enter Technical brewer of Steps: 2 Entrance Stairs-Rails: Can reach both Home Layout: One level     Bathroom Shower/Tub: Tub/shower unit;Curtain  Bathroom Toilet: Standard     Home Equipment: None          Prior Functioning/Environment Level of Independence: Independent        Comments: Independent with ADLs/IADLs, working full-time at a [employer].    OT Diagnosis: Generalized weakness   OT Problem List: Decreased strength;Decreased activity tolerance;Impaired UE functional use;Decreased knowledge of use of DME or AE;Decreased coordination   OT Treatment/Interventions: Self-care/ADL training;Therapeutic exercise;Neuromuscular education;DME and/or AE instruction;Therapeutic activities;Patient/family education    OT Goals(Current goals can be found in the care plan section) Acute Rehab OT Goals Patient  Stated Goal: To get back to doing what he was doing. Time For Goal Achievement: 11/17/15 Potential to Achieve Goals: Good  OT Frequency: Min 2X/week   Barriers to D/C:            Co-evaluation              End of Session Equipment Utilized During Treatment: Gait belt;Rolling walker  Activity Tolerance: Patient tolerated treatment well Patient left: in bed;with call bell/phone within reach;with bed alarm set;with family/visitor present   Time: [id number] OT Time Calculation (min): 40 min Charges:  OT General Charges $OT Visit: 1 Procedure OT Evaluation $OT Eval Moderate Complexity: 1 Procedure G-Codes: OT G-codes **NOT FOR INPATIENT CLASS** Functional Assessment Tool Used: Clinical judgement based on current functional status. Functional Limitation: Self care Self Care Current Status [phone]): At least 40 percent but less than 60 percent impaired, limited or restricted Self Care Goal Status [id number]): At least 20 percent but less than 40 percent impaired, limited or restricted  Harrel Carina, MS, OTR/L Harrel Carina 11/03/2015, 10:34 AM

## 2015-11-03 NOTE — Interval H&P Note (Signed)
Austin Holmes was admitted today to Inpatient Rehabilitation with the diagnosis of left medial temporal lobe and left thalamic infarct.  The patient's history has been reviewed, patient examined, and there is no change in status.  Patient continues to be appropriate for intensive inpatient rehabilitation.  I have reviewed the patient's chart and labs.  Questions were answered to the patient's satisfaction. The PAPE has been reviewed and assessment remains appropriate.  Ankit Lorie Phenix 11/03/2015, 9:24 PM

## 2015-11-03 NOTE — Evaluation (Signed)
Speech Language Pathology Evaluation Patient Details Name: Austin Holmes MRN: [id number] DOB: 12/25/1970 Today's Date: 11/03/2015 Time: [id number] SLP Time Calculation (min) (ACUTE ONLY): 45 min  Problem List:  Patient Active Problem List   Diagnosis Date Noted  . TIA (transient ischemic attack) 11/02/2015  . Acute CVA (cerebrovascular accident) ([place]) 11/02/2015   Past Medical History: History reviewed. No pertinent past medical history. Past Surgical History:  Past Surgical History  Procedure Laterality Date  . None     HPI:  Austin Holmes is a 45 y.o. male with no significant past medical history presented to the emergency room with the weakness in the right upper and lower extremity. The above complaint started yesterday evening after patient came back from work. Patient works in a [employer]. He currently lives with his mother, they pay bills jointly, and he purchases his own groceries. He doesn't drive or have a license to drive. No history of any seizure. No history of any headache. Pt with history of alcohol abuse. He also had some numbness initially but in the emergency room he was able to move his right upper extremity and lower extremity with out any difficulty. No numbness or any tingling sensation in the emergency room. No complaints of any slurred speech or difficulty swallowing food. No history of any facial droop. No history of any stroke in the past. Patient was worked up with a CT head which showed no acute intracranial abnormality. Telemetry neurology consult was also done and patient is out of the TPA window, recommended MRI brain and a carotid ultrasound study and to start aspirin presented to ER with acute onset of R UE/LE weakness and sensory deficit; admitted with TIA vs. CVA. Head CT negative for acute change; MRI significant for L medial temporal lobe and L thalamic infart with occlusion of L PCA beyond P1 segment.ST cognitive-linguistic evaluation  requested per report that pt has mild STM deficits during PT tasks such as sequencing information/steps.     Assessment / Plan / Recommendation Clinical Impression  Pt presents with mild cognitive impairments c/b decreased STM. Pt with score of 19/30 on MoCA (Version 7.1) with 22 considered average range. Pt currently exhibits some URE weakness/discoordination which may have affected his ability to effectively draw a cube and clock. Pt with difficulty with orientation information (date) but independently compensates by locating date on cell phone and stating it could also be found in newspaper (present at bedside). Pt's STM deficits may also manifest itself with higher level cognitive tasks such as medication management and finances. Pt able to maintain attention for all tasks and engage in complex conversation. Overall, pt with awareness of deficits and interested in further skilled services for higher level tasks.     SLP Assessment  All further Speech Lanaguage Pathology  needs can be addressed in the next venue of care    Follow Up Recommendations  Inpatient Rehab           SLP Evaluation Prior Functioning  Cognitive/Linguistic Baseline: Within functional limits Type of Home: House  Lives With: Family (Mother) Available Help at Discharge: Family Education: Completed High school Vocation: Full time employment   Cognition  Overall Cognitive Status: Impaired/Different from baseline Arousal/Alertness: Awake/alert Orientation Level: Oriented to person;Oriented to place;Oriented to situation Attention: Selective Selective Attention: Appears intact Memory: Impaired Memory Impairment: Decreased recall of new information Awareness: Appears intact Problem Solving: Appears intact Executive Function: Self Monitoring;Self Correcting Self Monitoring: Impaired Self Monitoring Impairment: Verbal complex;Functional complex Self Correcting:  Impaired Self Correcting Impairment: Verbal  complex;Functional complex Safety/Judgment: Appears intact    Comprehension  Auditory Comprehension Overall Auditory Comprehension: Appears within functional limits for tasks assessed Yes/No Questions: Within Functional Limits Commands: Within Functional Limits Conversation: Complex Interfering Components:  ( ) Visual Recognition/Discrimination Discrimination: Within Function Limits Reading Comprehension Reading Status: Within funtional limits    Expression Expression Primary Mode of Expression: Verbal Verbal Expression Overall Verbal Expression: Appears within functional limits for tasks assessed Initiation: No impairment Level of Generative/Spontaneous Verbalization: Conversation Repetition: No impairment Naming: No impairment Pragmatics: No impairment Non-Verbal Means of Communication: Not applicable Written Expression Dominant Hand: Right Written Expression: Not tested   Oral / Motor  Oral Motor/Sensory Function Overall Oral Motor/Sensory Function: Within functional limits Motor Speech Overall Motor Speech: Appears within functional limits for tasks assessed Respiration: Within functional limits Phonation: Normal Resonance: Within functional limits Articulation: Within functional limitis Intelligibility: Intelligible Motor Planning: Witnin functional limits Motor Speech Errors: Not applicable   GO                    Austin Holmes 11/03/2015, 12:44 PM

## 2015-11-03 NOTE — Progress Notes (Signed)
Report called to [facility]. Patient to be transported to Room 4W21. [facility] to transport. Madlyn Frankel, RN

## 2015-11-03 NOTE — Progress Notes (Signed)
Physical Therapy Treatment Patient Details Name: MILFRED ZEINER MRN: [id number] DOB: 20-Jul-1971 Today's Date: 11/03/2015    History of Present Illness presented to ER with acute onset of R UE/LE weakness and sensory deficit; admitted with TIA vs. CVA.  Head CT negative for acute change; MRI significant for L medial temporal lob and L thalamic infart with occlusion of L PCA beyond P1 segment.    PT Comments    Pt is making excellent progress towards goals with increased functional independence noted. Pt still presents with higher level judgement; not aware of deficits. Pt still presents with R UE/LE coordination deficits noted with finger opposition and heel slide test. Pt very impulsive with activity, needs RW for stability and balance as well as hands on assist for mobility to prevent falls. Treatment session limited as pt transferring to CIR this PM. Pt continues to be motivated to return to base line level and return to work. Continues to perserverate on R LE swelling. Pillow given to elevate extremity.   Follow Up Recommendations  CIR     Equipment Recommendations       Recommendations for Other Services       Precautions / Restrictions Precautions Precautions: Fall Restrictions Weight Bearing Restrictions: No    Mobility  Bed Mobility Overal bed mobility: Needs Assistance Bed Mobility: Supine to Sit     Supine to sit: Min assist     General bed mobility comments: Pt very impulsive with activity and tries to initiate movement prior to therapist instruction. Pt needs cues for hand placement for all mobility. Once seated at EOB, pt able to demonstrate upright posture.   Transfers Overall transfer level: Needs assistance Equipment used: Rolling walker (2 wheeled) Transfers: Sit to/from Stand Sit to Stand: Min assist         General transfer comment: Increased BOS, upright posture noted. Pt impulsive  Ambulation/Gait Ambulation/Gait assistance: Min  assist Ambulation Distance (Feet): 100 Feet Assistive device: Rolling walker (2 wheeled) Gait Pattern/deviations: Step-to pattern     General Gait Details: step to gait pattern with ataxic gait. RW used, however constant cues for sequencing and safety with ambulation. Pt cued for correct technique using rw. Pt with poor insight to deficits, needs cues to turn back to room secondary to fatigue.   Stairs            Wheelchair Mobility    Modified Rankin (Stroke Patients Only)       Balance                                    Cognition Arousal/Alertness: Awake/alert Behavior During Therapy: WFL for tasks assessed/performed Overall Cognitive Status: Impaired/Different from baseline                 General Comments: Pt. requires increased time for processing directions.    Exercises Other Exercises Other Exercises: Seated dynamic balance activities performed including pertabations in all directions with arms crossed against chest. Pt also able to perform supine/seated ther-ex x 12 reps on R LE including quad sets, hip abd/add. Seated at EOB, pt able to perform LAQ x 12 reps. All exercises performed with assist for correct technique as pt very impulsive. CGA applied    General Comments        Pertinent Vitals/Pain Pain Assessment: Faces Faces Pain Scale: Hurts a little bit Pain Location: R knee Pain Descriptors / Indicators: Tightness Pain Intervention(s): Repositioned (  complains of increased swelling)    Home Living                      Prior Function            PT Goals (current goals can now be found in the care plan section) Acute Rehab PT Goals Patient Stated Goal: To get back to doing what he was doing. PT Goal Formulation: With patient/family Time For Goal Achievement: 11/16/15 Potential to Achieve Goals: Good Progress towards PT goals: Progressing toward goals    Frequency  7X/week    PT Plan Current plan remains  appropriate    Co-evaluation             End of Session Equipment Utilized During Treatment: Gait belt Activity Tolerance: Patient tolerated treatment well Patient left: in bed;with bed alarm set     Time: 1356-1409 PT Time Calculation (min) (ACUTE ONLY): 13 min  Charges:  $Gait Training: 8-22 mins                    G Codes:      Gerry Heaphy 2015/11/21, 4:33 PM Greggory Stallion, PT, DPT [phone]

## 2015-11-03 NOTE — Care Management (Signed)
Discharge to Inpatient Acute Rehabilitation at [facility] today per Dr. Darvin Neighbours. [facility] will transport Shelbie Ammons RN MSN [place] [facility] [phone]

## 2015-11-04 ENCOUNTER — Inpatient Hospital Stay (HOSPITAL_COMMUNITY): Payer: Self-pay | Admitting: Occupational Therapy

## 2015-11-04 ENCOUNTER — Inpatient Hospital Stay (HOSPITAL_COMMUNITY): Payer: Self-pay | Admitting: Speech Pathology

## 2015-11-04 ENCOUNTER — Inpatient Hospital Stay (HOSPITAL_COMMUNITY): Payer: Self-pay | Admitting: Physical Therapy

## 2015-11-04 ENCOUNTER — Inpatient Hospital Stay (HOSPITAL_COMMUNITY): Payer: Self-pay

## 2015-11-04 DIAGNOSIS — M25451 Effusion, right hip: Secondary | ICD-10-CM

## 2015-11-04 DIAGNOSIS — Z789 Other specified health status: Secondary | ICD-10-CM

## 2015-11-04 DIAGNOSIS — G8191 Hemiplegia, unspecified affecting right dominant side: Secondary | ICD-10-CM

## 2015-11-04 LAB — CBC WITH DIFFERENTIAL/PLATELET
Basophils Absolute: 0 10*3/uL (ref 0.0–0.1)
Basophils Relative: 0 %
Eosinophils Absolute: 0 10*3/uL (ref 0.0–0.7)
Eosinophils Relative: 1 %
HCT: 42.2 % (ref 39.0–52.0)
Hemoglobin: 13.9 g/dL (ref 13.0–17.0)
Lymphocytes Relative: 44 %
Lymphs Abs: 1.5 10*3/uL (ref 0.7–4.0)
MCH: 28 pg (ref 26.0–34.0)
MCHC: 32.9 g/dL (ref 30.0–36.0)
MCV: 85.1 fL (ref 78.0–100.0)
Monocytes Absolute: 0.4 10*3/uL (ref 0.1–1.0)
Monocytes Relative: 11 %
Neutro Abs: 1.6 10*3/uL — ABNORMAL LOW (ref 1.7–7.7)
Neutrophils Relative %: 44 %
Platelets: 142 10*3/uL — ABNORMAL LOW (ref 150–400)
RBC: 4.96 MIL/uL (ref 4.22–5.81)
RDW: 14.3 % (ref 11.5–15.5)
WBC: 3.5 10*3/uL — ABNORMAL LOW (ref 4.0–10.5)

## 2015-11-04 LAB — COMPREHENSIVE METABOLIC PANEL
ALT: 42 U/L (ref 17–63)
AST: 40 U/L (ref 15–41)
Albumin: 3.6 g/dL (ref 3.5–5.0)
Alkaline Phosphatase: 32 U/L — ABNORMAL LOW (ref 38–126)
Anion gap: 11 (ref 5–15)
BUN: 10 mg/dL (ref 6–20)
CO2: 25 mmol/L (ref 22–32)
Calcium: 9.5 mg/dL (ref 8.9–10.3)
Chloride: 103 mmol/L (ref 101–111)
Creatinine, Ser: 1.23 mg/dL (ref 0.61–1.24)
GFR calc Af Amer: >60 mL/min (ref >60)
GFR calc non Af Amer: >60 mL/min (ref >60)
Glucose, Bld: 93 mg/dL (ref 65–99)
Potassium: 3.8 mmol/L (ref 3.5–5.1)
Sodium: 139 mmol/L (ref 135–145)
Total Bilirubin: 1 mg/dL (ref 0.3–1.2)
Total Protein: 7.1 g/dL (ref 6.5–8.1)

## 2015-11-04 NOTE — Progress Notes (Signed)
Patient information reviewed and entered into eRehab system by Lallie Strahm, RN, CRRN, PPS Coordinator.  Information including medical coding and functional independence measure will be reviewed and updated through discharge.    

## 2015-11-04 NOTE — Progress Notes (Signed)
[facility] Individual Statement of Services  Patient Name:  Austin Holmes  Date:  11/04/2015  Welcome to the [facility].  Our goal is to provide you with an individualized program based on your diagnosis and situation, designed to meet your specific needs.  With this comprehensive rehabilitation program, you will be expected to participate in at least 3 hours of rehabilitation therapies Monday-Friday, with modified therapy programming on the weekends.  Your rehabilitation program will include the following services:  Physical Therapy (PT), Occupational Therapy (OT), Speech Therapy (ST), 24 hour per day rehabilitation nursing, Case Management (Social Worker), Rehabilitation Medicine, Nutrition Services and Pharmacy Services  Weekly team conferences will be held on  to discuss your progress.  Your Social Worker will talk with you frequently to get your input and to update you on team discussions.  Team conferences with you and your family in attendance may also be held.  Expected length of stay:  5 to 7 days  Overall anticipated outcome:  Modified Independent with supervision with for community ambulation, cooking, medication management, and finances  Depending on your progress and recovery, your program may change. Your Social Worker will coordinate services and will keep you informed of any changes. Your Social Worker's name and contact numbers are listed  below.  The following services may also be recommended but are not provided by the [place] will be made to provide these services after discharge if needed.  Arrangements include referral to agencies that provide these services.  Your insurance has been verified to be:  none Your primary doctor is:  Complete paperwork to become a  patient at [facility] in [place]  Pertinent information will be shared with your doctor and your insurance company.  Social Worker:  Alfonse Alpers, LCSW  [phone] or [phone]  Information discussed with and copy given to patient by: [name], 11/04/2015, 1:52 PM

## 2015-11-04 NOTE — Consult Note (Signed)
CARDIOLOGY CONSULT NOTE   Patient ID: Austin Holmes MRN: [id number] DOB/AGE: Mar 11, 1971 45 y.o.  Admit date: 11/03/2015  Primary Physician   No primary care provider on file. Primary Cardiologist   New to Dr. Irish Lack Reason for Consultation   Low EF Requesting Physician  Dr. Letta Pate  HPI: Austin Holmes is a 45 y.o. male with no prior medical history who admitted [facility] rehab facility from [facility] due to stroke.   He was resented to [facility] 11/01/15 for Headache, right sided weakness and difficulty walking. MRI of the brain showed an acute left medial temporal lobe/thalamic infarct. CTA shows a left P2 PCA high grade stenosis, likely etiology. LDL 104. No Afib/flutter on Tele. No carotid stenosis. Echo showed LV EF of 20-25%, diffuse hypokinesis, trivial aortic regur, mild dilated LA and RA. Mild dilated RV. EKG 11/01/15 showed sinus rhythm at rate of 101bpm, LBBB, TWI in inferior lead. No prior EKG to compare. Troponin was negative x 3. A1c of 5.1. Pending result of hypercoagulable labs. 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   Patient works in a [employer] and mostly on his feet. No exertional chest pain of SOB. No past medical or surgical hx. Never had PCP. The patient denies nausea, vomiting, fever, chest pain, palpitations, shortness of breath, orthopnea, PND, dizziness, syncope, cough, congestion, abdominal pain, hematochezia, melena, lower extremity edema. Denies hx of tobacco abuse of illicit drug use. 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    The patient denies any family hx of MI, stroke or genetic disease.    Past Surgical History  Procedure Laterality Date  . None      No Known Allergies  I have reviewed the patient's current medications . aspirin EC  325 mg Oral Daily  . atorvastatin  40 mg Oral q1800  . enoxaparin (LOVENOX) injection  40  mg Subcutaneous Q24H  . metoprolol succinate  25 mg Oral Daily     acetaminophen, alum & mag hydroxide-simeth, bisacodyl, diphenhydrAMINE, guaiFENesin-dextromethorphan, prochlorperazine **OR** prochlorperazine **OR** prochlorperazine, senna-docusate, sodium phosphate, traZODone  Prior to Admission medications   Medication Sig Start Date End Date Taking? Authorizing Provider  aspirin EC 81 MG tablet Take 1 tablet (81 mg total) by mouth daily. 11/02/15 11/01/16  Hillary Bow, MD  atorvastatin (LIPITOR) 40 MG tablet Take 1 tablet (40 mg total) by mouth daily at 6 PM. 11/02/15   Hillary Bow, MD  metoprolol succinate (TOPROL XL) 25 MG 24 hr tablet Take 1 tablet (25 mg total) by mouth daily. 11/03/15   Hillary Bow, MD     Social History   Social History  . Marital Status: Single    Spouse Name: N/A  . Number of Children: N/A  . 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 Smoking status: Never Smoker   . Smokeless tobacco: Not on file  . Alcohol Use: 0.0 oz/week    0 Standard drinks or equivalent per week     Comment: occasionally drinks beer  . Drug Use: No  . Sexual Activity: Not on file   Other Topics Concern  . Not on file   Social History Narrative    No family status information on file.   Family History  Problem Relation Age of Onset  . Diabetes Mellitus II Maternal Grandmother   .  Diabetes Mellitus II Maternal Grandfather   . Cancer Father     pancreatic     ROS:  Full 14 point review of systems complete and found to be negative unless listed above.  Physical Exam: Blood pressure 112/76, pulse 84, temperature 98.1 F (36.7 C), temperature source Oral, resp. rate 18, height 6\' 3"  (1.905 m), weight 232 lb 14.4 oz (105.643 kg), SpO2 100 %.  General: Well developed, well nourished, male in no acute distress Head: Eyes PERRLA, No xanthomas. Normocephalic and atraumatic, oropharynx without edema or exudate.    Lungs: Resp regular and unlabored, CTA. Heart: RRR no s3, s4, or murmurs..   Neck: No carotid bruits. No lymphadenopathy.  No JVD. Abdomen: Bowel sounds present, abdomen soft and non-tender without masses or hernias noted. Msk:  No spine or cva tenderness. No weakness, no joint deformities or effusions. Extremities: No clubbing, cyanosis or edema. DP/PT/Radials 2+ and equal bilaterally. L thigh mobil, rubbery knot. R thigh textured knot. Neuro: Alert and oriented X 3. No focal deficits noted. Psych:  Good affect, responds appropriately Skin: No rashes or lesions noted.  Labs:   Lab Results  Component Value Date   WBC 3.5* 11/04/2015   HGB 13.9 11/04/2015   HCT 42.2 11/04/2015   MCV 85.1 11/04/2015   PLT 142* 11/04/2015   No results for input(s): INR in the last 72 hours.  Recent Labs Lab 11/04/15 0514  NA 139  K 3.8  CL 103  CO2 25  BUN 10  CREATININE 1.23  CALCIUM 9.5  PROT 7.1  BILITOT 1.0  ALKPHOS 32*  ALT 42  AST 40  GLUCOSE 93  ALBUMIN 3.6   No results found for: MG  Recent Labs  11/01/15 1853 11/02/15 0120 11/02/15 0557 11/02/15 1244  TROPONINI <0.03 <0.03 <0.03 <0.03   No results for input(s): TROPIPOC in the last 72 hours. No results found for: PROBNP Lab Results  Component Value Date   CHOL 202* 11/02/2015   HDL 91 11/02/2015   LDLCALC 104* 11/02/2015   TRIG 37 11/02/2015    Echo: 11/03/15 LV EF: 20% - 25%  ------------------------------------------------------------------- Indications: TIA 435.9.  ------------------------------------------------------------------- History: PMH: No past medical history-listed.  ------------------------------------------------------------------- Study Conclusions  - Left ventricle: The cavity size was mildly dilated. Systolic  function was severely reduced. The estimated ejection fraction  was in the range of 20% to 25%. Diffuse hypokinesis. - Aortic valve: There was trivial  regurgitation. Valve area (Vmax):  2.87 cm^2. - Left atrium: The atrium was mildly dilated. - Right ventricle: The cavity size was mildly dilated. - Right atrium: The atrium was mildly dilated  ECG:  Sinus rhythm with LBBB Vent. rate 101 BPM PR interval 144 ms QRS duration 168 ms QT/QTc 408/529 ms P-R-T axes 51 38 -67  Radiology:  No results found.  ASSESSMENT AND PLAN:     1. Cardiomyopathy - Echo showed LV EF of 20-25%, diffuse hypokinesis, trivial aortic regur, mild dilated LA and RA. Mild dilated RV.  EKG 11/01/15 showed sinus rhythm at rate of 101bpm, LBBB, TWI in inferior lead. No prior EKG to compare. 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 Advised to stop drinking.  - Started on ASA, lipitor and toprol XL 25mg . No anginal pain or SOB.  - Will need repeat echo in 3 months. Will start lisinopril 2.5mg  once ok with neurology. BP stable. Will need ischemic evaluation at some point given LBBB and T wave abnormality. Will  schedule outpatient f/u in 3 weeks and cath afterwards in few weeks.   2. 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    3. HLD (hyperlipidemia)  - Continue statin. Goal less than 70. Will need LFT and lipid panel check in 6 weeks.    4. Right thigh pain - pending LE doppler to r/o DVT  5. L thigh lipoma - Will get ultrasound  Signed: Bhagat,Bhavinkumar, PA 11/04/2015, 11:36 AM Pager [phone]  Co-Sign MD  I have examined the patient and reviewed assessment and plan and discussed with patient.  Agree with above as stated.  CVA. New cardiomyopathy discovered. Unclear etiology.  He has some cerebrovascular disease.  Need to r/o CAD with cath.  WiIl wait 4-6 weeks depending on recovery.    He has a large mass on the inside of his left thigh.  He states it has grown over the past two weeks.  Will get  ultrasound.  Also, he reports right thigh swelling.  Check u/s to r/o DVT.    Consider neurology consult as well to see if other w/u is needed as well.   Austin Maske S.

## 2015-11-04 NOTE — Evaluation (Signed)
Speech Language Pathology Assessment and Plan  Patient Details  Name: Austin Holmes MRN: [id number] Date of Birth: [birth year]  SLP Diagnosis: Cognitive Impairments  Rehab Potential: Good ELOS: 5-7 days     Today's Date: 11/04/2015 SLP Individual Time: 0900-1000 SLP Individual Time Calculation (min): 60 min   Problem List:  Patient Active Problem List   Diagnosis Date Noted  . 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 Cardiomyopathy ([place]) 11/03/2015  . Abnormality of gait   . Impulsive   . HLD (hyperlipidemia)   . TIA (transient ischemic attack) 11/02/2015  . Acute CVA (cerebrovascular accident) ([place]) 11/02/2015   Past Medical History: No past medical history on file. Past Surgical History:  Past Surgical History  Procedure Laterality Date  . None      Assessment / Plan / Recommendation Clinical Impression   Austin Holmes  is a 45 y.o. male in relatively good health who was admitted on to [facility] on 11/01/2015 with HA, right sided weakness and progressive difficulty walking.  MRI brain done revealing acute non-hemorrhagic infarct left temporal and left thalamus.  Pt admitted to CIR on 11/03/2015.  SLP evaluation completed 11/04/2015 with the following results: Pt presents with mild-moderate cognitive deficits characterized by decreased storage and retrieval of information, decreased emergent awareness of deficits, decreased functional problem solving, and decreased selective attention to tasks.  Pt was fully independent prior to admission, working full time and the abovementioned deficits will impact his ability to complete familiar self care and home management tasks safely.  As a result, pt would benefit from skilled ST while inpatient in order to maximize functional independence and reduce burden of care prior to discharge.  Anticipate that pt will need ST follow up at next level of care and at least intermittent supervision for medication  and financial management due to cognitive deficits.    Skilled Therapeutic Interventions          Cognitive-linguistic evaluation completed with results and recommendations reviewed with patient.   SLP discussed goals to be addressed while inpatient with pt who verbalized understanding and was in agreement with plan of care.  SLP recommended that pt have assistance for medication and financial management at discharge due to cognitive deficits.  Pt would also benefit from delaying his return to work once discharged back home to allow for more time for cognitive remediation/compensation.  Pt in agreement but will continue to reinforce recommendations.  Pt left in bed with call bell within reach.      SLP Assessment  Patient will need skilled [facility] during CIR admission    Recommendations  Patient destination: Home Follow up Recommendations: Outpatient SLP;Home Health SLP;Other (comment) (intermittent supervision for medications and finances ) Equipment Recommended: None recommended by SLP    SLP Frequency 3 to 5 out of 7 days   SLP Duration  SLP Intensity  SLP Treatment/Interventions 5-7 days   Minumum of 1-2 x/day, 30 to 90 minutes  Cognitive remediation/compensation;Cueing hierarchy;Functional tasks;Environmental controls;Internal/external aids    Pain Pain Assessment Pain Assessment: No/denies pain Pain Score: 0-No pain  Prior Functioning Cognitive/Linguistic Baseline: Within functional limits Type of Home: House  Lives With: Family Available Help at Discharge: Family Education: Completed High school Vocation: Full time employment  Function:  Eating Eating                 Cognition Comprehension Comprehension assist level: Understands complex 90% of the time/cues 10% of the time  Expression   Expression assist level: Expresses complex ideas: With extra time/assistive device  Social Interaction Social Interaction assist level: Interacts  appropriately 90% of the time - Needs monitoring or encouragement for participation or interaction.  Problem Solving Problem solving assist level: Solves basic 75 - 89% of the time/requires cueing 10 - 24% of the time  Memory Memory assist level: Recognizes or recalls 75 - 89% of the time/requires cueing 10 - 24% of the time   Short Term Goals: Week 1: SLP Short Term Goal 1 (Week 1): STG=LTG due to ELOS  Refer to Care Plan for Long Term Goals  Recommendations for other services: None  Discharge Criteria: Patient will be discharged from SLP if patient refuses treatment 3 consecutive times without medical reason, if treatment goals not met, if there is a change in medical status, if patient makes no progress towards goals or if patient is discharged from hospital.  The above assessment, treatment plan, treatment alternatives and goals were discussed and mutually agreed upon: by patient  Emilio Math 11/04/2015, 12:32 PM

## 2015-11-04 NOTE — Evaluation (Signed)
Occupational Therapy Assessment and Plan  Patient Details  Name: Austin Holmes MRN: [id number] Date of Birth: 15-Jun-1971  OT Diagnosis: cognitive deficits, hemiplegia affecting dominant side and muscle weakness (generalized) Rehab Potential: Rehab Potential (ACUTE ONLY): Good ELOS: 5-7 days   Today's Date: 11/04/2015 OT Individual Time: 1100-1200 OT Individual Time Calculation (min): 60 min     Problem List:  Patient Active Problem List   Diagnosis Date Noted  . Stroke due to embolism of left posterior cerebral artery ([place]) 11/03/2015  . Cardiomyopathy ([place]) 11/03/2015  . Abnormality of gait   . Impulsive   . HLD (hyperlipidemia)   . TIA (transient ischemic attack) 11/02/2015  . Acute CVA (cerebrovascular accident) ([place]) 11/02/2015    Past Medical History: No past medical history on file. Past Surgical History:  Past Surgical History  Procedure Laterality Date  . None      Assessment & Plan Clinical Impression: Patient is a 45 y.o. Right handed male in relatively good health who was admitted on to [facility] on 11/01/2015 with HA, right sided weakness and progressive difficulty walking. 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 2D echo with severe reduction in systolic funciton with diffuse hypokinesis and EF 20-25% with trivial AVR. 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 Dr. Tyron Russell evaluated patient and recommended full strength ASA for stroke prevention and hypercoagulopathy work up--labs pending.   Patient transferred to [facility] on 11/03/2015 .    Patient currently requires min with basic self-care skills secondary to impaired timing and sequencing and decreased coordination, decreased awareness, decreased problem solving, decreased safety awareness and decreased memory and decreased  standing balance, decreased postural control and decreased balance strategies.  Prior to hospitalization, patient could complete ADLs and IADLs with independent .  Patient will benefit from skilled intervention to decrease level of assist with basic self-care skills and increase independence with basic self-care skills prior to discharge home with care partner.  Anticipate patient will require intermittent supervision and follow up outpatient vs No follow up.  OT - End of Session Activity Tolerance: Tolerates 30+ min activity without fatigue Endurance Deficit: No Endurance Deficit Description: requires rest breaks after endurance activities OT Assessment Rehab Potential (ACUTE ONLY): Good OT Patient demonstrates impairments in the following area(s): Balance;Cognition;Endurance;Motor;Safety OT Basic ADL's Functional Problem(s): Grooming;Bathing;Dressing;Toileting OT Advanced ADL's Functional Problem(s): Simple Meal Preparation OT Transfers Functional Problem(s): Toilet;Tub/Shower OT Plan OT Intensity: Minimum of 1-2 x/day, 45 to 90 minutes OT Frequency: 5 out of 7 days OT Duration/Estimated Length of Stay: 5-7 days OT Treatment/Interventions: Balance/vestibular training;Cognitive remediation/compensation;Community reintegration;Discharge planning;Disease mangement/prevention;DME/adaptive equipment instruction;Functional mobility training;Neuromuscular re-education;Pain management;Patient/family education;Psychosocial support;Self Care/advanced ADL retraining;Therapeutic Activities;Therapeutic Exercise;UE/LE Strength taining/ROM;UE/LE Coordination activities OT Self Feeding Anticipated Outcome(s): Mod I OT Basic Self-Care Anticipated Outcome(s): Mod I OT Toileting Anticipated Outcome(s): Mod I OT Bathroom Transfers Anticipated Outcome(s): Mod I OT Recommendation Patient destination: Home Follow Up Recommendations: Outpatient OT (vs None) Equipment Recommended: Tub/shower seat;To be  determined   Skilled Therapeutic Intervention OT eval completed with discussion of rehab process, OT purpose, POC, ELOS, and goals.  ADL assessment completed with bathing at sit > stand level in room shower and dressing at standing level.  Educated pt on fall risk and encouraged sit > stand for LB dressing.  Pt demonstrating decreased awareness of deficits and impaired safety awareness as well as impaired  recall.  Min guard with all mobility, noted guarded RUE with ambulation and decreased fine motor control and pincer grasp during 9 hole peg test.  Completed tub/shower transfer stepping over tub ledge with supervision and use of grab bar, pt reports having grab bar in shower at home.    OT Evaluation Precautions/Restrictions  Precautions Precautions: Fall Restrictions Weight Bearing Restrictions: No General   Vital Signs Therapy Vitals Temp: 98.6 F (37 C) Temp Source: Oral Pulse Rate: 100 Resp: 17 BP: 110/67 mmHg Patient Position (if appropriate): Sitting Oxygen Therapy SpO2: 98 % O2 Device: Not Delivered Pain Pain Assessment Pain Assessment: No/denies pain Home Living/Prior Functioning Home Living Family/patient expects to be discharged to:: Private residence Living Arrangements: Parent Available Help at Discharge: Family Type of Home: House Home Access: Stairs to enter Technical brewer of Steps: 2 Entrance Stairs-Rails: Left Home Layout: One level Bathroom Shower/Tub: Tub/shower unit, Architectural technologist: Standard Additional Comments: grab bars and hand held shower  Lives With: Family IADL History Homemaking Responsibilities: Yes Meal Prep Responsibility: Therapist, occupational Responsibility: Public house manager Paying/Finance Responsibility: Primary Education: Completed High school Prior Function Level of Independence: Independent with basic ADLs, Independent with homemaking with ambulation, Independent with gait  Able to Take Stairs?: Yes Driving: Yes Vocation:  Full time employment Vocation Requirements: is on his feet all day, works at [employer]: Hobbies-yes (Comment) (cooking, music) ADL   See Function Navigator Vision/Perception  Vision- History Baseline Vision/History: No visual deficits Patient Visual Report: No change from baseline Vision- Assessment Vision Assessment?: Yes Eye Alignment: Within Functional Limits Ocular Range of Motion: Within Functional Limits Alignment/Gaze Preference: Within Defined Limits Tracking/Visual Pursuits: Able to track stimulus in all quads without difficulty Saccades: Within functional limits Depth Perception:  [facility]) Perception Comments: WFL  Cognition Overall Cognitive Status: Impaired/Different from baseline Arousal/Alertness: Awake/alert Orientation Level: Person;Place;Situation Person: Oriented Place: Oriented Situation: Oriented Year: 2017 Month: April Day of Week: Correct Memory: Impaired Memory Impairment: Storage deficit;Retrieval deficit Immediate Memory Recall: Sock;Blue;Bed Memory Recall: Blue Memory Recall Blue: Without Cue Attention: Selective Selective Attention: Impaired Selective Attention Impairment: Functional basic;Verbal basic Awareness: Appears intact Problem Solving: Impaired Problem Solving Impairment: Functional basic Executive Function: Organizing;Self Monitoring;Self Correcting Organizing: Impaired Organizing Impairment: Functional complex Self Monitoring: Impaired Self Monitoring Impairment: Functional complex Self Correcting: Impaired Self Correcting Impairment: Functional complex Safety/Judgment: Impaired Comments: slightly impulsive with decreased awareness of deficits  Sensation Sensation Light Touch: Appears Intact Stereognosis: Not tested Hot/Cold: Not tested Proprioception: Appears Intact Coordination Gross Motor Movements are Fluid and Coordinated: Yes Fine Motor Movements are Fluid and Coordinated: No Finger Nose Finger Test:  [facility] Heel Shin Test: WFL BLE 9 Hole Peg Test: Rt: 36 seconds, Lt: 23 seconds Motor  Motor Motor: Hemiplegia Motor - Skilled Clinical Observations: mild R hemiparesis Mobility  Bed Mobility Bed Mobility: Supine to Sit;Sit to Supine Supine to Sit: 6: Modified independent (Device/Increase time) Sit to Supine: 6: Modified independent (Device/Increase time)  Trunk/Postural Assessment  Cervical Assessment Cervical Assessment: Within Functional Limits Thoracic Assessment Thoracic Assessment: Within Functional Limits Lumbar Assessment Lumbar Assessment: Within Functional Limits Postural Control Postural Control: Deficits on evaluation (impaired stepping strategies for balance recovery)  Balance Balance Balance Assessed: Yes Standardized Balance Assessment Standardized Balance Assessment: Berg Balance Test;Dynamic Gait Index;Timed Up and Go Test Berg Balance Test Sit to Stand: Able to stand without using hands and stabilize independently Standing Unsupported: Able to stand safely 2 minutes Sitting with Back Unsupported but Feet Supported on Floor or Stool: Able to sit safely and  securely 2 minutes Stand to Sit: Sits safely with minimal use of hands Transfers: Able to transfer safely, minor use of hands Standing Unsupported with Eyes Closed: Able to stand 10 seconds with supervision Standing Ubsupported with Feet Together: Able to place feet together independently and stand 1 minute safely From Standing, Reach Forward with Outstretched Arm: Can reach confidently >25 cm (10") From Standing Position, Pick up Object from Floor: Able to pick up shoe, needs supervision From Standing Position, Turn to Look Behind Over each Shoulder: Looks behind from both sides and weight shifts well Turn 360 Degrees: Able to turn 360 degrees safely in 4 seconds or less Standing Unsupported, Alternately Place Feet on Step/Stool: Able to stand independently and safely and complete 8 steps in 20 seconds Standing  Unsupported, One Foot in Front: Able to plae foot ahead of the other independently and hold 30 seconds Standing on One Leg: Able to lift leg independently and hold 5-10 seconds Total Score: 52 Timed Up and Go Test TUG: Normal TUG;Manual TUG Normal TUG (seconds): 12 Manual TUG (seconds): 15 Cognitive TUG (seconds): 20 Static Sitting Balance Static Sitting - Balance Support: No upper extremity supported;Feet supported Static Sitting - Level of Assistance: 6: Modified independent (Device/Increase time) Dynamic Sitting Balance Dynamic Sitting - Balance Support: No upper extremity supported;Feet supported;During functional activity Dynamic Sitting - Level of Assistance: 6: Modified independent (Device/Increase time) Static Standing Balance Static Standing - Balance Support: No upper extremity supported Static Standing - Level of Assistance: 5: Stand by assistance Static Stance: Eyes closed Static Stance: Eyes Closed: x30 sec with S Dynamic Standing Balance Dynamic Standing - Balance Support: No upper extremity supported;During functional activity Dynamic Standing - Level of Assistance: 5: Stand by assistance Dynamic Standing - Balance Activities: Lateral lean/weight shifting;Reaching for objects;Reaching for weighted objects Extremity/Trunk Assessment RUE Assessment RUE Assessment: Within Functional Limits LUE Assessment LUE Assessment: Within Functional Limits   See Function Navigator for Current Functional Status.   Refer to Care Plan for Long Term Goals  Recommendations for other services: None  Discharge Criteria: Patient will be discharged from OT if patient refuses treatment 3 consecutive times without medical reason, if treatment goals not met, if there is a change in medical status, if patient makes no progress towards goals or if patient is discharged from hospital.  The above assessment, treatment plan, treatment alternatives and goals were discussed and mutually agreed  upon: by patient  Simonne Come 11/04/2015, 3:19 PM

## 2015-11-04 NOTE — Progress Notes (Signed)
Algis Liming, PA asked for RN to measure patients thighs bilaterally because patient is complaining of leg swelling although not obvious from visual assessment.  Right thigh: top- 25 in, middle-22.5 in, bottom-19 in;  Left thigh:  Top-24.5 in, middle-19 in, bottom- 18 in.  Algis Liming, PA notified of results.  Brita Romp, RN

## 2015-11-04 NOTE — Evaluation (Signed)
Physical Therapy Assessment and Plan  Patient Details  Name: MAVERIK FOOT MRN: [id number] Date of Birth: 1971/05/11  PT Diagnosis: Abnormality of gait, Difficulty walking, Hemiparesis dominant, Muscle weakness and Pain in RLE Rehab Potential: Good ELOS: 5-7 days   Today's Date: 11/04/2015 PT Individual Time: 1300-1415 PT Individual Time Calculation (min): 75 min    Problem List:  Patient Active Problem List   Diagnosis Date Noted  . 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 Cardiomyopathy ([place]) 11/03/2015  . Abnormality of gait   . Impulsive   . HLD (hyperlipidemia)   . TIA (transient ischemic attack) 11/02/2015  . Acute CVA (cerebrovascular accident) ([place]) 11/02/2015    Past Medical History: No past medical history on file. Past Surgical History:  Past Surgical History  Procedure Laterality Date  . None      Assessment & Plan Clinical Impression: Patient is a 45 y.o. male with no significant past medical history presented to the emergency room with the weakness in the right upper and lower extremity. The above complaint started yesterday evening after patient came back from work. Patient works in a [employer]. No history of any seizure. No history of any headache. He also had some numbness initially but in the emergency room he was able to move his right upper extremity and lower extremity with out any difficulty. No numbness or any tingling sensation in the emergency room. No complaints of any slurred speech or difficulty swallowing food. No history of any facial droop. No history of any stroke in the past. Patient was worked up with a CT head which showed no acute intracranial abnormality. Telemetry neurology consult was also done and patient is out of the TPA window, recommended MRI brain and a carotid ultrasound study and to start aspirin. Was admitted to medical floor with tele. No Afib/flutter on Tele. MRI showed acute left cva  left temporal and thalamus. Carotids normal. Echo showed EF 25-30%. No symptoms. Started on Toprol XL 25 mg daily. Will need routine cardiology f/u as OP. Will need ACE i added by PCP or Cardiology if BP tolerates. Symptoms of right weakness improving with therapy recommendations for inpatient rehab. Patient transferred to [facility] on 11/03/2015 .   Patient currently requires supervision with mobility secondary to muscle weakness, decreased coordination, decreased awareness, decreased problem solving and decreased memory and decreased standing balance, decreased postural control, decreased balance strategies and hemiparesis.  Prior to hospitalization, patient was independent  with mobility and lived with Family in a House home.  Home access is 2Stairs to enter.  Patient will benefit from skilled PT intervention to maximize safe functional mobility, minimize fall risk and decrease caregiver burden for planned discharge home with intermittent assist.  Anticipate patient will not need PT follow up at discharge.  PT - End of Session Activity Tolerance: Tolerates 30+ min activity with multiple rests Endurance Deficit: No Endurance Deficit Description: requires rest breaks after endurance activities PT Assessment Rehab Potential (ACUTE/IP ONLY): Good Barriers to Discharge: Decreased caregiver support PT Patient demonstrates impairments in the following area(s): Balance;Endurance;Motor;Safety PT Transfers Functional Problem(s): Bed to Chair;Bed Mobility;Car;Furniture;Floor PT Locomotion Functional Problem(s): Ambulation;Stairs PT Plan PT Intensity: Minimum of 1-2 x/day ,45 to 90 minutes PT Frequency: 5 out of 7 days PT Duration Estimated Length of Stay: 5-7 days PT Treatment/Interventions: Ambulation/gait training;Balance/vestibular training;Patient/family education;Stair training;Visual/perceptual remediation/compensation;Therapeutic Activities;Therapeutic Exercise;UE/LE Strength taining/ROM;UE/LE  Coordination activities;Neuromuscular re-education;Functional mobility training;Community reintegration;Discharge planning;Cognitive remediation/compensation PT Transfers Anticipated Outcome(s): mod I PT  Locomotion Anticipated Outcome(s): mod I in home, S in community PT Recommendation Recommendations for Other Services: Neuropsych consult Follow Up Recommendations: None Patient destination: Home Equipment Recommended: None recommended by PT  Skilled Therapeutic Intervention Pt received seated in recliner, denies pain and agreeable to treatment. PT initial evaluation performed and completed with S overall as described below. Pt perseverate on R thigh "tightness, swelling". Educated pt in hip flexor and quad stretches, LE strengthening exercises to improve ROM, strength. Community ambulation >300' with S overall, min cues for safety, use of rails for stair ascent/descent. Educated pt on rehab process, goals, estimated length of stay, falls prevention safety with pt to use call bell system and have staff present when transferring or getting up; pt and mother agreeable to all the above. Remained seated in recliner with mother present at completion of session, all needs within reach.   PT Evaluation Precautions/Restrictions Precautions Precautions: Fall Restrictions Weight Bearing Restrictions: No General Chart Reviewed: Yes Response to Previous Treatment: Patient with no complaints from previous session. Family/Caregiver Present: No  Pain Pain Assessment Pain Assessment: No/denies pain Home Living/Prior Functioning Home Living Available Help at Discharge: Family Type of Home: House  Lives With: Family Prior Function Vocation: Full time employment Vision/Perception  Vision - Assessment Eye Alignment: Within Functional Limits Ocular Range of Motion: Within Functional Limits Alignment/Gaze Preference: Within Defined Limits Saccades: Within functional limits Perception Comments: WFL   Cognition Overall Cognitive Status: Impaired/Different from baseline Arousal/Alertness: Awake/alert Orientation Level: Oriented X4 Attention: Selective Selective Attention: Impaired Selective Attention Impairment: Functional basic;Verbal basic Memory: Impaired Memory Impairment: Storage deficit;Retrieval deficit Awareness: Appears intact Problem Solving: Impaired Problem Solving Impairment: Functional basic Executive Function: Organizing;Self Monitoring;Self Correcting Organizing: Impaired Organizing Impairment: Functional complex Self Monitoring: Impaired Self Monitoring Impairment: Functional complex Self Correcting: Impaired Self Correcting Impairment: Functional complex Safety/Judgment: Impaired Comments: slightly impulsive with decreased awareness of deficits  Sensation Sensation Light Touch: Appears Intact Stereognosis: Not tested Hot/Cold: Not tested Proprioception: Appears Intact Coordination Gross Motor Movements are Fluid and Coordinated: Yes Fine Motor Movements are Fluid and Coordinated: No Finger Nose Finger Test: [facility] Heel Shin Test: WFL BLE 9 Hole Peg Test: Rt: 36 seconds, Lt: 23 seconds Motor  Motor Motor: Hemiplegia Motor - Skilled Clinical Observations: mild R hemiparesis  Mobility Bed Mobility Bed Mobility: Supine to Sit;Sit to Supine Supine to Sit: 6: Modified independent (Device/Increase time) Sit to Supine: 6: Modified independent (Device/Increase time) Transfers Transfers: Yes Stand Pivot Transfers: 5: Supervision Stand Pivot Transfer Details: Verbal cues for precautions/safety Locomotion  Ambulation Ambulation: Yes Ambulation/Gait Assistance: 4: Min guard Ambulation Distance (Feet): 200 Feet Assistive device: None Ambulation/Gait Assistance Details: Verbal cues for precautions/safety Gait Gait: Yes Gait Pattern: Impaired Gait Pattern: Poor foot clearance - right Gait velocity: WFL for age/gender norms Stairs / Additional  Locomotion Stairs: Yes Stairs Assistance: 4: Min guard Stairs Assistance Details: Verbal cues for precautions/safety;Verbal cues for technique Stair Management Technique: One rail Right;Alternating pattern;Forwards Number of Stairs: 12 Height of Stairs: 6 Ramp: 5: Supervision Curb: 5: Supervision Wheelchair Mobility Wheelchair Mobility: No  Trunk/Postural Assessment  Cervical Assessment Cervical Assessment: Within Functional Limits Thoracic Assessment Thoracic Assessment: Within Functional Limits Lumbar Assessment Lumbar Assessment: Within Functional Limits Postural Control Postural Control: Deficits on evaluation (impaired stepping strategies for balance recovery)  Balance Balance Balance Assessed: Yes Standardized Balance Assessment Standardized Balance Assessment: Berg Balance Test;Dynamic Gait Index;Timed Up and Go Test Berg Balance Test Sit to Stand: Able to stand without using hands and stabilize independently Standing Unsupported: Able to  stand safely 2 minutes Sitting with Back Unsupported but Feet Supported on Floor or Stool: Able to sit safely and securely 2 minutes Stand to Sit: Sits safely with minimal use of hands Transfers: Able to transfer safely, minor use of hands Standing Unsupported with Eyes Closed: Able to stand 10 seconds with supervision Standing Ubsupported with Feet Together: Able to place feet together independently and stand 1 minute safely From Standing, Reach Forward with Outstretched Arm: Can reach confidently >25 cm (10") From Standing Position, Pick up Object from Floor: Able to pick up shoe, needs supervision From Standing Position, Turn to Look Behind Over each Shoulder: Looks behind from both sides and weight shifts well Turn 360 Degrees: Able to turn 360 degrees safely in 4 seconds or less Standing Unsupported, Alternately Place Feet on Step/Stool: Able to stand independently and safely and complete 8 steps in 20 seconds Standing Unsupported,  One Foot in Front: Able to plae foot ahead of the other independently and hold 30 seconds Standing on One Leg: Able to lift leg independently and hold 5-10 seconds Total Score: 52 Timed Up and Go Test TUG: Normal TUG;Manual TUG Normal TUG (seconds): 12 Manual TUG (seconds): 15 Cognitive TUG (seconds): 20 Static Sitting Balance Static Sitting - Balance Support: No upper extremity supported;Feet supported Static Sitting - Level of Assistance: 6: Modified independent (Device/Increase time) Dynamic Sitting Balance Dynamic Sitting - Balance Support: No upper extremity supported;Feet supported;During functional activity Dynamic Sitting - Level of Assistance: 6: Modified independent (Device/Increase time) Static Standing Balance Static Standing - Balance Support: No upper extremity supported Static Standing - Level of Assistance: 5: Stand by assistance Static Stance: Eyes closed Static Stance: Eyes Closed: x30 sec with S Dynamic Standing Balance Dynamic Standing - Balance Support: No upper extremity supported;During functional activity Dynamic Standing - Level of Assistance: 5: Stand by assistance Dynamic Standing - Balance Activities: Lateral lean/weight shifting;Reaching for objects;Reaching for weighted objects Extremity Assessment  RUE Assessment RUE Assessment: Within Functional Limits LUE Assessment LUE Assessment: Within Functional Limits RLE Assessment RLE Assessment: Exceptions to [facility] (mild deficits, 4+/5 throughout) LLE Assessment LLE Assessment: Within Functional Limits   See Function Navigator for Current Functional Status.   Refer to Care Plan for Long Term Goals  Recommendations for other services: None  Discharge Criteria: Patient will be discharged from PT if patient refuses treatment 3 consecutive times without medical reason, if treatment goals not met, if there is a change in medical status, if patient makes no progress towards goals or if patient is discharged  from hospital.  The above assessment, treatment plan, treatment alternatives and goals were discussed and mutually agreed upon: by patient  Luberta Mutter 11/04/2015, 4:16 PM

## 2015-11-04 NOTE — Progress Notes (Signed)
  VASCULAR LAB PRELIMINARY  PRELIMINARY  PRELIMINARY  PRELIMINARY  Right lower extremity venous duplex completed.      Right:  No evidence of DVT, superficial thrombosis, or Baker's cyst.  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  Janifer Adie, RVT, RDMS 11/04/2015, 3:38 PM

## 2015-11-04 NOTE — Progress Notes (Signed)
Discussed patient with Neuro--Dr. Leonie Man recommends TEE for full work up.  Cardiology updated and plans to do procedure on Monday to complete stroke work up.

## 2015-11-04 NOTE — Plan of Care (Signed)
Problem: Food- and Nutrition-Related Knowledge Deficit (NB-1.1) Goal: Nutrition education Formal process to instruct or train a patient/client in a skill or to impart knowledge to help patients/clients voluntarily manage or modify food choices and eating behavior to maintain or improve health. Outcome: Completed/Met Date Met:  11/04/15 Nutrition Education Note  RD consulted for nutrition education regarding a Heart Healthy diet.   Lipid Panel     Component Value Date/Time    CHOL 202* 11/02/2015 0557    TRIG 37 11/02/2015 0557    HDL 91 11/02/2015 0557    CHOLHDL 2.2 11/02/2015 0557    VLDL 7 11/02/2015 0557    LDLCALC 104* 11/02/2015 0557    RD provided "Heart Healthy Nutrition Therapy" handout from the [employer]. Reviewed patient's dietary recall. Provided examples on ways to decrease sodium and fat intake in diet. Discouraged intake of processed foods and use of salt shaker. Encouraged fresh fruits and vegetables as well as whole grain sources of carbohydrates to maximize fiber intake. Encouraged limiting/avoiding fast food/restaturant foods. Teach back method used.  Expect good compliance.  Body mass index is 29.11 kg/(m^2). Pt meets criteria for overweight based on current BMI.  Current diet order is heart diet, patient is consuming approximately 75-100% of meals at this time. Appetite is fine currently and PTA with no other difficulties. Labs and medications reviewed. No further nutrition interventions warranted at this time. RD contact information provided. If additional nutrition issues arise, please re-consult RD.  Corrin Parker, MS, RD, LDN Pager # [phone] After hours/ weekend pager # [phone]

## 2015-11-04 NOTE — Progress Notes (Signed)
45 y.o. male in relatively good health who was admitted on to [facility] on 11/01/2015 with HA, right sided weakness and progressive difficulty walking. 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 2D echo with severe reduction in systolic funciton with diffuse hypokinesis and EF 20-25% with trivial AVR. 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   Subjective/Complaints: Right thigh and leg feel tight, not painful, no SOB No prior issues with RLE PTA  ROS- - CP, -N/V/D, last BM yesterday  Objective: Vital Signs: Blood pressure 112/76, pulse 84, temperature 98.1 F (36.7 C), temperature source Oral, resp. rate 18, height '6\' 3"'  (1.905 m), weight 105.643 kg (232 lb 14.4 oz), SpO2 100 %. Mr Brain Wo Contrast  11/02/2015  CLINICAL DATA:  45 year old male with un steady gait, blurred vision right lower extremity paresthesias. Subsequent encounter. EXAM: MRI HEAD WITHOUT CONTRAST MRA HEAD WITHOUT CONTRAST TECHNIQUE: Multiplanar, multiecho pulse sequences of the brain and surrounding structures were obtained without intravenous contrast. Angiographic images of the head were obtained using MRA technique without contrast. COMPARISON:  CT angiogram 11/01/2015. 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 No intracranial hemorrhage. Very mild periventricular white matter changes may be related to result of small vessel disease. No age advanced atrophy or hydrocephalus. No intracranial mass lesion noted on this unenhanced exam. Decreased signal intensity of bone marrow (most notable upper cervical spine) may be related to patient's habitus. Correlation with CBC to exclude anemia contributing to this appearance may be considered Cervical medullary junction, pituitary region,  pineal region and orbital structures unremarkable. Junction Mild paranasal sinus mucosal thickening. MRA HEAD FINDINGS Narrowing of the cavernous segment of the internal carotid artery greater on left. This is felt to represent overestimation of degree of narrowing given the appearance on recent CT angiogram. Mild irregularity M1 segment middle cerebral artery bilaterally. Minimal bulge superior margin felt to be origin of a vessel rather than aneurysm. Moderate narrowing M2 segment /middle cerebral artery branches bilaterally. Mild to moderate narrowing A1 segment left anterior cerebral artery. Moderate to marked narrowing A2 segment left anterior cerebral artery. Mild irregularity with minimal to mild narrowing portions of right anterior cerebral artery. Moderate narrowing distal vertebral arteries more notable on the left. Nonvisualized posterior inferior cerebellar arteries and anterior inferior cerebellar arteries bilaterally. Mild to slightly moderate narrowing and irregularity basilar artery. 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 Mild to moderate narrowing of portions of the right posterior cerebral artery. Moderate narrowing superior cerebellar artery bilaterally. 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 Decreased signal intensity of bone marrow (most notable upper cervical spine) may be related to patient's habitus. Correlation with CBC to exclude anemia contributing to this appearance may be considered MRA HEAD MR angiogram appears to overestimate degree of narrowing given the appearance on recent CT angiogram as detailed above. 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 Electronically Signed   By: Genia Del M.D.   On: 11/02/2015 11:54   Mr Jodene Nam Head/brain Wo Cm  11/02/2015  CLINICAL DATA:  45 year old male with un steady gait, blurred vision right lower extremity paresthesias.  Subsequent encounter. EXAM: MRI HEAD WITHOUT CONTRAST MRA HEAD WITHOUT CONTRAST TECHNIQUE: Multiplanar, multiecho pulse sequences of the brain and surrounding structures  were obtained without intravenous contrast. Angiographic images of the head were obtained using MRA technique without contrast. COMPARISON:  CT angiogram 11/01/2015. 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 No intracranial hemorrhage. Very mild periventricular white matter changes may be related to result of small vessel disease. No age advanced atrophy or hydrocephalus. No intracranial mass lesion noted on this unenhanced exam. Decreased signal intensity of bone marrow (most notable upper cervical spine) may be related to patient's habitus. Correlation with CBC to exclude anemia contributing to this appearance may be considered Cervical medullary junction, pituitary region, pineal region and orbital structures unremarkable. Junction Mild paranasal sinus mucosal thickening. MRA HEAD FINDINGS Narrowing of the cavernous segment of the internal carotid artery greater on left. This is felt to represent overestimation of degree of narrowing given the appearance on recent CT angiogram. Mild irregularity M1 segment middle cerebral artery bilaterally. Minimal bulge superior margin felt to be origin of a vessel rather than aneurysm. Moderate narrowing M2 segment /middle cerebral artery branches bilaterally. Mild to moderate narrowing A1 segment left anterior cerebral artery. Moderate to marked narrowing A2 segment left anterior cerebral artery. Mild irregularity with minimal to mild narrowing portions of right anterior cerebral artery. Moderate narrowing distal vertebral arteries more notable on the left. Nonvisualized posterior inferior cerebellar arteries and anterior inferior cerebellar arteries bilaterally. Mild to slightly moderate narrowing and irregularity basilar artery. Occlusion of the  left posterior cerebral artery just beyond P1 segment. Mild to moderate narrowing of portions of the right posterior cerebral artery. Moderate narrowing superior cerebellar artery bilaterally. 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 Decreased signal intensity of bone marrow (most notable upper cervical spine) may be related to patient's habitus. Correlation with CBC to exclude anemia contributing to this appearance may be considered MRA HEAD MR angiogram appears to overestimate degree of narrowing given the appearance on recent CT angiogram as detailed above. 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 Electronically Signed   By: Genia Del M.D.   On: 11/02/2015 11:54   Results for orders placed or performed during the hospital encounter of 11/03/15 (from the past 72 hour(s))  Comprehensive metabolic panel     Status: Abnormal   Collection Time: 11/04/15  5:14 AM  Result Value Ref Range   Sodium 139 135 - 145 mmol/L   Potassium 3.8 3.5 - 5.1 mmol/L   Chloride 103 101 - 111 mmol/L   CO2 25 22 - 32 mmol/L   Glucose, Bld 93 65 - 99 mg/dL   BUN 10 6 - 20 mg/dL   Creatinine, Ser 1.23 0.61 - 1.24 mg/dL   Calcium 9.5 8.9 - 10.3 mg/dL   Total Protein 7.1 6.5 - 8.1 g/dL   Albumin 3.6 3.5 - 5.0 g/dL   AST 40 15 - 41 U/L   ALT 42 17 - 63 U/L   Alkaline Phosphatase 32 (L) 38 - 126 U/L   Total Bilirubin 1.0 0.3 - 1.2 mg/dL   GFR calc non Af Amer >60 >60 mL/min   GFR calc Af Amer >60 >60 mL/min    Comment: (NOTE) The eGFR has been calculated using the CKD EPI equation. This calculation has not been validated in all clinical situations. eGFR's persistently <60 mL/min signify possible Chronic Kidney Disease.    Anion gap 11 5 - 15  CBC WITH DIFFERENTIAL     Status: Abnormal   Collection Time: 11/04/15  5:14  AM  Result Value Ref Range   WBC 3.5 (L) 4.0 - 10.5 K/uL   RBC 4.96 4.22 - 5.81 MIL/uL   Hemoglobin 13.9  13.0 - 17.0 g/dL   HCT 42.2 39.0 - 52.0 %   MCV 85.1 78.0 - 100.0 fL   MCH 28.0 26.0 - 34.0 pg   MCHC 32.9 30.0 - 36.0 g/dL   RDW 14.3 11.5 - 15.5 %   Platelets 142 (L) 150 - 400 K/uL   Neutrophils Relative % 44 %   Neutro Abs 1.6 (L) 1.7 - 7.7 K/uL   Lymphocytes Relative 44 %   Lymphs Abs 1.5 0.7 - 4.0 K/uL   Monocytes Relative 11 %   Monocytes Absolute 0.4 0.1 - 1.0 K/uL   Eosinophils Relative 1 %   Eosinophils Absolute 0.0 0.0 - 0.7 K/uL   Basophils Relative 0 %   Basophils Absolute 0.0 0.0 - 0.1 K/uL     HEENT: normal Cardio: RRR and no murmur Resp: CTA B/L and unlabored GI: BS positive and NT, ND Extremity:  Pulses positive and No Edema Skin:   Intact and Other No LE discoloration Neuro: Alert/Oriented, Normal Sensory and Abnormal Motor 4/5 R HF, KE ADF, 5/5 on Left side, 5/5 in BUE Musc/Skel:  Normal Gen NAD   Assessment/Plan: 1. 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 which require 3+ hours per day of interdisciplinary therapy in a comprehensive inpatient rehab setting. Physiatrist is providing close team supervision and 24 hour management of active medical problems listed below. Physiatrist and rehab team continue to assess barriers to discharge/monitor patient progress toward functional and medical goals. FIM:                   Function - Comprehension Comprehension: Auditory Comprehension assist level: Understands complex 90% of the time/cues 10% of the time  Function - Expression Expression: Verbal Expression assist level: Expresses complex 90% of the time/cues < 10% of the time  Function - Social Interaction Social Interaction assist level: Interacts appropriately 90% of the time - Needs monitoring or encouragement for participation or interaction.  Function - Problem Solving Problem solving assist level: Solves complex 90% of the time/cues < 10% of the time  Function - Memory Memory assist level:  Recognizes or recalls 90% of the time/requires cueing < 10% of the time Patient normally able to recall (first 3 days only): Current season 1.  Abnormality of gait, impulsivity secondary to left medial temporal lobe and left thalamic infarct.- initiate therapy             Cont meds 2.  DVT Prophylaxis/Anticoagulation: Mechanical: Sequential compression devices, below knee Bilateral lower extremities, swelling sensation in RLE per  Pt no pain no objective swelling , check screening doppler Pharmaceutical: Lovenox 3. Pain Management: Tylenol PRN. 4. Mood: Environmental support. 5. Neuropsych: This patient is capable of making decisions on his own behalf. 6. Skin/Wound Care: Routine skin care. 7. Fluids/Electrolytes/Nutrition: Routine I/Os with follow up chemistries. 8. Dyslipidemia: Lipitor 9. Systolic CHF: Cont toprolol.  Will add ACE if BP permits.  Systolic BP 952 still too low to add ACE-I Will need to follow up with Cardiology.     LOS (Days) 1 A FACE TO FACE EVALUATION WAS PERFORMED  KIRSTEINS,ANDREW E 11/04/2015, 7:52 AM

## 2015-11-05 ENCOUNTER — Inpatient Hospital Stay (HOSPITAL_COMMUNITY): Payer: Self-pay | Admitting: Occupational Therapy

## 2015-11-05 ENCOUNTER — Inpatient Hospital Stay (HOSPITAL_COMMUNITY): Payer: Self-pay

## 2015-11-05 ENCOUNTER — Inpatient Hospital Stay (HOSPITAL_COMMUNITY): Payer: Self-pay | Admitting: Speech Pathology

## 2015-11-05 ENCOUNTER — Inpatient Hospital Stay (HOSPITAL_COMMUNITY): Payer: Self-pay | Admitting: Physical Therapy

## 2015-11-05 DIAGNOSIS — R2240 Localized swelling, mass and lump, unspecified lower limb: Secondary | ICD-10-CM | POA: Diagnosis present

## 2015-11-05 DIAGNOSIS — R2242 Localized swelling, mass and lump, left lower limb: Secondary | ICD-10-CM

## 2015-11-05 MED ORDER — GADOBENATE DIMEGLUMINE 529 MG/ML IV SOLN
20.0000 mL | Freq: Once | INTRAVENOUS | Status: AC | PRN
  Administered 2015-11-05: 20 mL via INTRAVENOUS

## 2015-11-05 NOTE — Progress Notes (Signed)
Social Work Patient ID: Austin Holmes, male   DOB: 1971-01-30, 45 y.o.   MRN: [id number]   Alerted by therapies that they feel pt will be ready for d/c home on 4/11 following therapies.  Dr. Letta Pate aware and agreeable.  Pt very pleased.  He is aware he is to have a TEE performed on Monday with cardiology.    Jaskiran Pata, LCSW

## 2015-11-05 NOTE — Progress Notes (Signed)
45 y.o. male in relatively good health who was admitted on to [facility] on 11/01/2015 with HA, right sided weakness and progressive difficulty walking. 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 2D echo with severe reduction in systolic funciton with diffuse hypokinesis and EF 20-25% with trivial AVR. 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   Subjective/Complaints: Right thigh feels tight but better with amb No LE pain  ROS- - CP, -N/V/D, last BM yesterday  Objective: Vital Signs: Blood pressure 111/76, pulse 80, temperature 98.3 F (36.8 C), temperature source Oral, resp. rate 17, height '6\' 3"'  (1.905 m), weight 105.643 kg (232 lb 14.4 oz), SpO2 99 %. No results found. Results for orders placed or performed during the hospital encounter of 11/03/15 (from the past 72 hour(s))  Comprehensive metabolic panel     Status: Abnormal   Collection Time: 11/04/15  5:14 AM  Result Value Ref Range   Sodium 139 135 - 145 mmol/L   Potassium 3.8 3.5 - 5.1 mmol/L   Chloride 103 101 - 111 mmol/L   CO2 25 22 - 32 mmol/L   Glucose, Bld 93 65 - 99 mg/dL   BUN 10 6 - 20 mg/dL   Creatinine, Ser 1.23 0.61 - 1.24 mg/dL   Calcium 9.5 8.9 - 10.3 mg/dL   Total Protein 7.1 6.5 - 8.1 g/dL   Albumin 3.6 3.5 - 5.0 g/dL   AST 40 15 - 41 U/L   ALT 42 17 - 63 U/L   Alkaline Phosphatase 32 (L) 38 - 126 U/L   Total Bilirubin 1.0 0.3 - 1.2 mg/dL   GFR calc non Af Amer >60 >60 mL/min   GFR calc Af Amer >60 >60 mL/min    Comment: (NOTE) The eGFR has been calculated using the CKD EPI equation. This calculation has not been validated in all clinical situations. eGFR's persistently <60 mL/min signify possible Chronic Kidney Disease.    Anion gap 11 5 - 15  CBC WITH DIFFERENTIAL     Status: Abnormal   Collection Time: 11/04/15   5:14 AM  Result Value Ref Range   WBC 3.5 (L) 4.0 - 10.5 K/uL   RBC 4.96 4.22 - 5.81 MIL/uL   Hemoglobin 13.9 13.0 - 17.0 g/dL   HCT 42.2 39.0 - 52.0 %   MCV 85.1 78.0 - 100.0 fL   MCH 28.0 26.0 - 34.0 pg   MCHC 32.9 30.0 - 36.0 g/dL   RDW 14.3 11.5 - 15.5 %   Platelets 142 (L) 150 - 400 K/uL   Neutrophils Relative % 44 %   Neutro Abs 1.6 (L) 1.7 - 7.7 K/uL   Lymphocytes Relative 44 %   Lymphs Abs 1.5 0.7 - 4.0 K/uL   Monocytes Relative 11 %   Monocytes Absolute 0.4 0.1 - 1.0 K/uL   Eosinophils Relative 1 %   Eosinophils Absolute 0.0 0.0 - 0.7 K/uL   Basophils Relative 0 %   Basophils Absolute 0.0 0.0 - 0.1 K/uL     HEENT: normal Cardio: RRR and no murmur Resp: CTA B/L and unlabored GI: BS positive and NT, ND Extremity:  Pulses positive and No Edema Skin:   Intact and Other No LE discoloration Neuro: Alert/Oriented, Normal Sensory and Abnormal Motor 4/5 R HF, KE ADF, 5/5 on Left side, 5/5 in BUE Musc/Skel:  Normal  Gen NAD   Assessment/Plan: 1. 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 which require 3+ hours per day of interdisciplinary therapy in a comprehensive inpatient rehab setting. Physiatrist is providing close team supervision and 24 hour management of active medical problems listed below. Physiatrist and rehab team continue to assess barriers to discharge/monitor patient progress toward functional and medical goals. FIM: Function - Bathing Position: Shower Body parts bathed by patient: Right arm, Left arm, Chest, Abdomen, Front perineal area, Buttocks, Right upper leg, Left upper leg, Right lower leg, Left lower leg, Back Assist Level: Supervision or verbal cues  Function- Upper Body Dressing/Undressing What is the patient wearing?: Pull over shirt/dress Pull over shirt/dress - Perfomed by patient: Thread/unthread right sleeve, Thread/unthread left sleeve, Put head through opening, Pull shirt over trunk Assist Level:  Set up Set up : To obtain clothing/put away Function - Lower Body Dressing/Undressing What is the patient wearing?: Pants, Shoes Position:  (standing in bathroom) Pants- Performed by patient: Thread/unthread right pants leg, Thread/unthread left pants leg, Pull pants up/down Shoes - Performed by patient: Don/doff right shoe, Don/doff left shoe Assist for footwear: Setup Assist for lower body dressing: Touching or steadying assistance (Pt > 75%)  Function - Toileting Toileting steps completed by patient: Adjust clothing prior to toileting, Performs perineal hygiene, Adjust clothing after toileting Toileting Assistive Devices: Grab bar or rail Assist level: Supervision or verbal cues  Function Midwife transfer assistive device: Grab bar Assist level to toilet: Supervision or verbal cues Assist level from toilet: Supervision or verbal cues  Function - Chair/bed transfer Chair/bed transfer method: Ambulatory Chair/bed transfer assist level: Supervision or verbal cues Chair/bed transfer assistive device: Armrests Chair/bed transfer details: Verbal cues for precautions/safety  Function - Locomotion: Wheelchair Will patient use wheelchair at discharge?: No Function - Locomotion: Ambulation Assistive device: No device Max distance: 300 Assist level: Supervision or verbal cues Assist level: Supervision or verbal cues Assist level: Supervision or verbal cues Assist level: Supervision or verbal cues Assist level: Supervision or verbal cues  Function - Comprehension Comprehension: Auditory Comprehension assist level: Understands complex 90% of the time/cues 10% of the time  Function - Expression Expression: Verbal Expression assist level: Expresses complex ideas: With extra time/assistive device  Function - Social Interaction Social Interaction assist level: Interacts appropriately 90% of the time - Needs monitoring or encouragement for participation or  interaction.  Function - Problem Solving Problem solving assist level: Solves basic 75 - 89% of the time/requires cueing 10 - 24% of the time  Function - Memory Memory assist level: Recognizes or recalls 75 - 89% of the time/requires cueing 10 - 24% of the time Patient normally able to recall (first 3 days only): Current season, Location of own room, Staff names and faces, That he or she is in a hospital 1.  Abnormality of gait, impulsivity secondary to left medial temporal lobe and left thalamic infarct.- Cont therapy             Cont meds 2.  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 Pain Management: Tylenol PRN. 4. Mood: Environmental support. 5. Neuropsych: This patient is capable of making decisions on his own behalf. 6. Skin/Wound Care: Routine skin care. 7. Fluids/Electrolytes/Nutrition: Routine I/Os with follow up chemistries. 8. Dyslipidemia: Lipitor  9. Systolic CHF: Cont toprolol.  Will add ACE if BP permits. BP 111/76 still too low to add ACE-I  Appreciate cardiology consult TEE on Monday    LOS (Days) 2 A FACE TO FACE EVALUATION WAS PERFORMED  KIRSTEINS,ANDREW E 11/05/2015, 8:01 AM

## 2015-11-05 NOTE — Progress Notes (Signed)
Patient Name: Austin Holmes Date of Encounter: 11/05/2015   SUBJECTIVE  Feeling well. No chest pain, sob or palpitations.   CURRENT MEDS . aspirin EC  325 mg Oral Daily  . atorvastatin  40 mg Oral q1800  . enoxaparin (LOVENOX) injection  40 mg Subcutaneous Q24H  . metoprolol succinate  25 mg Oral Daily    OBJECTIVE  Filed Vitals:   11/03/15 1615 11/04/15 0423 11/04/15 1423 11/05/15 0440  BP:  112/76 110/67 111/76  Pulse:  84 100 80  Temp:  98.1 F (36.7 C) 98.6 F (37 C) 98.3 F (36.8 C)  TempSrc:  Oral Oral Oral  Resp:  18 17 17   Height: 6\' 3"  (1.905 m)     Weight: 232 lb 14.4 oz (105.643 kg)     SpO2:  100% 98% 99%    Intake/Output Summary (Last 24 hours) at 11/05/15 1108 Last data filed at 11/05/15 0803  Gross per 24 hour  Intake    240 ml  Output      0 ml  Net    240 ml   Filed Weights   11/03/15 1615  Weight: 232 lb 14.4 oz (105.643 kg)    PHYSICAL EXAM  General: Well developed, well nourished, male in no acute distress Head: Eyes PERRLA, No xanthomas. Normocephalic and atraumatic, oropharynx without edema or exudate.  Lungs: Resp regular and unlabored, CTA. Heart: RRR no s3, s4, or murmurs..  Neck: No carotid bruits. No lymphadenopathy. No JVD. Abdomen: Bowel sounds present, abdomen soft and non-tender without masses or hernias noted. Msk: No spine or cva tenderness. No weakness, no joint deformities or effusions. Extremities: No clubbing, cyanosis or edema. DP/PT/Radials 2+ and equal bilaterally. L thigh mobil, rubbery knot. R thigh textured knot. Neuro: Alert and oriented X 3. No focal deficits noted. Psych: Good affect, responds appropriately Skin: No rashes or lesions noted.  Accessory Clinical Findings  CBC  Recent Labs  11/04/15 0514  WBC 3.5*  NEUTROABS 1.6*  HGB 13.9  HCT 42.2  MCV 85.1  PLT [id number]*   Basic Metabolic Panel  Recent Labs  11/04/15 0514  NA 139  K 3.8  CL 103  CO2 25  GLUCOSE 93  BUN 10    CREATININE 1.23  CALCIUM 9.5   Liver Function Tests  Recent Labs  11/04/15 0514  AST 40  ALT 42  ALKPHOS 32*  BILITOT 1.0  PROT 7.1  ALBUMIN 3.6   No results for input(s): LIPASE, AMYLASE in the last 72 hours. Cardiac Enzymes  Recent Labs  11/02/15 1244  TROPONINI <0.03   BNP Invalid input(s): POCBNP D-Dimer No results for input(s): DDIMER in the last 72 hours. Hemoglobin A1C  Recent Labs  11/02/15 1705  HGBA1C 5.0     Radiology/Studies  Ct Angio Head W/cm &/or Wo Cm  11/01/2015  ADDENDUM REPORT: 11/01/2015 20:36 ADDENDUM: Study discussed by telephone with Dr. Doren Custard STAFFORD on 11/01/2015 at 2030 hours. Electronically Signed   By: Genevie Ann M.D.   On: 11/01/2015 20:36  11/01/2015  CLINICAL DATA:  45 year old male with unsteady gait today, blurred vision, right lower extremity paresthesia. Initial encounter. EXAM: CT ANGIOGRAPHY HEAD AND NECK TECHNIQUE: Multidetector CT imaging of the head and neck was performed using the standard protocol during bolus administration of intravenous contrast. Multiplanar CT image reconstructions and MIPs were obtained to evaluate the vascular anatomy. Carotid stenosis measurements (when applicable) are obtained utilizing NASCET criteria, using the distal internal carotid diameter as the denominator. CONTRAST:  75 mL Isovue 370 COMPARISON:  Head CT without contrast 1636 hours today. FINDINGS: CTA NECK Skeleton:  No acute osseous abnormality identified. Mild paranasal sinus mucosal thickening. Sphenoid sinuses are spared. Tympanic cavities and mastoids are clear. Other neck: Negative lung apices. No superior mediastinal lymphadenopathy. Thyroid, larynx, pharynx, parapharyngeal spaces, retropharyngeal space, sublingual space, submandibular glands, and parotid glands are within normal limits. No cervical lymphadenopathy. Aortic arch: 3 vessel arch configuration. No arch atherosclerosis or great vessel origin stenosis. Right carotid system: Negative.  Left carotid system: Negative. Vertebral arteries:Negative; no proximal subclavian artery stenosis, normal vertebral artery origins and no vertebral artery stenosis in the neck. The left vertebral artery is non dominant. CTA HEAD Posterior circulation: Mildly dominant distal right vertebral artery. Normal PICA origins. No distal vertebral artery stenosis. Patent vertebrobasilar junction. No basilar stenosis. Normal SCA and PCA origins. Both posterior communicating arteries are present, the left is diminutive. There is a 6 mm segment of occlusion in the distal left PCA P2 segment. There is reconstituted flow distally. This is best seen on series 11, image 21. Right PCA branches are within normal limits. Anterior circulation: Both ICA siphons are patent. Both ophthalmic and posterior communicating artery origins are within normal limits. Both carotid termini are patent. The left ACA is non dominant. Anterior communicating artery and ACA branches are within normal limits. Left MCA M1 segment, bifurcation, and left MCA branches are within normal limits. Right MCA M1 segment, bifurcation, and right MCA branches are within normal limits. Venous sinuses: Patent. Anatomic variants: Non dominant left vertebral artery and left ACA. Delayed phase: No abnormal enhancement identified. No cortically based acute infarct identified. No intracranial hemorrhage or mass effect. IMPRESSION: 1. Negative for emergent large vessel occlusion but Positive for segmental Occlusion vs. High-grade Stenosis in the Left PCA P2 segment. There is reconstituted distal left PCA flow. See series 11, image 21. 2. Stable and negative CT appearance of the brain. 3. No atherosclerosis or stenosis in the neck. Negative anterior circulation. Electronically Signed: By: Genevie Ann M.D. On: 11/01/2015 20:27   Ct Head Wo Contrast  11/01/2015  CLINICAL DATA:  Right leg numbness since yesterday EXAM: CT HEAD WITHOUT CONTRAST TECHNIQUE: Contiguous axial images were  obtained from the base of the skull through the vertex without intravenous contrast. COMPARISON:  None. FINDINGS: No acute intracranial abnormality. Specifically, no hemorrhage, hydrocephalus, mass lesion, acute infarction, or significant intracranial injury. No acute calvarial abnormality. Visualized paranasal sinuses and mastoids clear. Orbital soft tissues unremarkable. IMPRESSION: No acute intracranial abnormality. Electronically Signed   By: Rolm Baptise M.D.   On: 11/01/2015 16:40   Ct Angio Neck W/cm &/or Wo/cm  11/01/2015  ADDENDUM REPORT: 11/01/2015 20:36 ADDENDUM: Study discussed by telephone with Dr. Doren Custard STAFFORD on 11/01/2015 at 2030 hours. Electronically Signed   By: Genevie Ann M.D.   On: 11/01/2015 20:36  11/01/2015  CLINICAL DATA:  45 year old male with unsteady gait today, blurred vision, right lower extremity paresthesia. Initial encounter. EXAM: CT ANGIOGRAPHY HEAD AND NECK TECHNIQUE: Multidetector CT imaging of the head and neck was performed using the standard protocol during bolus administration of intravenous contrast. Multiplanar CT image reconstructions and MIPs were obtained to evaluate the vascular anatomy. Carotid stenosis measurements (when applicable) are obtained utilizing NASCET criteria, using the distal internal carotid diameter as the denominator. CONTRAST:  75 mL Isovue 370 COMPARISON:  Head CT without contrast 1636 hours today. FINDINGS: CTA NECK Skeleton:  No acute osseous abnormality identified. Mild paranasal sinus mucosal thickening. Sphenoid sinuses  are spared. Tympanic cavities and mastoids are clear. Other neck: Negative lung apices. No superior mediastinal lymphadenopathy. Thyroid, larynx, pharynx, parapharyngeal spaces, retropharyngeal space, sublingual space, submandibular glands, and parotid glands are within normal limits. No cervical lymphadenopathy. Aortic arch: 3 vessel arch configuration. No arch atherosclerosis or great vessel origin stenosis. Right carotid  system: Negative. Left carotid system: Negative. Vertebral arteries:Negative; no proximal subclavian artery stenosis, normal vertebral artery origins and no vertebral artery stenosis in the neck. The left vertebral artery is non dominant. CTA HEAD Posterior circulation: Mildly dominant distal right vertebral artery. Normal PICA origins. No distal vertebral artery stenosis. Patent vertebrobasilar junction. No basilar stenosis. Normal SCA and PCA origins. Both posterior communicating arteries are present, the left is diminutive. There is a 6 mm segment of occlusion in the distal left PCA P2 segment. There is reconstituted flow distally. This is best seen on series 11, image 21. Right PCA branches are within normal limits. Anterior circulation: Both ICA siphons are patent. Both ophthalmic and posterior communicating artery origins are within normal limits. Both carotid termini are patent. The left ACA is non dominant. Anterior communicating artery and ACA branches are within normal limits. Left MCA M1 segment, bifurcation, and left MCA branches are within normal limits. Right MCA M1 segment, bifurcation, and right MCA branches are within normal limits. Venous sinuses: Patent. Anatomic variants: Non dominant left vertebral artery and left ACA. Delayed phase: No abnormal enhancement identified. No cortically based acute infarct identified. No intracranial hemorrhage or mass effect. IMPRESSION: 1. Negative for emergent large vessel occlusion but Positive for segmental Occlusion vs. High-grade Stenosis in the Left PCA P2 segment. There is reconstituted distal left PCA flow. See series 11, image 21. 2. Stable and negative CT appearance of the brain. 3. No atherosclerosis or stenosis in the neck. Negative anterior circulation. Electronically Signed: By: Genevie Ann M.D. On: 11/01/2015 20:27   Mr Brain Wo Contrast  11/02/2015  CLINICAL DATA:  45 year old male with un steady gait, blurred vision right lower extremity  paresthesias. Subsequent encounter. EXAM: MRI HEAD WITHOUT CONTRAST MRA HEAD WITHOUT CONTRAST TECHNIQUE: Multiplanar, multiecho pulse sequences of the brain and surrounding structures were obtained without intravenous contrast. Angiographic images of the head were obtained using MRA technique without contrast. COMPARISON:  CT angiogram 11/01/2015. 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 No intracranial hemorrhage. Very mild periventricular white matter changes may be related to result of small vessel disease. No age advanced atrophy or hydrocephalus. No intracranial mass lesion noted on this unenhanced exam. Decreased signal intensity of bone marrow (most notable upper cervical spine) may be related to patient's habitus. Correlation with CBC to exclude anemia contributing to this appearance may be considered Cervical medullary junction, pituitary region, pineal region and orbital structures unremarkable. Junction Mild paranasal sinus mucosal thickening. MRA HEAD FINDINGS Narrowing of the cavernous segment of the internal carotid artery greater on left. This is felt to represent overestimation of degree of narrowing given the appearance on recent CT angiogram. Mild irregularity M1 segment middle cerebral artery bilaterally. Minimal bulge superior margin felt to be origin of a vessel rather than aneurysm. Moderate narrowing M2 segment /middle cerebral artery branches bilaterally. Mild to moderate narrowing A1 segment left anterior cerebral artery. Moderate to marked narrowing A2 segment left anterior cerebral artery. Mild irregularity with minimal to mild narrowing portions of right anterior cerebral artery. Moderate narrowing distal vertebral arteries more notable on the left. Nonvisualized posterior inferior cerebellar arteries and anterior inferior  cerebellar arteries bilaterally. Mild to slightly moderate narrowing and irregularity basilar artery.  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 Mild to moderate narrowing of portions of the right posterior cerebral artery. Moderate narrowing superior cerebellar artery bilaterally. 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 Decreased signal intensity of bone marrow (most notable upper cervical spine) may be related to patient's habitus. Correlation with CBC to exclude anemia contributing to this appearance may be considered MRA HEAD MR angiogram appears to overestimate degree of narrowing given the appearance on recent CT angiogram as detailed above. 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 Electronically Signed   By: Genia Del M.D.   On: 11/02/2015 11:54   Mr Jodene Nam Head/brain Wo Cm  11/02/2015  CLINICAL DATA:  45 year old male with un steady gait, blurred vision right lower extremity paresthesias. Subsequent encounter. EXAM: MRI HEAD WITHOUT CONTRAST MRA HEAD WITHOUT CONTRAST TECHNIQUE: Multiplanar, multiecho pulse sequences of the brain and surrounding structures were obtained without intravenous contrast. Angiographic images of the head were obtained using MRA technique without contrast. COMPARISON:  CT angiogram 11/01/2015. 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 No intracranial hemorrhage. Very mild periventricular white matter changes may be related to result of small vessel disease. No age advanced atrophy or hydrocephalus. No intracranial mass lesion noted on this unenhanced exam. Decreased signal intensity of bone marrow (most notable upper cervical spine) may be related to patient's habitus. Correlation with CBC to exclude anemia contributing to this appearance may be considered Cervical medullary junction, pituitary region, pineal region and orbital structures unremarkable. Junction Mild paranasal sinus mucosal thickening. MRA  HEAD FINDINGS Narrowing of the cavernous segment of the internal carotid artery greater on left. This is felt to represent overestimation of degree of narrowing given the appearance on recent CT angiogram. Mild irregularity M1 segment middle cerebral artery bilaterally. Minimal bulge superior margin felt to be origin of a vessel rather than aneurysm. Moderate narrowing M2 segment /middle cerebral artery branches bilaterally. Mild to moderate narrowing A1 segment left anterior cerebral artery. Moderate to marked narrowing A2 segment left anterior cerebral artery. Mild irregularity with minimal to mild narrowing portions of right anterior cerebral artery. Moderate narrowing distal vertebral arteries more notable on the left. Nonvisualized posterior inferior cerebellar arteries and anterior inferior cerebellar arteries bilaterally. Mild to slightly moderate narrowing and irregularity basilar artery. 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 Mild to moderate narrowing of portions of the right posterior cerebral artery. Moderate narrowing superior cerebellar artery bilaterally. 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 Decreased signal intensity of bone marrow (most notable upper cervical spine) may be related to patient's habitus. Correlation with CBC to exclude anemia contributing to this appearance may be considered MRA HEAD MR angiogram appears to overestimate degree of narrowing given the appearance on recent CT angiogram as detailed above. 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 Electronically Signed   By: Genia Del M.D.   On: 11/02/2015 11:54    ASSESSMENT AND PLAN   1. Cardiomyopathy - Echo showed LV EF of 20-25%, diffuse hypokinesis, trivial aortic regur, mild dilated LA and RA. Mild dilated RV. EKG 11/01/15 showed sinus rhythm at rate of 101bpm, LBBB, TWI in inferior lead. No prior EKG to  compare. 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 Advised to stop drinking.  - Started on ASA, lipitor and toprol XL 25mg . No anginal pain or SOB.  - Will need repeat echo in 3 months. Start lisinopril 2.5mg  once BP is stable prior to discharge.  Will need ischemic evaluation at some point given LBBB and T wave abnormality. Will schedule outpatient f/u in 3 weeks and cath afterwards in few weeks. F/u appointment has been schedule.   2. Stroke due to embolism of left posterior cerebral artery ([place]) - Pending result of hypercoagulable labs - TEE is schedule for Monday.   3. HLD (hyperlipidemia)  - Continue statin. Goal less than 70. Will need LFT and lipid panel check in 6 weeks.   4. Right thigh pain -LE doppler negative for DVT on preliminary report.   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 Pending MRI.   Dispo: Will sign off, call with questions. Add lisinopril. F/u as outpatient for ischemic evaluation. TEE Monday.   Signed, Bhagat,Bhavinkumar PA-C Pager [phone]  I have examined the patient and reviewed assessment and plan and discussed with patient.  Agree with above as stated.  TEE as part of stroke workup explained.  All questions answered.  Decrease EtOH.  Start ACE-I.  Medical therapy for cardiomyopathy.  Cath likely in 4-6 weeks.   VARANASI,JAYADEEP S.

## 2015-11-05 NOTE — Progress Notes (Signed)
Physical Therapy Session Note  Patient Details  Name: Austin Holmes MRN: [id number] Date of Birth: 11-Dec-1970  Today's Date: 11/05/2015 PT Individual Time: 0800-0900 PT Individual Time Calculation (min): 60 min   Short Term Goals: Week 1:  PT Short Term Goal 1 (Week 1): =LTG due to estimated length of stay  Skilled Therapeutic Interventions/Progress Updates:    Pt received seated on EOB with RN present, denies pain and agreeable to treatment. Completed eating breakfast and organizing belongings with distant S. Gait to/from gym 2x200' with S. Pt engaged in Wii boxing for dynamic standing balance, UE coordination and aerobic endurance. 3 sets 12 reps squats on wall with stability ball. 3 sets 10 reps single leg bridge. Pt perseverative on RLE swelling; educated on role of strengthening and coordination, improving functional mobility which will reduce swelling over time. Bridging with hamstring curl on stability ball. Requires repetitive cues in all exercises to slow down and demonstrate control and coordination rather than performing quickly to "get it over with". Improved, slower performance of exercises with cues for counting and holding positions. Returned to room with gait as above. Remained seated in recliner at completion of session, all needs within reach. Pt encouraged to perform LE exercises with ankle weights to continue strengthening and reduce "stiffness" that pt perseverates on.   Therapy Documentation Precautions:  Precautions Precautions: Fall Restrictions Weight Bearing Restrictions: No Pain: Pain Assessment Pain Assessment: No/denies pain Pain Score: 0-No pain   See Function Navigator for Current Functional Status.   Therapy/Group: Individual Therapy  Luberta Mutter 11/05/2015, 9:18 AM

## 2015-11-05 NOTE — IPOC Note (Signed)
Overall Plan of Care [facility]) Patient Details Name: Austin Holmes MRN: [id number] DOB: Aug 22, 1970  Admitting Diagnosis: l cva  Hospital Problems: Principal Problem:   Stroke due to embolism of left posterior cerebral artery ([place]) Active Problems:   Cardiomyopathy ([place])   Abnormality of gait   Impulsive   HLD (hyperlipidemia)   Mass of thigh     Functional Problem List: Nursing Edema, Endurance, Motor, Medication Management, Safety  PT Balance, Endurance, Motor, Safety  OT Balance, Cognition, Endurance, Motor, Safety  SLP Cognition  TR         Basic ADL's: OT Grooming, Bathing, Dressing, Toileting     Advanced  ADL's: OT Simple Meal Preparation     Transfers: PT Bed to Chair, Bed Mobility, Car, Furniture, Floor  OT Toilet, Tub/Shower     Locomotion: PT Ambulation, Stairs     Additional Impairments: OT    SLP Social Cognition   Problem Solving, Memory, Attention, Awareness  TR      Anticipated Outcomes Item Anticipated Outcome  Self Feeding Mod I  Swallowing      Basic self-care  Mod I  Toileting  Mod I   Bathroom Transfers Mod I  Bowel/Bladder  Mod I  Transfers  mod I  Locomotion  mod I in home, S in community  Communication     Cognition  Supervision   Pain  n/a  Safety/Judgment  Min assist   Therapy Plan: PT Intensity: Minimum of 1-2 x/day ,45 to 90 minutes PT Frequency: 5 out of 7 days PT Duration Estimated Length of Stay: 5-7 days OT Intensity: Minimum of 1-2 x/day, 45 to 90 minutes OT Frequency: 5 out of 7 days OT Duration/Estimated Length of Stay: 5-7 days SLP Intensity: Minumum of 1-2 x/day, 30 to 90 minutes SLP Frequency: 3 to 5 out of 7 days SLP Duration/Estimated Length of Stay: 5-7 days        Team Interventions: Nursing Interventions Patient/Family Education, Disease Management/Prevention, Cognitive Remediation/Compensation, Medication Management, Discharge Planning  PT interventions Ambulation/gait training,  Training and development officer, Patient/family education, Stair training, Visual/perceptual remediation/compensation, Therapeutic Activities, Therapeutic Exercise, UE/LE Strength taining/ROM, UE/LE Coordination activities, Neuromuscular re-education, Functional mobility training, Community reintegration, Discharge planning, Cognitive remediation/compensation  OT Interventions Training and development officer, Cognitive remediation/compensation, Community reintegration, Discharge planning, Disease mangement/prevention, Engineer, drilling, Functional mobility training, Neuromuscular re-education, Pain management, Patient/family education, Psychosocial support, Self Care/advanced ADL retraining, Therapeutic Activities, Therapeutic Exercise, UE/LE Strength taining/ROM, UE/LE Coordination activities  SLP Interventions Cognitive remediation/compensation, Cueing hierarchy, Functional tasks, Environmental controls, Internal/external aids  TR Interventions    SW/CM Interventions Discharge Planning, Psychosocial Support, Patient/Family Education    Team Discharge Planning: Destination: PT-Home ,OT- Home , SLP-Home Projected Follow-up: PT-None, OT-  Outpatient OT (vs None), SLP-Outpatient SLP, Home Health SLP, Other (comment) (intermittent supervision for medications and finances ) Projected Equipment Needs: PT-None recommended by PT, OT- Tub/shower seat, To be determined, SLP-None recommended by SLP Equipment Details: PT- , OT-  Patient/family involved in discharge planning: PT- Patient,  OT-Patient, SLP-Patient  MD ELOS: 5-7 days Medical Rehab Prognosis:  Excellent Assessment: The patient has been admitted for CIR therapies with the diagnosis of left CVA. The team will be addressing functional mobility, strength, stamina, balance, safety, adaptive techniques and equipment, self-care, bowel and bladder mgt, patient and caregiver education, NMR, visual-spatial awareness, communication, cognition,  adjustment/behavior. Goals have been set at mod I for mobility and self-care and supervision for cognition. [name], MD, FAAPMR      See  Team Conference Notes for weekly updates to the plan of care

## 2015-11-05 NOTE — Progress Notes (Signed)
Occupational Therapy Session Note  Patient Details  Name: Austin Holmes MRN: [id number] Date of Birth: 1970/08/10  Today's Date: 11/05/2015 OT Individual Time: 0930-1030 and 1435-1505 OT Individual Time Calculation (min): 60 min and 30 min   Short Term Goals: Week 1:  OT Short Term Goal 1 (Week 1): STG = LTGs due to ELOS  Skilled Therapeutic Interventions/Progress Updates:    1) ADL retraining with focus on functional mobility and safety during self-care tasks and higher level dynamic balance activities.  Pt ambulated to toilet with supervision and completed toilet transfer and toileting with distant supervision.  Bathing completed at sit > stand level in room shower with setup assist for items and adjusting water temperature.  Pt donned shorts in standing this session with supervision/cues for safety, however pt resistant to cues from therapist.  Ambulated to therapy gym with improved cadence and swing of RUE.  Engaged in dynamic standing balance activity with shooting basketball with pt requiring min guard with quick movements and cues for safety due to slick floor and slick shoes.  Pt with excuses for shoes and denying balance issues.  Engage in table top task while standing on foam block to challenge balance and problem solving during checkers.  Returned to room ambulating while tossing ball into the air.  2) Treatment session with focus on dynamic standing balance and balance reactions.  Engaged in Bi[employer]x Limits of Stability and Random Control.  Pt scored 16% on limits of stability with inability to problem solve and increased impulsivity decreasing pt ability to be receptive to cues from therapist.  Pt often leading with head, requiring verbal and tactile cues to weight shift through legs and hips.  Improved success with random control however still requiring cues for weight shift.  Attempted fall risk on Biodex but pt unable to maintain standing balance when disc moving, therefore  terminated task.  Pt demonstrating improved balance during mobility and functional tasks, however continues to be very impulsive and with decreased awareness of deficits.  Therapy Documentation Precautions:  Precautions Precautions: Fall Restrictions Weight Bearing Restrictions: No Pain: Pain Assessment Pain Assessment: No/denies pain Pain Score: 0-No pain  See Function Navigator for Current Functional Status.   Therapy/Group: Individual Therapy  Simonne Come 11/05/2015, 12:15 PM

## 2015-11-05 NOTE — Progress Notes (Signed)
Social Work Assessment and Plan  Patient Details  Name: Austin Holmes MRN: [id number] Date of Birth: [birth year]  Today's Date: 11/04/2015  Problem List:  Patient Active Problem List   Diagnosis Date Noted  . Stroke due to embolism of left posterior cerebral artery ([place]) 11/03/2015  . Cardiomyopathy ([place]) 11/03/2015  . Abnormality of gait   . Impulsive   . HLD (hyperlipidemia)   . TIA (transient ischemic attack) 11/02/2015  . Acute CVA (cerebrovascular accident) ([place]) 11/02/2015   Past Medical History: No past medical history on file. Past Surgical History:  Past Surgical History  Procedure Laterality Date  . None     Social History:  reports that he has never smoked. He does not have any smokeless tobacco history on file. He reports that he drinks alcohol. He reports that he does not use illicit drugs.  Family / Support Systems Marital Status: Single Patient Roles: Other (Comment), Parent Children: 28 y/o dtr Other Supports: Austin Holmes - mother - [phone] Anticipated Caregiver: mother Ability/Limitations of Caregiver: Mom is retired and can assist. Careers adviser: 24/7 Family Dynamics: supportive family  Social History Preferred language: English Religion: Unknown Read: Yes Write: Yes Employment Status: Employed Name of Employer: [employer] Return to Work Plans: Pt would like to return to work there when he is able. Legal History/Current Legal Issues: none reported Guardian/Conservator: N/A - MD has determined that pt is capable of making his own decisions.   Abuse/Neglect Physical Abuse: Denies Verbal Abuse: Denies Sexual Abuse: Denies Exploitation of patient/patient's resources: Denies Self-Neglect: Denies  Emotional Status Pt's affect, behavior and adjustment status: Pt is reamnining positive and upbeat.  He is determined to get better and does not want to show evidence of having a stroke. Recent Psychosocial Issues: none  reported Psychiatric History: none reported Substance Abuse History: none reported  Patient / Family Perceptions, Expectations & Goals Pt/Family understanding of illness & functional limitations: Pt/mother feel they have a good understanding of pt's condition and do not have any questions/concerns at this time. Premorbid pt/family roles/activities: Pt works and enjoys spending time with friends/family. Anticipated changes in roles/activities/participation: Pt knows he cannot go back to work right away, but would like to return as soon as he is able. Pt/family expectations/goals: Pt wants to be walking normally and wants to get home as soon as possible.    Community Resources Express Scripts: None Premorbid Home Care/DME Agencies: None Transportation available at discharge: pt's mother Resource referrals recommended: Neuropsychology, Support group (specify) ([place] [facility])  Discharge Planning Living Arrangements: Parent, Other relatives Support Systems: Children, Parent Type of Residence: Private residence Insurance Resources: Teacher, adult education Resources: Employment Museum/gallery curator Screen Referred: Yes Living Expenses: Lives with family Money Management: Patient Does the patient have any problems obtaining your medications?: Yes (Describe) (Pt does not currently have insurance.  CSW to provide pt with MATCH at d/c.) Home Management: Pt usually takes care of the outside of the home, but has freinds/family who will be able to help him with this while he is recovering.  Pt's mother takes care of the inside of the home. Patient/Family Preliminary Plans: Pt plans to return to his home with his mother to provide 24/7 supervision.   Barriers to Discharge: Steps, Finances Social Work Anticipated Follow Up Needs: HH/OP, Support Group Expected length of stay: 5 to 7 days  Clinical Impression CSW met with pt to introduce self and role of CSW, as well as to complete assessment.  Pt  was open  and talkative with CSW and was grateful to be doing as well as he is, but to also be on rehab unit.  He does not want to have obvious physical signs of the stroke and is motivated to work hard to obtain that goal.  CSW spoke with pt's mother to confirm that she would be available to provide supervision to pt and she is.  She also set pt up with [facility] in [place], but appt is not until 01-05-16 at 3pm.  CSW recommended that pt become a pt at the [facility] in [place] and gave him application packet, as they can assist with medications as well as getting him seen sooner, but mother wants to stick with what she has set up.  Pt can have [place] for one month's supply of medications.  Pt wants to return to work when he is able.  CSW asked if he needed any paperwork completed for his employer and he will find out.  Pt would be open to a financial counselor seeing him as he has no insurance.  Pt reports supportive family and friends and he will have that when he goes home.  No current questions/concerns/needs at this time.  CSW to continue to follow and assist as needed.  Austin Holmes, Silvestre Mesi 11/04/2015, 10:47 AM

## 2015-11-05 NOTE — Progress Notes (Signed)
Speech Language Pathology Daily Session Note  Patient Details  Name: Austin Holmes MRN: [id number] Date of Birth: 04-28-1971  Today's Date: 11/05/2015 SLP Individual Time: 1300-1400 SLP Individual Time Calculation (min): 60 min  Short Term Goals: Week 1: SLP Short Term Goal 1 (Week 1): STG=LTG due to ELOS  Skilled Therapeutic Interventions:  Pt was seen for skilled ST targeting cognitive goals.  SLP facilitated the session with a medication management task targeting functional problem solving and recall of daily information.  Pt did not recall function of new medications when named but was able to utilize a written list to organize pills into a BID pill box for 100% accuracy with mod I.  Pt expressed concerns about getting back to work as soon as possible and SLP reviewed and reinforced current limitations per yesterday's evaluation which may make returning to work difficult.  Pt verbalized understanding.  SLP provided skilled education regarding memory compensatory strategies.  Handout was given to pt to maximize carryover in the home environment.  All questions answered to pt's satisfaction at this time.  Pt left in recliner in room with call bell within reach.  Continue per current plan of care.    Function:  Eating Eating                 Cognition Comprehension Comprehension assist level: Understands complex 90% of the time/cues 10% of the time  Expression   Expression assist level: Expresses complex ideas: With extra time/assistive device  Social Interaction Social Interaction assist level: Interacts appropriately 90% of the time - Needs monitoring or encouragement for participation or interaction.  Problem Solving Problem solving assist level: Solves basic 90% of the time/requires cueing < 10% of the time  Memory Memory assist level: Recognizes or recalls 75 - 89% of the time/requires cueing 10 - 24% of the time    Pain Pain Assessment Pain Assessment: No/denies  pain  Therapy/Group: Individual Therapy  Kirstein Baxley, Selinda Orion 11/05/2015, 3:47 PM

## 2015-11-06 ENCOUNTER — Inpatient Hospital Stay (HOSPITAL_COMMUNITY): Payer: Self-pay | Admitting: Speech Pathology

## 2015-11-06 ENCOUNTER — Inpatient Hospital Stay (HOSPITAL_COMMUNITY): Payer: Self-pay | Admitting: Occupational Therapy

## 2015-11-06 ENCOUNTER — Inpatient Hospital Stay (HOSPITAL_COMMUNITY): Payer: Self-pay | Admitting: Physical Therapy

## 2015-11-06 DIAGNOSIS — I950 Idiopathic hypotension: Secondary | ICD-10-CM

## 2015-11-06 NOTE — Progress Notes (Signed)
Occupational Therapy Session Note  Patient Details  Name: Austin Holmes MRN: [id number] Date of Birth: 08-05-1970  Today's Date: 11/06/2015 OT Individual Time: [id number] OT Individual Time Calculation (min): 90 min    Short Term Goals: Week 1:  OT Short Term Goal 1 (Week 1): STG = LTGs due to ELOS  Skilled Therapeutic Interventions/Progress Updates:  Upon entering the room, pt seated on EOB and awaiting therapist. Pt with no c/o pain this session. Pt ambulated 250' without use of AD and with supervision only over carpet and inclined surface without difficulty. Pt educated on secondary stroke risk factors with pt verbalizing understanding. In ADL apartment, pt engaged in bed making with supervision for safety. Kitchen task to [employer] routine simple meal of scrambled eggs on stove. Pt required min verbal cues for hygiene and pt also attempting to place spoon with raw egg on it into butter for cooking. Pt educated on cross contamination of cooking. Pt unable to navigate self back to room without mod verbal guidance cues. Pt returned to room where he was given the SLUMS examination for detecting mild cognitive impairment. Pt scored 16/30 with significant deficits in questions related to memory and problem solving. OT discussed finding with pt and related these results to why therapist does not recommend pt return to work immediately after discharge from [facility]. Pt verbalized understanding. Pt remained in room with call bell and all needed items within reach upon exiting the room.   Therapy Documentation Precautions:  Precautions Precautions: Fall Restrictions Weight Bearing Restrictions: No  Pain: Pain Assessment Pain Assessment: 0-10 (reports edema, soreness R thigh) Pain Score: 1  Pain Type: Acute pain Pain Location: Leg Pain Orientation: Right;Upper Pain Descriptors / Indicators: Sore Pain Onset: On-going Pain Intervention(s): Other (Comment) (kinesiotape)     See Function Navigator for Current Functional Status.   Therapy/Group: Individual Therapy  Austin Holmes 11/06/2015, 12:37 PM

## 2015-11-06 NOTE — Progress Notes (Signed)
Physical Therapy Session Note  Patient Details  Name: Austin Holmes MRN: [id number] Date of Birth: Aug 26, 1970  Today's Date: 11/06/2015 PT Individual Time: 1109-1204 PT Individual Time Calculation (min): 55 min   Short Term Goals: Week 1:  PT Short Term Goal 1 (Week 1): =LTG due to estimated length of stay  Skilled Therapeutic Interventions/Progress Updates:    Pt received seated EOB.  Pt performed all transfers in room Mod I; performed ambulation in controlled environment x 150' with supervision while discussing home set up and job requirements.  Performed stair negotiation up/down 2 flights of stairs (24) with one rail with pt self selecting alternating sequence and supervision.  Discussed falls risk after CVA with pt and educated pt on how to perform floor <> furniture transfer.  Pt gave repeat demonstration Mod I.  Pt continued to report soreness and a sensation of edema in R thigh.  Pt educated on purpose and use of Kinesiotape; pt agreeable to try.  Kinesiotape applied to R thigh in criss cross pattern to facilitate edema reduction and pain management.  Performed higher level, dynamic balance, coordination and gait training with pt performing obstacle course in hallway with therapist verbalizing and demonstrating course sequence and having pt return demonstrate multiple times while performing cognitive dual task.  Pt required supervision and 75% verbal cues to complete course with correct sequence while performing dual task; when able to focus on course only pt only required 25% cues to recall.  Added in dual task of carrying heavy boxes and picking up items off the floor and returning them to their correct storage in the gym.  Pt also performed more dynamic balance and coordination training with coordination drills on floor ladder with supervision-min A and max verbal and visual cues to recall sequence.  At end of session discussed safety issues with pt when returning to work and performing  multiple tasks at once.  Pt feels tasks at his job are more automatic and will not be as difficult.  Returned to room and pt left EOB with all items within reach.  Pt demonstrating intact long term memory but demonstrates impaired recall of new information, sequencing and difficulty with dual task and alternating attention during tasks.  Therapy Documentation Precautions:  Precautions Precautions: Fall Restrictions Weight Bearing Restrictions: No Pain: Pain Assessment Pain Assessment: 0-10 (reports edema, soreness R thigh) Pain Score: 1  Pain Type: Acute pain Pain Location: Leg Pain Orientation: Right;Upper Pain Descriptors / Indicators: Sore Pain Onset: On-going Pain Intervention(s): Other (Comment) (kinesiotape)  See Function Navigator for Current Functional Status.   Therapy/Group: Individual Therapy  Raylene Everts [facility] 11/06/2015, 12:14 PM

## 2015-11-06 NOTE — Progress Notes (Signed)
Subjective/Complaints: Patient seen sleeping in bed this morning. He is easily arousable. He states that he feels strongly doing well.  ROS- denies CP, SOB, N/V/D,   Objective: Vital Signs: Blood pressure 99/61, pulse 78, temperature 98.6 F (37 C), temperature source Oral, resp. rate 18, height _0  (1.905 m), weight 105.643 kg (232 lb 14.4 oz), SpO2 100 %. No results found. Results for orders placed or performed during the hospital encounter of 11/03/15 (from the past 72 hour(s))  Comprehensive metabolic panel     Status: Abnormal   Collection Time: 11/04/15  5:14 AM  Result Value Ref Range   Sodium 139 135 - 145 mmol/L   Potassium 3.8 3.5 - 5.1 mmol/L   Chloride 103 101 - 111 mmol/L   CO2 25 22 - 32 mmol/L   Glucose, Bld 93 65 - 99 mg/dL   BUN 10 6 - 20 mg/dL   Creatinine, Ser 1.23 0.61 - 1.24 mg/dL   Calcium 9.5 8.9 - 10.3 mg/dL   Total Protein 7.1 6.5 - 8.1 g/dL   Albumin 3.6 3.5 - 5.0 g/dL   AST 40 15 - 41 U/L   ALT 42 17 - 63 U/L   Alkaline Phosphatase 32 (L) 38 - 126 U/L   Total Bilirubin 1.0 0.3 - 1.2 mg/dL   GFR calc non Af Amer >60 >60 mL/min   GFR calc Af Amer >60 >60 mL/min    Comment: (NOTE) The eGFR has been calculated using the CKD EPI equation. This calculation has not been validated in all clinical situations. eGFR's persistently <60 mL/min signify possible Chronic Kidney Disease.    Anion gap 11 5 - 15  CBC WITH DIFFERENTIAL     Status: Abnormal   Collection Time: 11/04/15  5:14 AM  Result Value Ref Range   WBC 3.5 (L) 4.0 - 10.5 K/uL   RBC 4.96 4.22 - 5.81 MIL/uL   Hemoglobin 13.9 13.0 - 17.0 g/dL   HCT 42.2 39.0 - 52.0 %   MCV 85.1 78.0 - 100.0 fL   MCH 28.0 26.0 - 34.0 pg   MCHC 32.9 30.0 - 36.0 g/dL   RDW 14.3 11.5 - 15.5 %   Platelets 142 (L) 150 - 400 K/uL   Neutrophils Relative % 44 %   Neutro Abs 1.6 (L) 1.7 - 7.7 K/uL   Lymphocytes Relative 44 %   Lymphs Abs 1.5 0.7 - 4.0 K/uL   Monocytes Relative 11 %   Monocytes Absolute 0.4 0.1 -  1.0 K/uL   Eosinophils Relative 1 %   Eosinophils Absolute 0.0 0.0 - 0.7 K/uL   Basophils Relative 0 %   Basophils Absolute 0.0 0.0 - 0.1 K/uL     HEENT: Normocephalic. Atraumatic. Cardio: RRR and no murmur Resp: CTA B/L and unlabored GI: BS positive and NT, ND Skin:   Intact. warm and dry. Neuro: Alert/Oriented Motor: 5/5 throughout. Musc/Skel:  No edema. No tenderness. Gen NAD. Vital signs reviewed. Psych: Impulsive and slightly anxious.  Assessment/Plan: 1. 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 which require 3+ hours per day of interdisciplinary therapy in a comprehensive inpatient rehab setting. Physiatrist is providing close team supervision and 24 hour management of active medical problems listed below. Physiatrist and rehab team continue to assess barriers to discharge/monitor patient progress toward functional and medical goals. FIM: Function - Bathing Position: Shower Body parts bathed by patient: Right arm, Left arm, Chest, Abdomen, Front perineal area, Buttocks, Right upper leg, Left upper  leg, Right lower leg, Left lower leg, Back Assist Level: Set up Set up : To obtain items, To adjust water temperature  Function- Upper Body Dressing/Undressing What is the patient wearing?: Pull over shirt/dress Pull over shirt/dress - Perfomed by patient: Thread/unthread right sleeve, Thread/unthread left sleeve, Put head through opening, Pull shirt over trunk Assist Level: Set up Set up : To obtain clothing/put away Function - Lower Body Dressing/Undressing What is the patient wearing?: Pants, Shoes, Socks Position:  (standing for pants and sitting socks and shoes) Pants- Performed by patient: Thread/unthread right pants leg, Thread/unthread left pants leg, Pull pants up/down Socks - Performed by patient: Don/doff right sock, Don/doff left sock Shoes - Performed by patient: Don/doff right shoe, Don/doff left shoe Assist for  footwear: Setup Assist for lower body dressing: Set up, Supervision or verbal cues Set up : To obtain clothing/put away  Function - Toileting Toileting steps completed by patient: Adjust clothing prior to toileting, Performs perineal hygiene, Adjust clothing after toileting Toileting Assistive Devices: Grab bar or rail Assist level: Supervision or verbal cues  Function - Air cabin crew transfer assistive device: Grab bar Assist level to toilet: Supervision or verbal cues Assist level from toilet: Supervision or verbal cues  Function - Chair/bed transfer Chair/bed transfer method: Ambulatory Chair/bed transfer assist level: Supervision or verbal cues Chair/bed transfer assistive device: Armrests Chair/bed transfer details: Verbal cues for precautions/safety  Function - Locomotion: Wheelchair Will patient use wheelchair at discharge?: No Function - Locomotion: Ambulation Assistive device: No device Max distance: 175 Assist level: Supervision or verbal cues Assist level: Supervision or verbal cues Assist level: Supervision or verbal cues Assist level: Supervision or verbal cues Assist level: Supervision or verbal cues  Function - Comprehension Comprehension: Auditory Comprehension assist level: Understands complex 90% of the time/cues 10% of the time  Function - Expression Expression: Verbal Expression assist level: Expresses complex ideas: With extra time/assistive device  Function - Social Interaction Social Interaction assist level: Interacts appropriately 90% of the time - Needs monitoring or encouragement for participation or interaction.  Function - Problem Solving Problem solving assist level: Solves basic 90% of the time/requires cueing < 10% of the time  Function - Memory Memory assist level: Recognizes or recalls 75 - 89% of the time/requires cueing 10 - 24% of the time Patient normally able to recall (first 3 days only): Current season, Location of own  room, Staff names and faces, That he or she is in a hospital 1.  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  Cont therapy             Cont meds 2.  DVT Prophylaxis/Anticoagulation: Mechanical: Sequential compression devices, below knee Bilateral lower extremities, RLE no DVT. Pharmaceutical: Lovenox 3. Pain Management: Tylenol PRN. 4. Mood: Environmental support. 5. Neuropsych: This patient is capable of making decisions on his own behalf. 6. Skin/Wound Care: Routine skin care. 7. Fluids/Electrolytes/Nutrition: Routine I/Os 8. Dyslipidemia: Lipitor 9. Systolic CHF: Cont toprolol.  Will add ACE if BP permits.   Appreciate cardiology consult: Lisinopril 2.5 when BP permits, SBP 99 this AM  TEE on Monday   Follow as outpatient for ischemic evaluation 10.  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

## 2015-11-06 NOTE — Progress Notes (Signed)
Speech Language Pathology Daily Session Note  Patient Details  Name: Austin Holmes MRN: [id number] Date of Birth: [birth year]  Today's Date: 11/06/2015 SLP Individual Time: 35-1450 SLP Individual Time Calculation (min): 45 min  Short Term Goals: Week 1: SLP Short Term Goal 1 (Week 1): STG=LTG due to ELOS  Skilled Therapeutic Interventions:  Pt was seen for skilled ST targeting cognitive goals.  Pt's mother was present at the beginning of today's therapy session.  SLP initiated skilled education with pt's mother regarding results of cognitive assessment and current goals being addressed in ST while inpatient.  Pt's mother reported that she had not noticed any changes in pt's cognition since stroke but was amenable to education being provided and SLP's recommendations.  Pt again verbalized wanting to go back to work as soon as possible and SLP reiterated her concerns given his memory and problem solving deficits in combination with decreased anticipatory awareness of impairments.  Pt verbalized understanding but SLP will continue to reinforce education on an ongoing basis prior to discharge home.  SLP facilitated the session with money management tasks targeting semi-complex functional problem solving.  Pt was 60% accurate for counting money and making change on money management subtests of ALFA standardized cognitive assessment.  Accuracy improved to 70% with supervision cues and to 90% with min assist verbal cues for organization when completing math calculations.  Pt also required min-mod assist verbal cues for organization and error awareness when completing functional math calculations from word problems.  Pt was able to balance a checkbook with mod assist verbal cues again for organization and error awareness due to impulsivity.  Question some baseline level of impairment for higher level functional math.  Pt was mod I for route recall when ambulating to his room from [place] treatment room.  Pt  left in recliner with mother at bedside.  Continue per current plan of care.     Function:  Eating Eating                 Cognition Comprehension Comprehension assist level: Understands complex 90% of the time/cues 10% of the time  Expression   Expression assist level: Expresses complex ideas: With extra time/assistive device  Social Interaction Social Interaction assist level: Interacts appropriately 90% of the time - Needs monitoring or encouragement for participation or interaction.  Problem Solving Problem solving assist level: Solves basic 75 - 89% of the time/requires cueing 10 - 24% of the time  Memory Memory assist level: Recognizes or recalls 75 - 89% of the time/requires cueing 10 - 24% of the time    Pain Pain Assessment Pain Assessment: No/denies pain  Therapy/Group: Individual Therapy  Caydee Talkington, Selinda Orion 11/06/2015, 3:46 PM

## 2015-11-07 DIAGNOSIS — C499 Malignant neoplasm of connective and soft tissue, unspecified: Secondary | ICD-10-CM | POA: Diagnosis present

## 2015-11-07 NOTE — Progress Notes (Signed)
Subjective/Complaints: Patient seen this morning lying in bed sleeping. He is easily arousable. He states he feels cold but otherwise is okay.  ROS- denies fevers, chills, CP, SOB, N/V/D   Objective: Vital Signs: Blood pressure 116/80, pulse 69, temperature 98 F (36.7 C), temperature source Oral, resp. rate 18, height 6\' 3"  (1.905 m), weight 105.643 kg (232 lb 14.4 oz), SpO2 98 %. Mr Femur Left W Wo Contrast  11/06/2015  CLINICAL DATA:  Left lower extremity, medial left thigh mass EXAM: MR OF THE LEFT LOWER EXTREMITY WITHOUT AND WITH CONTRAST TECHNIQUE: Multiplanar, multisequence MR imaging of the lower left extremity was performed both before and after administration of intravenous contrast. CONTRAST:  75mL MULTIHANCE GADOBENATE DIMEGLUMINE 529 MG/ML IV SOLN COMPARISON:  None. FINDINGS: No acute fracture, dislocation or avascular necrosis. No periosteal reaction or bone destruction. Mild linear T2 hyperintense signal in the right femoral neck likely reflecting red marrow. Well-circumscribed 7.1 x 7.3 x 9.2 cm nonenhancing mass just posterior to the left sartorius muscle adjacent to the neurovascular bundle. The mass is predominantly T2 hyperintense with interspersed areas of T1 hyperintensity consistent with fat. No other soft tissue mass. Muscles are normal in signal. No muscle edema or atrophy. No other fluid collection or hematoma. IMPRESSION: 1. Well-circumscribed 7.1 x 7.3 x 9.2 cm nonenhancing mass just posterior to the left sartorius muscle adjacent to the neurovascular bundle. The mass is predominantly T2 hyperintense with interspersed areas of T1 hyperintensity consistent with fat. 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 Electronically Signed   By: Kathreen Devoid   On: 11/06/2015 10:57   No results found for this or any previous visit (from the past 72 hour(s)).   HEENT: Normocephalic. Atraumatic. Cardio: RRR and no murmur Resp: CTA B/L and unlabored GI: BS positive and NT,  ND Skin:   Intact. warm and dry. Neuro: Alert/Oriented Motor: 5/5 throughout. Musc/Skel:  No edema. No tenderness. Gen NAD. Vital signs reviewed. Psych: Impulsive and slightly anxious.  Assessment/Plan: 1. 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 which require 3+ hours per day of interdisciplinary therapy in a comprehensive inpatient rehab setting. Physiatrist is providing close team supervision and 24 hour management of active medical problems listed below. Physiatrist and rehab team continue to assess barriers to discharge/monitor patient progress toward functional and medical goals. FIM: Function - Bathing Position: Shower Body parts bathed by patient: Right arm, Left arm, Chest, Abdomen, Front perineal area, Buttocks, Right upper leg, Left upper leg, Right lower leg, Left lower leg, Back Assist Level: Set up Set up : To obtain items, To adjust water temperature  Function- Upper Body Dressing/Undressing What is the patient wearing?: Pull over shirt/dress Pull over shirt/dress - Perfomed by patient: Thread/unthread right sleeve, Thread/unthread left sleeve, Put head through opening, Pull shirt over trunk Assist Level: Set up Set up : To obtain clothing/put away Function - Lower Body Dressing/Undressing What is the patient wearing?: Pants, Shoes, Socks Position:  (standing for pants and sitting socks and shoes) Pants- Performed by patient: Thread/unthread right pants leg, Thread/unthread left pants leg, Pull pants up/down Socks - Performed by patient: Don/doff right sock, Don/doff left sock Shoes - Performed by patient: Don/doff right shoe, Don/doff left shoe Assist for footwear: Setup Assist for lower body dressing: Set up, Supervision or verbal cues Set up : To obtain clothing/put away  Function - Toileting Toileting steps completed by patient: Adjust clothing prior to toileting, Performs perineal hygiene, Adjust clothing after  toileting Toileting  Assistive Devices: Grab bar or rail Assist level: Supervision or verbal cues  Function - Air cabin crew transfer assistive device: Grab bar Assist level to toilet: Supervision or verbal cues Assist level from toilet: Supervision or verbal cues  Function - Chair/bed transfer Chair/bed transfer method: Ambulatory Chair/bed transfer assist level: No Help, no cues, assistive device, takes more than a reasonable amount of time Chair/bed transfer assistive device: Armrests Chair/bed transfer details: Verbal cues for precautions/safety  Function - Locomotion: Wheelchair Will patient use wheelchair at discharge?: No Function - Locomotion: Ambulation Assistive device: No device Max distance: 300+ Assist level: Supervision or verbal cues Assist level: Supervision or verbal cues Assist level: Supervision or verbal cues Assist level: Supervision or verbal cues Assist level: Supervision or verbal cues  Function - Comprehension Comprehension: Auditory Comprehension assist level: Follows complex conversation/direction with no assist  Function - Expression Expression: Verbal Expression assist level: Expresses complex ideas: With no assist  Function - Social Interaction Social Interaction assist level: Interacts appropriately with others - No medications needed.  Function - Problem Solving Problem solving assist level: Solves complex problems: Recognizes & self-corrects  Function - Memory Memory assist level: Complete Independence: No helper Patient normally able to recall (first 3 days only): Current season 1.  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  Cont therapy             Cont meds 2.  DVT Prophylaxis/Anticoagulation: Mechanical: Sequential compression devices, below knee Bilateral lower extremities, RLE no DVT. Pharmaceutical: Lovenox 3. Pain Management: Tylenol PRN. 4. Mood: Environmental  support. 5. Neuropsych: This patient is capable of making decisions on his own behalf. 6. Skin/Wound Care: Routine skin care. 7. Fluids/Electrolytes/Nutrition: Routine I/Os 8. Dyslipidemia: Lipitor 9. Systolic CHF: Cont toprolol.  Will add ACE if BP permits.   Appreciate cardiology consult: Lisinopril 2.5 when BP permits, SBP [id number] this AM  TEE on Monday   Follow as outpatient for ischemic evaluation 10.  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

## 2015-11-08 ENCOUNTER — Other Ambulatory Visit: Payer: Self-pay | Admitting: Physician Assistant

## 2015-11-08 ENCOUNTER — Encounter: Payer: Self-pay | Admitting: *Deleted

## 2015-11-08 ENCOUNTER — Inpatient Hospital Stay (HOSPITAL_COMMUNITY): Payer: Self-pay | Admitting: Occupational Therapy

## 2015-11-08 ENCOUNTER — Other Ambulatory Visit: Payer: Self-pay | Admitting: Oncology

## 2015-11-08 ENCOUNTER — Inpatient Hospital Stay (HOSPITAL_COMMUNITY): Payer: Self-pay | Admitting: Physical Therapy

## 2015-11-08 ENCOUNTER — Inpatient Hospital Stay (HOSPITAL_COMMUNITY): Payer: Self-pay | Admitting: Speech Pathology

## 2015-11-08 DIAGNOSIS — I5189 Other ill-defined heart diseases: Secondary | ICD-10-CM

## 2015-11-08 DIAGNOSIS — C499 Malignant neoplasm of connective and soft tissue, unspecified: Secondary | ICD-10-CM

## 2015-11-08 DIAGNOSIS — Z7289 Other problems related to lifestyle: Secondary | ICD-10-CM

## 2015-11-08 DIAGNOSIS — I5021 Acute systolic (congestive) heart failure: Secondary | ICD-10-CM

## 2015-11-08 DIAGNOSIS — I5022 Chronic systolic (congestive) heart failure: Secondary | ICD-10-CM | POA: Insufficient documentation

## 2015-11-08 DIAGNOSIS — R2241 Localized swelling, mass and lump, right lower limb: Secondary | ICD-10-CM

## 2015-11-08 MED ORDER — LOSARTAN POTASSIUM 50 MG PO TABS
25.0000 mg | ORAL_TABLET | Freq: Every day | ORAL | Status: DC
  Administered 2015-11-08 – 2015-11-09 (×2): 25 mg via ORAL
  Filled 2015-11-08 (×2): qty 1

## 2015-11-08 NOTE — Progress Notes (Addendum)
Occupational Therapy Discharge Summary  Patient Details  Name: Austin Holmes MRN: [id number] Date of Birth: 03-16-1971  Patient has met 12 of 12 long term goals due to improved activity tolerance, improved balance, postural control, ability to compensate for deficits, functional use of  RIGHT upper and RIGHT lower extremity, improved awareness and improved coordination.  Patient to discharge at overall Modified Independent level with basic ADLs, Supervision for cooking and higher level cognitive tasks.  Patient's care partner is independent to provide the necessary cognitive assistance at discharge.    Reasons goals not met: N/A  Recommendation:  Patient will not require OT follow up at this time.  Equipment: No equipment provided  Reasons for discharge: treatment goals met and discharge from hospital  Patient/family agrees with progress made and goals achieved: Yes  OT Discharge Precautions/Restrictions  Precautions Precautions: Fall General   Vital Signs Therapy Vitals Temp: 98.7 F (37.1 C) Temp Source: Oral Pulse Rate: 72 Resp: 19 BP: 118/66 mmHg Patient Position (if appropriate): Sitting Oxygen Therapy SpO2: 100 % O2 Device: Not Delivered Pain  Pt with no c/o pain Vision/Perception  Vision- History Baseline Vision/History: No visual deficits Patient Visual Report: No change from baseline Vision- Assessment Vision Assessment?: No apparent visual deficits Eye Alignment: Within Functional Limits Ocular Range of Motion: Within Functional Limits Alignment/Gaze Preference: Within Defined Limits Tracking/Visual Pursuits: Able to track stimulus in all quads without difficulty Saccades: Within functional limits Depth Perception:  [facility]) Perception Comments: WFL  Cognition Overall Cognitive Status: Impaired/Different from baseline Arousal/Alertness: Awake/alert Orientation Level: Oriented X4 Attention: Selective Selective Attention: Impaired Selective  Attention Impairment: Functional basic;Verbal basic Memory: Impaired Memory Impairment: Storage deficit;Retrieval deficit Awareness: Appears intact Problem Solving: Impaired Problem Solving Impairment: Functional basic Safety/Judgment: Impaired Comments: slightly impulsive with decreased awareness of deficits  Sensation Sensation Light Touch: Appears Intact Stereognosis: Not tested Hot/Cold: Not tested Coordination Gross Motor Movements are Fluid and Coordinated: Yes Fine Motor Movements are Fluid and Coordinated: No Finger Nose Finger Test: [facility] 9 Hole Peg Test: Rt: 34 seconds, Lt: 23 seconds. Mild impaired coordination on Rt with difficulty picking up pegs Extremity/Trunk Assessment RUE Assessment RUE Assessment: Within Functional Limits LUE Assessment LUE Assessment: Within Functional Limits   See Function Navigator for Current Functional Status.  Simonne Come 11/08/2015, 4:20 PM

## 2015-11-08 NOTE — Progress Notes (Signed)
Subjective/Complaints: Patient seen in the gym working with OT. He notes he was called because there is a drafter coming in his room, but otherwise feels well. He denies fevers chills.  ROS- denies fevers, chills, CP, SOB, N/V/D   Objective: Vital Signs: Blood pressure 129/77, pulse 76, temperature 98.1 F (36.7 C), temperature source Oral, resp. rate 20, height 6\' 3"  (1.905 m), weight 105.643 kg (232 lb 14.4 oz), SpO2 100 %. No results found. No results found for this or any previous visit (from the past 72 hour(s)).   HEENT: Normocephalic. Atraumatic. Cardio: RRR and no murmur Resp: CTA B/L and unlabored GI: BS positive and NT, ND Skin:   Intact. warm and dry. Neuro: Alert/Oriented Motor: 5/5 throughout. Musc/Skel:  No edema. No tenderness. Gen NAD. Vital signs reviewed. Psych: Impulsive and slightly anxious.  Assessment/Plan: 1. 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 which require 3+ hours per day of interdisciplinary therapy in a comprehensive inpatient rehab setting. Physiatrist is providing close team supervision and 24 hour management of active medical problems listed below. Physiatrist and rehab team continue to assess barriers to discharge/monitor patient progress toward functional and medical goals. FIM: Function - Bathing Position: Shower Body parts bathed by patient: Right arm, Left arm, Chest, Abdomen, Front perineal area, Buttocks, Right upper leg, Left upper leg, Right lower leg, Left lower leg, Back Assist Level: More than reasonable time (per pt report) Set up : To obtain items, To adjust water temperature  Function- Upper Body Dressing/Undressing What is the patient wearing?: Pull over shirt/dress Pull over shirt/dress - Perfomed by patient: Thread/unthread right sleeve, Thread/unthread left sleeve, Put head through opening, Pull shirt over trunk Assist Level: No help, No cues (per pt report) Set up : To obtain  clothing/put away Function - Lower Body Dressing/Undressing What is the patient wearing?: Pants, Shoes, Socks Position:  (standing for pants and sitting socks and shoes) Pants- Performed by patient: Thread/unthread right pants leg, Thread/unthread left pants leg, Pull pants up/down Socks - Performed by patient: Don/doff right sock, Don/doff left sock Shoes - Performed by patient: Don/doff right shoe, Don/doff left shoe Assist for footwear: Independent Assist for lower body dressing: No Help, No cues (per pt report) Set up : To obtain clothing/put away  Function - Toileting Toileting steps completed by patient: Adjust clothing prior to toileting, Performs perineal hygiene, Adjust clothing after toileting Toileting Assistive Devices: Grab bar or rail Assist level: More than reasonable time  Function - Air cabin crew transfer assistive device: Grab bar Assist level to toilet: No Help, no cues, assistive device, takes more than a reasonable amount of time Assist level from toilet: No Help, no cues, assistive device, takes more than a reasonable amount of time  Function - Chair/bed transfer Chair/bed transfer method: Ambulatory Chair/bed transfer assist level: No Help, no cues, assistive device, takes more than a reasonable amount of time Chair/bed transfer assistive device: Armrests Chair/bed transfer details: Verbal cues for precautions/safety  Function - Locomotion: Wheelchair Will patient use wheelchair at discharge?: No Function - Locomotion: Ambulation Assistive device: No device Max distance: 300+ Assist level: Supervision or verbal cues Assist level: Supervision or verbal cues Assist level: Supervision or verbal cues Assist level: Supervision or verbal cues Assist level: Supervision or verbal cues  Function - Comprehension Comprehension: Auditory Comprehension assist level: Follows complex conversation/direction with no assist  Function - Expression Expression:  Verbal Expression assist level: Expresses complex ideas: With no assist  Function - Social Interaction  Social Interaction assist level: Interacts appropriately with others - No medications needed.  Function - Problem Solving Problem solving assist level: Solves complex problems: Recognizes & self-corrects  Function - Memory Memory assist level: Complete Independence: No helper Patient normally able to recall (first 3 days only): Current season 1.  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  Cont therapy             Cont meds 2.  DVT Prophylaxis/Anticoagulation: Mechanical: Sequential compression devices, below knee Bilateral lower extremities, RLE no DVT. Pharmaceutical: Lovenox 3. Pain Management: Tylenol PRN. 4. Mood: Environmental support. 5. Neuropsych: This patient is capable of making decisions on his own behalf. 6. Skin/Wound Care: Routine skin care. 7. Fluids/Electrolytes/Nutrition: Routine I/Os 8. Dyslipidemia: Lipitor 9. Systolic CHF: Cont toprolol.     Appreciate cardiology consult: Will start Lisinopril 2.5 tomorrow due to plan for TEE today  TEE today   Follow as outpatient for ischemic evaluation 10.  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

## 2015-11-08 NOTE — Progress Notes (Signed)
Speech Language Pathology Daily Session Note  Patient Details  Name: Austin Holmes MRN: [id number] Date of Birth: 1971-01-06  Today's Date: 11/08/2015 SLP Individual Time: [id number] SLP Individual Time Calculation (min): 40 min  Short Term Goals: Week 1: SLP Short Term Goal 1 (Week 1): STG=LTG due to ELOS  Skilled Therapeutic Interventions: Skilled treatment session focused on addressing cognition goals an wrap up of education. SLP facilitated session by providing a new learning task with verbal and visual instructions as well as repetitions.  Patient benefited from numerous repetitions as well as the implementation of association strategies to facilitate carryover/recall of new learning.  SLP discussed the importance of memory compensatory strategies after discharge and patient able to identify 2. Patient will have support necessary from mother and education complete.  Recommend discharge from SLP services.    Function:  Cognition Comprehension Comprehension assist level: Follows complex conversation/direction with extra time/assistive device  Expression   Expression assist level: Expresses complex ideas: With no assist  Social Interaction Social Interaction assist level: Interacts appropriately with others - No medications needed.  Problem Solving Problem solving assist level: Solves complex problems: With extra time  Memory Memory assist level: More than reasonable amount of time    Pain Pain Assessment Pain Assessment: No/denies pain  Therapy/Group: Individual Therapy  Carmelia Roller., [place] [id number]  [place] 11/08/2015, 4:59 PM

## 2015-11-08 NOTE — Progress Notes (Signed)
Speech Language Pathology Discharge Summary  Patient Details  Name: DENSEL KRONICK MRN: [id number] Date of Birth: 1971/01/26  Patient has met 4 of 4 long term goals.  Patient to discharge at overall Supervision level.  Reasons goals not met: N/A   Clinical Impression/Discharge Summary:    Patient has made functional gains during this rehab admission and has met 4 out of 4 long term goals due to improved functional abilities.  Patient is currently an overall Supervision for mildly complex and complex tasks and requires full Supervision/assist for medication management.  Patient and family education has been completed and patient will discharge home with the needed Supervision/assist from his mother.  Mother reports that her son is at baseline level of functioning.  No further skilled SLP services are recommended at this time.    Care Partner:  Caregiver Able to Provide Assistance: Yes  Type of Caregiver Assistance: Cognitive  Recommendation:  None     Equipment: None   Reasons for discharge: Treatment goals met;Discharged from hospital   Patient/Family Agrees with Progress Made and Goals Achieved: Yes   Carmelia Roller., [place] 11/09/2015, 9:27 AM

## 2015-11-08 NOTE — Progress Notes (Signed)
Occupational Therapy Session Note  Patient Details  Name: Austin Holmes MRN: [id number] Date of Birth: 14-Jun-1971  Today's Date: 11/08/2015 OT Individual Time: [id number] OT Individual Time Calculation (min): 70 min    Short Term Goals: Week 1:  OT Short Term Goal 1 (Week 1): STG = LTGs due to ELOS  Skilled Therapeutic Interventions/Progress Updates:    Treatment session with focus on functional mobility, ADL retraining, and RUE fine motor control HEP.  Pt bathed and dressed upon arrival, reporting that he showered prior to session.  Discussed self-care goals and progress towards goals based on bathing and dressing today (per pt report).  Completed grooming tasks in standing without assist.  Ambulated to ADL apt and completed tub/shower transfers and toilet transfers without assist.  Returned to therapy gym and educated on fine motor control and strengthening with theraputty.  Provided pt with HEP handout and completed all theraputty exercises and discussed recommendation to complete to continue to address RUE fine motor weakness and decreased control.  Returned to room and made Modified Independent in room.  Therapy Documentation Precautions:  Precautions Precautions: Fall Restrictions Weight Bearing Restrictions: No General:   Vital Signs: Therapy Vitals Temp: 98.1 F (36.7 C) Temp Source: Oral Pulse Rate: 73 Resp: 20 BP: 122/73 mmHg Patient Position (if appropriate): Lying Oxygen Therapy SpO2: 100 % O2 Device: Not Delivered Pain:  Pt with no c/o pain  See Function Navigator for Current Functional Status.   Therapy/Group: Individual Therapy  Simonne Come 11/08/2015, 8:55 AM

## 2015-11-08 NOTE — Patient Care Conference (Signed)
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 Acute systolic congestive heart failure ([place])   . 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 Idiopathic hypotension   . Mass of thigh   . 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 Cardiomyopathy ([place]) 11/03/2015  . Abnormality of gait   . Impulsive   . HLD (hyperlipidemia)   . TIA (transient ischemic attack) 11/02/2015  . Acute CVA (cerebrovascular accident) ([place]) 11/02/2015    Expected Discharge Date: Expected Discharge Date: 11/09/15  Team Members Present: Physician leading conference: Dr. Alysia Penna Social Worker Present: Ovidio Kin, LCSW Nurse Present: Heather Roberts, RN PT Present: Kem Parkinson, PT OT Present: Simonne Come, OT SLP Present: Windell Moulding, SLP PPS Coordinator present : Daiva Nakayama, RN, CRRN     Current Status/Progress Goal Weekly Team Focus  Medical   Abnormality of gait, impulsivity secondary to left medial temporal lobe and left thalamic infarct  Improve mobility, safety  See above   Bowel/Bladder   continent of bowel and bladder  remain continent of bowel and bladder  educate to signs/symptoms of constipation   Swallow/Nutrition/ Hydration     na        ADL's   Mod I overall  Mod I overall  education for HEP and home safety in preparation for d/c   Mobility   mod I gait, transfers, stairs, community mobility  mod I overall   education for HEP and home safety in preparation for d/c.    Communication     na        Safety/Cognition/ Behavioral Observations    no unsafe behaviors        Pain   patient reports no pain  pain less than or equal to 4/10  assess pain q4h, treat as indicated   Skin   no signs of skin injury noted  remain free from skin injury  assess skin q shift    Rehab Goals Patient on target to meet rehab goals: Yes *See Care Plan and progress notes for long and short-term goals.  Barriers to Discharge: Mobility, safety awareness, left thigh mass, ischemic workup    Possible Resolutions to Barriers:  Aggressive therapies, patient and family education, workup left thigh mass and workup ischemia    Discharge Planning/Teaching Needs:  home with family to provide supervision as needed.      Team Discussion:  Reached his goals of mod/i and ready for DC. Pt feels comfortable and ready to go home,no concerns. Match for medication assistance and appointment at clinic in [facility] for medical follow up  Revisions to Treatment Plan:  Ready for DC today   Continued Need for Acute Rehabilitation Level of Care: The patient requires daily medical management by a physician with specialized training in physical medicine and rehabilitation for the following conditions: Daily direction of a multidisciplinary physical rehabilitation program to ensure  safe treatment while eliciting the highest outcome that is of practical value to the patient.: Yes Daily medical management of patient stability for increased activity during participation in an intensive rehabilitation regime.: Yes Daily analysis of laboratory values and/or radiology reports with any subsequent need for medication adjustment of medical intervention for : Cardiac problems;Neurological problems;Other  Elease Hashimoto 11/09/2015, 10:54 AM

## 2015-11-08 NOTE — Progress Notes (Signed)
Physical Therapy Discharge Summary  Patient Details  Name: Austin Holmes MRN: [id number] Date of Birth: 03-08-1971  Today's Date: 11/08/2015 PT Individual Time: 1975-8832 PT Individual Time Calculation (min): 70 min    Patient has met 12 of 12 long term goals due to improved activity tolerance, improved balance, improved postural control, increased strength, ability to compensate for deficits, functional use of  right lower extremity, improved attention, improved awareness and improved coordination.  Patient to discharge at an ambulatory level Modified Independent.   No family present during any PT sessions to provide education, however pt to d/c at modI level for mobility tasks.   Reasons goals not met: All goals met  Recommendation:  Patient does not require ongoing skilled PT services at this time.   Equipment: No equipment provided  Reasons for discharge: treatment goals met and discharge from hospital  Patient/family agrees with progress made and goals achieved: Yes  PT Discharge Precautions/Restrictions Precautions Precautions: Fall Restrictions Weight Bearing Restrictions: No Vital Signs Therapy Vitals Pulse Rate: 76 BP: 129/77 mmHg Pain Pain Assessment Pain Assessment: No/denies pain Vision/Perception  Vision - Assessment Eye Alignment: Within Functional Limits Ocular Range of Motion: Within Functional Limits Alignment/Gaze Preference: Within Defined Limits Tracking/Visual Pursuits: Able to track stimulus in all quads without difficulty Saccades: Within functional limits Perception Comments: WFL  Cognition Orientation Level: Oriented X4 Sensation Sensation Light Touch: Appears Intact Stereognosis: Not tested Hot/Cold: Not tested Proprioception: Appears Intact Coordination Gross Motor Movements are Fluid and Coordinated: Yes Heel Shin Test: WFL BLE 9 Hole Peg Test: Rt: 34 seconds, Lt: 23 seconds. Mild impaired coordination on Rt with difficulty  picking up pegs Motor  Motor Motor - Discharge Observations: mild R hemiparesis  Mobility Bed Mobility Bed Mobility: Supine to Sit;Sit to Supine Supine to Sit: 7: Independent Sit to Supine: 7: Independent Transfers Transfers: Yes Stand Pivot Transfers: 6: Modified independent (Device/Increase time) Locomotion  Ambulation Ambulation: Yes Ambulation/Gait Assistance: 6: Modified independent (Device/Increase time) Ambulation Distance (Feet): 300 Feet Assistive device: None Gait Gait: Yes Gait Pattern: Within Functional Limits Gait velocity: WFL for age/gender norms Stairs / Additional Locomotion Stairs: Yes Stairs Assistance: 6: Modified independent (Device/Increase time) Stair Management Technique: One rail Right;Alternating pattern;Forwards Number of Stairs: 12 Height of Stairs: 6 Ramp: 6: Modified independent (Device) Curb: 6: Modified independent (Device/increase time) Wheelchair Mobility Wheelchair Mobility: No  Trunk/Postural Assessment  Cervical Assessment Cervical Assessment: Within Functional Limits Thoracic Assessment Thoracic Assessment: Within Functional Limits Lumbar Assessment Lumbar Assessment: Within Functional Limits Postural Control Postural Control: Within Functional Limits  Balance Standardized Balance Assessment Standardized Balance Assessment: Berg Balance Test;Dynamic Gait Index;Timed Up and Go Test Berg Balance Test Sit to Stand: Able to stand without using hands and stabilize independently Standing Unsupported: Able to stand safely 2 minutes Sitting with Back Unsupported but Feet Supported on Floor or Stool: Able to sit safely and securely 2 minutes Stand to Sit: Sits safely with minimal use of hands Transfers: Able to transfer safely, minor use of hands Standing Unsupported with Eyes Closed: Able to stand 10 seconds safely Standing Ubsupported with Feet Together: Able to place feet together independently and stand 1 minute safely From  Standing, Reach Forward with Outstretched Arm: Can reach confidently >25 cm (10") From Standing Position, Pick up Object from Floor: Able to pick up shoe safely and easily From Standing Position, Turn to Look Behind Over each Shoulder: Looks behind from both sides and weight shifts well Turn 360 Degrees: Able to turn 360 degrees safely in 4  seconds or less Standing Unsupported, Alternately Place Feet on Step/Stool: Able to stand independently and safely and complete 8 steps in 20 seconds Standing Unsupported, One Foot in Front: Able to place foot tandem independently and hold 30 seconds Standing on One Leg: Able to lift leg independently and hold > 10 seconds Total Score: 56 Dynamic Gait Index Level Surface: Normal Change in Gait Speed: Normal Gait with Horizontal Head Turns: Normal Gait with Vertical Head Turns: Normal Gait and Pivot Turn: Normal Step Over Obstacle: Normal Step Around Obstacles: Normal Steps: Normal Total Score: 24 Timed Up and Go Test TUG: Normal TUG;Manual TUG Normal TUG (seconds): 9 Manual TUG (seconds): 11 Cognitive TUG (seconds): 17 Static Sitting Balance Static Sitting - Balance Support: No upper extremity supported;Feet supported Static Sitting - Level of Assistance: 7: Independent Dynamic Sitting Balance Dynamic Sitting - Balance Support: No upper extremity supported;Feet supported;During functional activity Dynamic Sitting - Level of Assistance: 6: Modified independent (Device/Increase time) Dynamic Sitting - Balance Activities: Lateral lean/weight shifting;Forward lean/weight shifting;Reaching for objects Static Standing Balance Static Standing - Balance Support: No upper extremity supported Static Standing - Level of Assistance: 6: Modified independent (Device/Increase time) Static Stance: Eyes closed Static Stance: Eyes Closed: x30 sec with mod I Dynamic Standing Balance Dynamic Standing - Balance Support: No upper extremity supported;During  functional activity Dynamic Standing - Level of Assistance: 6: Modified independent (Device/Increase time) Dynamic Standing - Balance Activities: Lateral lean/weight shifting;Forward lean/weight shifting;Reaching for weighted objects;Reaching across midline Extremity Assessment  RUE Assessment RUE Assessment: Within Functional Limits LUE Assessment LUE Assessment: Within Functional Limits RLE Assessment RLE Assessment: Within Functional Limits LLE Assessment LLE Assessment: Within Functional Limits  Skilled Therapeutic Intervention: Pt received seated in recliner, denies pain and agreeable to treatment. Assessed all mobility with mod I as described above, including car transfer, floor transfer, bed mobility, gait in controlled/home/community ambulation and stairs with mod I/Ind . Nustep x10 min with BUE/BLE on level 7 for strengthening and endurance. Educated pt regarding safety at home, maintaining active lifestyle including stretches and exercises as instructed. Pt agreeable to all the above with no further questions/concerns. Remained up in room at completion of session, mod I in room at this time.   See Function Navigator for Current Functional Status.  Benjiman Core Tygielski 11/08/2015, 5:15 PM

## 2015-11-08 NOTE — Discharge Summary (Signed)
Physician Discharge Summary  Patient ID: KATRIEL HAILES MRN: [id number] DOB/AGE: 04-02-1971 45 y.o.  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   Significant Diagnostic Studies: Mr Femur Left W Wo Contrast  11/06/2015  CLINICAL DATA:  Left lower extremity, medial left thigh mass EXAM: MR OF THE LEFT LOWER EXTREMITY WITHOUT AND WITH CONTRAST TECHNIQUE: Multiplanar, multisequence MR imaging of the lower left extremity was performed both before and after administration of intravenous contrast. CONTRAST:  21mL MULTIHANCE GADOBENATE DIMEGLUMINE 529 MG/ML IV SOLN COMPARISON:  None. FINDINGS: No acute fracture, dislocation or avascular necrosis. No periosteal reaction or bone destruction. Mild linear T2 hyperintense signal in the right femoral neck likely reflecting red marrow. Well-circumscribed 7.1 x 7.3 x 9.2 cm nonenhancing mass just posterior to the left sartorius muscle adjacent to the neurovascular bundle. The mass is predominantly T2 hyperintense with interspersed areas of T1 hyperintensity consistent with fat. No other soft tissue mass. Muscles are normal in signal. No muscle edema or atrophy. No other fluid collection or hematoma. IMPRESSION: 1. Well-circumscribed 7.1 x 7.3 x 9.2 cm nonenhancing mass just posterior to the left sartorius muscle adjacent to the neurovascular bundle. The mass is predominantly T2 hyperintense with interspersed areas of T1 hyperintensity consistent with fat. 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 Electronically Signed   By: Kathreen Devoid   On: 11/06/2015 10:57    Labs:  Basic Metabolic Panel:  Recent Labs Lab 11/04/15 0514  NA 139  K 3.8  CL 103   CO2 25  GLUCOSE 93  BUN 10  CREATININE 1.23  CALCIUM 9.5    CBC:  Recent Labs Lab 11/04/15 0514  WBC 3.5*  NEUTROABS 1.6*  HGB 13.9  HCT 42.2  MCV 85.1  PLT 142*    CBG: No results for input(s): GLUCAP in the last 168 hours.  Brief HPI:   Benjy Sandles is a 45 y.o. male in relatively good health who was admitted on to [facility] on 11/01/2015 with HA, right sided weakness and progressive difficulty walking. 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 2D echo with severe reduction in systolic funciton with diffuse hypokinesis and EF 20-25% with trivial AVR. 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 Dr. Tyron Russell evaluated patient and recommended full strength ASA for stroke prevention and hypercoagulopathy work up. He continued to be limited by right sided weakness with sensory deficits. CIR was recommended for follow up therapy.     Hospital Course: FINIS HOWARD was admitted to rehab 11/03/2015 for inpatient therapies to consist of PT, ST and OT at least three hours five days a week. Past admission physiatrist, therapy team and rehab RN have worked together to provide customized collaborative inpatient rehab. He was maintained on ASA and has tolerated this without side effects. Cardiology was consulted for work up of CM and recommended cardiac cath in next 4-6 weeks to rule out CAD.  Dr. Leonie Man was contacted for input and recommended TEE for full work up.  [facility] to scheduling issues TEE has been set up  for 04/13.  Blood pressures were monitored on bid basis and have been well controlled. Cozaar was added to prior to discharge.    He has had complaints of new onset of right thigh edema and BLE dopplers were negative for DVT. He has had large mass left inguinal area for the past year with  recommendations for work up with MRI. This was done on 4/7 and showed 7.1 X 7.3 X 9.2 cm well circumscribed mass most concerning for liposarcoma. Dr. Jana Hakim was consulted for input and recommended biopsy as well as staging studies. These are to be set up on outpatient basis and he is to follow up with Hem/Onc in next 1-2 weeks after discharge. Patient has made good gains during his rehab stay and is modified independent without AD. He does require supervision with higher level cognitive tasks which is currently at baseline per mother.     Rehab course: During patient's stay in rehab brief team conference was held to discuss patient's progress, set goals and discuss barriers to discharge. At admission, patient required supervision with  Mobility and min assist with basic self-care tasks. He had mild to moderate cognitive deficits affecting functional problem solving, attention as well as memory. He has had improvement in activity tolerance, balance, postural control, as well as ability to compensate for deficits. He is has had improvement in functional use RUE  and RLE as well as improved awareness He is able to complete ADL tasks independently.  He is able to ambulate 300 feet and climb 12 stairs at modified independent level.  He has been educated on memory strategies and requires more than reasonable time for memory and problem solving of complex tasks. Family education was completed with mother who will assist with tasks as well as medication management after discharge.  He has been educated on HEP.     Disposition:  Home  Diet: Heart Healthy. Low salt.  Special Instructions: 1. No driving. No strenuous activity.        Discharge Instructions    Ambulatory referral to Physical Medicine Rehab    Complete by:  As directed   1-2 week follow up post stroke/moderate complexity            Medication List    TAKE these medications        aspirin 325 MG EC tablet  Take 1 tablet (325 mg  total) by mouth daily.     atorvastatin 40 MG tablet  Commonly known as:  LIPITOR  Take 1 tablet (40 mg total) by mouth daily at 6 PM.     losartan 25 MG tablet  Commonly known as:  COZAAR  Take 1 tablet (25 mg total) by mouth daily.     metoprolol succinate 25 MG 24 hr tablet  Commonly known as:  TOPROL XL  Take 1 tablet (25 mg total) by mouth daily.       Follow-up Information    Follow up with Richardson Dopp, PA-C. Go on 11/26/2015.   Specialties:  Physician Assistant, Radiology, Interventional Cardiology   Why:  @9 :50 am for post hosptial cardiology f/u   Contact information:   [id number] [address] [phone]       Follow up with [facility] On 11/11/2015.   Why:  9:00AM for Transesophageal echo with Dr. Bronson Ing. Please arrive by 7:30AM (1.5 hrs early), no food or drink for 6 hours prior to procedure.    Contact information:  [id number] [place]      Follow up with Charlett Blake, MD.   Specialty:  Physical Medicine and Rehabilitation   Why:  office will call you with follow up appointment   Contact information:   [place] [address] [phone]       Follow up with Chauncey Cruel, MD. Call today.   Specialty:  Oncology   Why:  for follow up appointment to work up thight mass.    Contact information:   [place] [address] [phone]       Follow up with SETHI,PRAMOD, MD. Call today.   Specialties:  Neurology, Radiology   Why:  for follow up appointment in 4 weeks.    Contact information:   [address] [place] [address] [phone]       Follow up with Golden Pop, MD On 01/05/2016.   Specialty:  Family Medicine   Why:  appointment at 3 pm.    Contact information:   [address] [place] [address] [phone]       Signed: Bary Leriche 11/09/2015, 4:57 PM

## 2015-11-08 NOTE — Consult Note (Signed)
[place]  [phone] [phone]     ID: Austin Holmes DOB: 1971/03/19  Austin#: [id number]  JOI#:[id number]  Patient Care Team: Jettie Booze, MD as Consulting Physician (Cardiology) Chauncey Cruel, MD as Consulting Physician (Oncology) Charlett Blake, MD as Consulting Physician (Physical Medicine and Rehabilitation) PCP: No primary care provider on file. SU:  OTHER MD: Frederich Cha MD  CHIEF COMPLAINT: Left thigh mass  CURRENT TREATMENT: Awaiting definitive diagnosis   HISTORY OF PRESENT ILLNESS:  Austin Holmes presented to the emergency room 11/01/2015 complaining of numbness in his right leg. He had been having pain and stiffness and described this as a new problem. A noncontrast head CT was obtained which showed no acute abnormality. The patient was going to be discharged on baby aspirin when he was found to be ataxic. He was felt to be possibly dehydrated so he received intravenous fluids but over this period of time his condition became worse, with progressive right body weakness. Accordingly a CT angiography of the head and neck was obtained for 10/18/2015. There was no great vessel atherosclerosis, unremarkable vertebral arteries, and generally unremarkable posterior circulation except that the posterior cerebral artery on the left was very small and there was a 6 mm occlusion in the distal left P2 segment. There was reconstituted flow distally. The anterior circulation was patent. There was no evidence of a bleed.  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  The patient was referred to the stroke team, and after careful evaluation it was felt his stroke symptoms have been present in excess of 6 hours and so TPA was not given. He was admitted to the stroke team and  given his right hemiparesis transferred to rehabilitation on 11/03/2015.  As part of his workup an echocardiogram was obtained 11/03/2015. This showed an ejection fraction in the 20-25% range, with diffuse hypokinesis, which was unexpected and surprising since the patient has a very physically demanding job and had been able to work as recently as 10/30/2015. Electrocardiogram showed a left bundle-branch block an sinus rhythm. 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  In the left thigh there was a large palpable mass. This was evaluated at [facility] with an [place] 10/06/2014 which describes a complex left thigh mass measuring 8.7 x 4.9 x 6.3 cm. It is not clear what follow-up was suggested as records we have are incomplete. This admission Doppler ultrasonography of the right lower extremity 11/04/2015 showed no evidence of a clot, but there was a mass in the left upper thigh area measuring up to 7.9 cm which appeared to be a fatty tumor. MRI of the left femur with and without contrast obtained 11/05/2015 confirmed a well-circumscribed 9.2 x 7.1 x 7.3 cm nonenhancing mass posterior to the left sartorius muscle adjacent to the neurovascular bundle. There was no muscle edema or atrophy. 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  The patient's subsequent history is as detailed below   INTERVAL HISTORY: I met with the patient and his mother in his hospital room 11/08/2015  REVIEW OF SYSTEMS: He has made a remarkable recovery as far as his stroke is concerned, and currently ambulates normally  and "feels normal again." He denies h/a, N/V, visual changes, dizzyness or gait imbalance. He denies cough, SOB or pleurisy. No change in bowel or bladder habits. No weight loss, unexplained faigue or unexplained weight loss. A detailed ROS was otherwise  noncontributory.  PAST MEDICAL HISTORY: No past medical history on file.  PAST SURGICAL HISTORY: Past Surgical History  Procedure Laterality Date  . None      FAMILY HISTORY Family History  Problem Relation Age of Onset  . Diabetes Mellitus II Maternal Grandmother   . Diabetes Mellitus II Maternal Grandfather   . Cancer Father     pancreatic  The patient's father died from pancreatic cancer age 59. The patient's mother is 80 as of April 2017. She is a "3 time breast cancer survivor," first diagnosed with breast cancer age 46, followed at [facility]. The patient has one 1/2 sister-- he is not in contact with her.  SOCIAL HISTORY:  Austin Holmes is not married and lives with his mother Austin Holmes. 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 He routinely carries 5-70 lb boxes around in his job. He has one daughter, Austin Holmes, who lives in [place] and works in [employer].    ADVANCED DIRECTIVES: not in place. He intends to name his mother as Austin Holmes: Social History  Substance Use Topics  . Smoking status: Never Smoker   . Smokeless tobacco: Not on file  . Alcohol Use: 0.0 oz/week    0 Standard drinks or equivalent per week     Comment: occasionally drinks beer     Colonoscopy:  PSA:  Bone density:  Lipid panel:  No Known Allergies  Current Facility-Administered Medications  Medication Dose Route Frequency Provider Last Rate Last Dose  . acetaminophen (TYLENOL) tablet 325-650 mg  325-650 mg Oral Q4H PRN Bary Leriche, PA-C      . alum & mag hydroxide-simeth (MAALOX/MYLANTA) 200-200-20 MG/5ML suspension 30 mL  30 mL Oral Q4H PRN Bary Leriche, PA-C      . aspirin EC tablet 325 mg  325 mg Oral Daily Bary Leriche, PA-C   325 mg at 11/08/15 0908  . atorvastatin (LIPITOR) tablet 40 mg  40 mg Oral q1800 Ivan Anchors Love, PA-C   40 mg at 11/07/15 1741  . bisacodyl (DULCOLAX) suppository 10 mg  10 mg Rectal Daily PRN Bary Leriche, PA-C      . diphenhydrAMINE (BENADRYL) 12.5 MG/5ML elixir 12.5-25 mg  12.5-25 mg Oral Q6H PRN Ivan Anchors Love, PA-C      . enoxaparin (LOVENOX) injection 40 mg  40 mg Subcutaneous Q24H Charlett Blake, MD   40 mg at 11/07/15 2142  . guaiFENesin-dextromethorphan (ROBITUSSIN DM) 100-10 MG/5ML syrup 5-10 mL  5-10 mL Oral Q6H PRN Bary Leriche, PA-C      . losartan (COZAAR) tablet 25 mg  25 mg Oral Daily Almyra Deforest, [place]      . metoprolol succinate (TOPROL-XL) 24 hr tablet 25 mg  25 mg Oral Daily Bary Leriche, PA-C   25 mg at 11/08/15 0908  . prochlorperazine (COMPAZINE) tablet 5-10 mg  5-10 mg Oral Q6H PRN Bary Leriche, PA-C       Or  . prochlorperazine (COMPAZINE) injection 5-10 mg  5-10 mg Intramuscular Q6H PRN Bary Leriche, PA-C       Or  . prochlorperazine (COMPAZINE) suppository 12.5 mg  12.5 mg Rectal  Q6H PRN Bary Leriche, PA-C      . senna-docusate (Senokot-S) tablet 2 tablet  2 tablet Oral QHS PRN Bary Leriche, PA-C      . sodium phosphate (FLEET) 7-19 GM/118ML enema 1 enema  1 enema Rectal Once PRN Pamela S Love, PA-C      . traZODone (DESYREL) tablet 25-50 mg  25-50 mg Oral QHS PRN Bary Leriche, PA-C        OBJECTIVE: middle aged [place] American man who appears well Filed Vitals:   11/08/15 0908 11/08/15 1339  BP: 129/77 118/66  Pulse: 76 72  Temp:  98.7 F (37.1 C)  Resp:  19     Body mass index is 29.11 kg/(m^2).    ECOG FS:0 - Asymptomatic  Sclerae unicteric, EOMs intact Ear-nose-throat: Oropharynx clear and moist Lymphatic: No cervical or supraclavicular adenopathy Lungs no rales or rhonchi, good excursion bilaterally Heart regular rate and rhythm, no murmur appreciated Abd muscular, nontender, positive bowel sounds MSK no focal spinal tenderness, no joint edema Neuro: entirely non-focal, motor 5/5 throughout, well-oriented, appropriate affect   LAB RESULTS:  CMP     Component Value Date/Time   NA 139 11/04/2015 0514   K 3.8 11/04/2015 0514   CL 103  11/04/2015 0514   CO2 25 11/04/2015 0514   GLUCOSE 93 11/04/2015 0514   BUN 10 11/04/2015 0514   CREATININE 1.23 11/04/2015 0514   CALCIUM 9.5 11/04/2015 0514   PROT 7.1 11/04/2015 0514   ALBUMIN 3.6 11/04/2015 0514   AST 40 11/04/2015 0514   ALT 42 11/04/2015 0514   ALKPHOS 32* 11/04/2015 0514   BILITOT 1.0 11/04/2015 0514   GFRNONAA >60 11/04/2015 0514   GFRAA >60 11/04/2015 0514    INo results found for: SPEP, UPEP  Lab Results  Component Value Date   WBC 3.5* 11/04/2015   NEUTROABS 1.6* 11/04/2015   HGB 13.9 11/04/2015   HCT 42.2 11/04/2015   MCV 85.1 11/04/2015   PLT 142* 11/04/2015    '@LASTCHEMISTRY' @  No results found for: LABCA2  No components found for: LABCA125  No results for input(s): INR in the last 168 hours.  Urinalysis No results found for: COLORURINE, APPEARANCEUR, LABSPEC, PHURINE, GLUCOSEU, HGBUR, BILIRUBINUR, KETONESUR, PROTEINUR, UROBILINOGEN, NITRITE, LEUKOCYTESUR    ELIGIBLE FOR AVAILABLE RESEARCH PROTOCOL: no  STUDIES: Ct Angio Head W/cm &/or Wo Cm  11/01/2015  ADDENDUM REPORT: 11/01/2015 20:36 ADDENDUM: Study discussed by telephone with Dr. Doren Custard STAFFORD on 11/01/2015 at 2030 hours. Electronically Signed   By: Genevie Ann M.D.   On: 11/01/2015 20:36  11/01/2015  CLINICAL DATA:  45 year old male with unsteady gait today, blurred vision, right lower extremity paresthesia. Initial encounter. EXAM: CT ANGIOGRAPHY HEAD AND NECK TECHNIQUE: Multidetector CT imaging of the head and neck was performed using the standard protocol during bolus administration of intravenous contrast. Multiplanar CT image reconstructions and MIPs were obtained to evaluate the vascular anatomy. Carotid stenosis measurements (when applicable) are obtained utilizing NASCET criteria, using the distal internal carotid diameter as the denominator. CONTRAST:  75 mL Isovue 370 COMPARISON:  Head CT without contrast 1636 hours today. FINDINGS: CTA NECK Skeleton:  No acute osseous abnormality  identified. Mild paranasal sinus mucosal thickening. Sphenoid sinuses are spared. Tympanic cavities and mastoids are clear. Other neck: Negative lung apices. No superior mediastinal lymphadenopathy. Thyroid, larynx, pharynx, parapharyngeal spaces, retropharyngeal space, sublingual space, submandibular glands, and parotid glands are within normal limits. No cervical lymphadenopathy. Aortic arch: 3 vessel arch configuration. No arch atherosclerosis  or great vessel origin stenosis. Right carotid system: Negative. Left carotid system: Negative. Vertebral arteries:Negative; no proximal subclavian artery stenosis, normal vertebral artery origins and no vertebral artery stenosis in the neck. The left vertebral artery is non dominant. CTA HEAD Posterior circulation: Mildly dominant distal right vertebral artery. Normal PICA origins. No distal vertebral artery stenosis. Patent vertebrobasilar junction. No basilar stenosis. Normal SCA and PCA origins. Both posterior communicating arteries are present, the left is diminutive. There is a 6 mm segment of occlusion in the distal left PCA P2 segment. There is reconstituted flow distally. This is best seen on series 11, image 21. Right PCA branches are within normal limits. Anterior circulation: Both ICA siphons are patent. Both ophthalmic and posterior communicating artery origins are within normal limits. Both carotid termini are patent. The left ACA is non dominant. Anterior communicating artery and ACA branches are within normal limits. Left MCA M1 segment, bifurcation, and left MCA branches are within normal limits. Right MCA M1 segment, bifurcation, and right MCA branches are within normal limits. Venous sinuses: Patent. Anatomic variants: Non dominant left vertebral artery and left ACA. Delayed phase: No abnormal enhancement identified. No cortically based acute infarct identified. No intracranial hemorrhage or mass effect. IMPRESSION: 1. Negative for emergent large vessel  occlusion but Positive for segmental Occlusion vs. High-grade Stenosis in the Left PCA P2 segment. There is reconstituted distal left PCA flow. See series 11, image 21. 2. Stable and negative CT appearance of the brain. 3. No atherosclerosis or stenosis in the neck. Negative anterior circulation. Electronically Signed: By: Genevie Ann M.D. On: 11/01/2015 20:27   Ct Head Wo Contrast  11/01/2015  CLINICAL DATA:  Right leg numbness since yesterday EXAM: CT HEAD WITHOUT CONTRAST TECHNIQUE: Contiguous axial images were obtained from the base of the skull through the vertex without intravenous contrast. COMPARISON:  None. FINDINGS: No acute intracranial abnormality. Specifically, no hemorrhage, hydrocephalus, mass lesion, acute infarction, or significant intracranial injury. No acute calvarial abnormality. Visualized paranasal sinuses and mastoids clear. Orbital soft tissues unremarkable. IMPRESSION: No acute intracranial abnormality. Electronically Signed   By: Rolm Baptise M.D.   On: 11/01/2015 16:40   Ct Angio Neck W/cm &/or Wo/cm  11/01/2015  ADDENDUM REPORT: 11/01/2015 20:36 ADDENDUM: Study discussed by telephone with Dr. Doren Custard STAFFORD on 11/01/2015 at 2030 hours. Electronically Signed   By: Genevie Ann M.D.   On: 11/01/2015 20:36  11/01/2015  CLINICAL DATA:  45 year old male with unsteady gait today, blurred vision, right lower extremity paresthesia. Initial encounter. EXAM: CT ANGIOGRAPHY HEAD AND NECK TECHNIQUE: Multidetector CT imaging of the head and neck was performed using the standard protocol during bolus administration of intravenous contrast. Multiplanar CT image reconstructions and MIPs were obtained to evaluate the vascular anatomy. Carotid stenosis measurements (when applicable) are obtained utilizing NASCET criteria, using the distal internal carotid diameter as the denominator. CONTRAST:  75 mL Isovue 370 COMPARISON:  Head CT without contrast 1636 hours today. FINDINGS: CTA NECK Skeleton:  No acute  osseous abnormality identified. Mild paranasal sinus mucosal thickening. Sphenoid sinuses are spared. Tympanic cavities and mastoids are clear. Other neck: Negative lung apices. No superior mediastinal lymphadenopathy. Thyroid, larynx, pharynx, parapharyngeal spaces, retropharyngeal space, sublingual space, submandibular glands, and parotid glands are within normal limits. No cervical lymphadenopathy. Aortic arch: 3 vessel arch configuration. No arch atherosclerosis or great vessel origin stenosis. Right carotid system: Negative. Left carotid system: Negative. Vertebral arteries:Negative; no proximal subclavian artery stenosis, normal vertebral artery origins and no vertebral artery stenosis in  the neck. The left vertebral artery is non dominant. CTA HEAD Posterior circulation: Mildly dominant distal right vertebral artery. Normal PICA origins. No distal vertebral artery stenosis. Patent vertebrobasilar junction. No basilar stenosis. Normal SCA and PCA origins. Both posterior communicating arteries are present, the left is diminutive. There is a 6 mm segment of occlusion in the distal left PCA P2 segment. There is reconstituted flow distally. This is best seen on series 11, image 21. Right PCA branches are within normal limits. Anterior circulation: Both ICA siphons are patent. Both ophthalmic and posterior communicating artery origins are within normal limits. Both carotid termini are patent. The left ACA is non dominant. Anterior communicating artery and ACA branches are within normal limits. Left MCA M1 segment, bifurcation, and left MCA branches are within normal limits. Right MCA M1 segment, bifurcation, and right MCA branches are within normal limits. Venous sinuses: Patent. Anatomic variants: Non dominant left vertebral artery and left ACA. Delayed phase: No abnormal enhancement identified. No cortically based acute infarct identified. No intracranial hemorrhage or mass effect. IMPRESSION: 1. Negative for  emergent large vessel occlusion but Positive for segmental Occlusion vs. High-grade Stenosis in the Left PCA P2 segment. There is reconstituted distal left PCA flow. See series 11, image 21. 2. Stable and negative CT appearance of the brain. 3. No atherosclerosis or stenosis in the neck. Negative anterior circulation. Electronically Signed: By: Genevie Ann M.D. On: 11/01/2015 20:27   [place] Brain Wo Contrast  11/02/2015  CLINICAL DATA:  45 year old male with un steady gait, blurred vision right lower extremity paresthesias. Subsequent encounter. EXAM: MRI HEAD WITHOUT CONTRAST MRA HEAD WITHOUT CONTRAST TECHNIQUE: Multiplanar, multiecho pulse sequences of the brain and surrounding structures were obtained without intravenous contrast. Angiographic images of the head were obtained using MRA technique without contrast. COMPARISON:  CT angiogram 11/01/2015. 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 No intracranial hemorrhage. Very mild periventricular white matter changes may be related to result of small vessel disease. No age advanced atrophy or hydrocephalus. No intracranial mass lesion noted on this unenhanced exam. Decreased signal intensity of bone marrow (most notable upper cervical spine) may be related to patient's habitus. Correlation with CBC to exclude anemia contributing to this appearance may be considered Cervical medullary junction, pituitary region, pineal region and orbital structures unremarkable. Junction Mild paranasal sinus mucosal thickening. MRA HEAD FINDINGS Narrowing of the cavernous segment of the internal carotid artery greater on left. This is felt to represent overestimation of degree of narrowing given the appearance on recent CT angiogram. Mild irregularity M1 segment middle cerebral artery bilaterally. Minimal bulge superior margin felt to be origin of a vessel rather than aneurysm. Moderate narrowing M2 segment /middle cerebral  artery branches bilaterally. Mild to moderate narrowing A1 segment left anterior cerebral artery. Moderate to marked narrowing A2 segment left anterior cerebral artery. Mild irregularity with minimal to mild narrowing portions of right anterior cerebral artery. Moderate narrowing distal vertebral arteries more notable on the left. Nonvisualized posterior inferior cerebellar arteries and anterior inferior cerebellar arteries bilaterally. Mild to slightly moderate narrowing and irregularity basilar artery. 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 Mild to moderate narrowing of portions of the right posterior cerebral artery. Moderate narrowing superior cerebellar artery bilaterally. 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 Decreased signal intensity of bone marrow (most notable upper cervical spine) may be related to patient's habitus. Correlation with CBC to exclude anemia  contributing to this appearance may be considered MRA HEAD Austin angiogram appears to overestimate degree of narrowing given the appearance on recent CT angiogram as detailed above. 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 Electronically Signed   By: Genia Del M.D.   On: 11/02/2015 11:54   [place] Femur Left W Wo Contrast  11/06/2015  CLINICAL DATA:  Left lower extremity, medial left thigh mass EXAM: [place] OF THE LEFT LOWER EXTREMITY WITHOUT AND WITH CONTRAST TECHNIQUE: Multiplanar, multisequence Austin imaging of the lower left extremity was performed both before and after administration of intravenous contrast. CONTRAST:  38m MULTIHANCE GADOBENATE DIMEGLUMINE 529 MG/ML IV SOLN COMPARISON:  None. FINDINGS: No acute fracture, dislocation or avascular necrosis. No periosteal reaction or bone destruction. Mild linear T2 hyperintense signal in the right femoral neck likely reflecting red marrow. Well-circumscribed 7.1 x 7.3 x 9.2 cm nonenhancing  mass just posterior to the left sartorius muscle adjacent to the neurovascular bundle. The mass is predominantly T2 hyperintense with interspersed areas of T1 hyperintensity consistent with fat. No other soft tissue mass. Muscles are normal in signal. No muscle edema or atrophy. No other fluid collection or hematoma. IMPRESSION: 1. Well-circumscribed 7.1 x 7.3 x 9.2 cm nonenhancing mass just posterior to the left sartorius muscle adjacent to the neurovascular bundle. The mass is predominantly T2 hyperintense with interspersed areas of T1 hyperintensity consistent with fat. 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 Electronically Signed   By: HKathreen Devoid  On: 11/06/2015 10:57   Austin MJodene NamHead/brain Wo Cm  11/02/2015  CLINICAL DATA:  45year old male with un steady gait, blurred vision right lower extremity paresthesias. Subsequent encounter. EXAM: MRI HEAD WITHOUT CONTRAST MRA HEAD WITHOUT CONTRAST TECHNIQUE: Multiplanar, multiecho pulse sequences of the brain and surrounding structures were obtained without intravenous contrast. Angiographic images of the head were obtained using MRA technique without contrast. COMPARISON:  CT angiogram 11/01/2015. 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 No intracranial hemorrhage. Very mild periventricular white matter changes may be related to result of small vessel disease. No age advanced atrophy or hydrocephalus. No intracranial mass lesion noted on this unenhanced exam. Decreased signal intensity of bone marrow (most notable upper cervical spine) may be related to patient's habitus. Correlation with CBC to exclude anemia contributing to this appearance may be considered Cervical medullary junction, pituitary region, pineal region and orbital structures unremarkable. Junction Mild paranasal sinus mucosal thickening. MRA HEAD FINDINGS Narrowing of the cavernous segment of the internal carotid artery  greater on left. This is felt to represent overestimation of degree of narrowing given the appearance on recent CT angiogram. Mild irregularity M1 segment middle cerebral artery bilaterally. Minimal bulge superior margin felt to be origin of a vessel rather than aneurysm. Moderate narrowing M2 segment /middle cerebral artery branches bilaterally. Mild to moderate narrowing A1 segment left anterior cerebral artery. Moderate to marked narrowing A2 segment left anterior cerebral artery. Mild irregularity with minimal to mild narrowing portions of right anterior cerebral artery. Moderate narrowing distal vertebral arteries more notable on the left. Nonvisualized posterior inferior cerebellar arteries and anterior inferior cerebellar arteries bilaterally. Mild to slightly moderate narrowing and irregularity basilar artery. 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 Mild to moderate narrowing of portions of the right posterior cerebral artery. Moderate narrowing superior cerebellar artery bilaterally. 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 Decreased signal intensity of bone  marrow (most notable upper cervical spine) may be related to patient's habitus. Correlation with CBC to exclude anemia contributing to this appearance may be considered MRA HEAD [place] angiogram appears to overestimate degree of narrowing given the appearance on recent CT angiogram as detailed above. 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 Electronically Signed   By: Genia Del M.D.   On: 11/02/2015 11:54    ASSESSMENT: 45 y.o. 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 They appreciated that it did not belong there, and that it did not seem to be invading the surrounding muscles and other structures. We then had a long orientating discussion regarding what cancer is, the major groups, and how cancer is diagnosed.  Austin Holmes understand we are concerned he mad have a sarcoma in his left thigh. If the measurements from March 2016 [place] at [facility] are accurate, it has grown slowly but measurably in the last year. We need to biopsy this mass, and we need to obtain staging studies to make sure if this is cancer it has not spread to other parts of his body.  Since he has made such a remarkable recovery he tells me the plan is for discharge tomorrow. Accordingly I will set him up with scans and biopsy as outpatient. He will return to see me and Dr Rolanda Jay a week or so later to discuss the final diagnosis and definitive treatment plan.  Incidentally, it is difficult to understand the echo showing severe global hypokinesis in this patient who routinely lifts 50-70 lb boxes all day at work, and has been working full time as recently as within the past week. Further evaluation per cardiology is pending.  At the next visit I will give the patient advanced directives forms to complete. At this point he should be treated as a full code.   Chauncey Cruel, MD   11/08/2015 1:50 PM Medical Oncology and Hematology [facility] [address] [place], [place] [address] Tel. [phone]    Fax. [phone]

## 2015-11-08 NOTE — Progress Notes (Signed)
Patient Name: Austin Holmes Date of Encounter: 11/08/2015   SUBJECTIVE  Feeling well. No chest pain, sob or palpitations.   CURRENT MEDS . aspirin EC  325 mg Oral Daily  . atorvastatin  40 mg Oral q1800  . enoxaparin (LOVENOX) injection  40 mg Subcutaneous Q24H  . metoprolol succinate  25 mg Oral Daily    OBJECTIVE  Filed Vitals:   11/07/15 0851 11/07/15 1350 11/08/15 0521 11/08/15 0908  BP: 120/78 116/80 122/73 129/77  Pulse: 89 69 73 76  Temp:  98 F (36.7 C) 98.1 F (36.7 C)   TempSrc:  Oral Oral   Resp:  18 20   Height:      Weight:      SpO2:  98% 100%     Intake/Output Summary (Last 24 hours) at 11/08/15 1213 Last data filed at 11/08/15 0800  Gross per 24 hour  Intake    720 ml  Output      0 ml  Net    720 ml   Filed Weights   11/03/15 1615  Weight: 232 lb 14.4 oz (105.643 kg)    PHYSICAL EXAM  General: Well developed, well nourished, male in no acute distress Head: Eyes PERRLA, No xanthomas. Normocephalic and atraumatic, oropharynx without edema or exudate.  Lungs: Resp regular and unlabored, CTA. Heart: RRR no s3, s4, or murmurs..  Neck: No carotid bruits. No lymphadenopathy. No JVD. Abdomen: Bowel sounds present, abdomen soft and non-tender without masses or hernias noted. Msk: No spine or cva tenderness. No weakness, no joint deformities or effusions. Extremities: No clubbing, cyanosis or edema. DP/PT/Radials 2+ and equal bilaterally. L thigh mobil, rubbery knot. R thigh textured knot. Neuro: Alert and oriented X 3. No focal deficits noted. Psych: Good affect, responds appropriately Skin: No rashes or lesions noted.   Radiology/Studies  Ct Angio Head W/cm &/or Wo Cm  11/01/2015  ADDENDUM REPORT: 11/01/2015 20:36 ADDENDUM: Study discussed by telephone with Dr. Doren Custard STAFFORD on 11/01/2015 at 2030 hours. Electronically Signed   By: Genevie Ann M.D.   On: 11/01/2015 20:36  11/01/2015  CLINICAL DATA:  45 year old male with unsteady gait  today, blurred vision, right lower extremity paresthesia. Initial encounter. EXAM: CT ANGIOGRAPHY HEAD AND NECK TECHNIQUE: Multidetector CT imaging of the head and neck was performed using the standard protocol during bolus administration of intravenous contrast. Multiplanar CT image reconstructions and MIPs were obtained to evaluate the vascular anatomy. Carotid stenosis measurements (when applicable) are obtained utilizing NASCET criteria, using the distal internal carotid diameter as the denominator. CONTRAST:  75 mL Isovue 370 COMPARISON:  Head CT without contrast 1636 hours today. FINDINGS: CTA NECK Skeleton:  No acute osseous abnormality identified. Mild paranasal sinus mucosal thickening. Sphenoid sinuses are spared. Tympanic cavities and mastoids are clear. Other neck: Negative lung apices. No superior mediastinal lymphadenopathy. Thyroid, larynx, pharynx, parapharyngeal spaces, retropharyngeal space, sublingual space, submandibular glands, and parotid glands are within normal limits. No cervical lymphadenopathy. Aortic arch: 3 vessel arch configuration. No arch atherosclerosis or great vessel origin stenosis. Right carotid system: Negative. Left carotid system: Negative. Vertebral arteries:Negative; no proximal subclavian artery stenosis, normal vertebral artery origins and no vertebral artery stenosis in the neck. The left vertebral artery is non dominant. CTA HEAD Posterior circulation: Mildly dominant distal right vertebral artery. Normal PICA origins. No distal vertebral artery stenosis. Patent vertebrobasilar junction. No basilar stenosis. Normal SCA and PCA origins. Both posterior communicating arteries are present, the left is diminutive. There is a 6 mm  segment of occlusion in the distal left PCA P2 segment. There is reconstituted flow distally. This is best seen on series 11, image 21. Right PCA branches are within normal limits. Anterior circulation: Both ICA siphons are patent. Both ophthalmic  and posterior communicating artery origins are within normal limits. Both carotid termini are patent. The left ACA is non dominant. Anterior communicating artery and ACA branches are within normal limits. Left MCA M1 segment, bifurcation, and left MCA branches are within normal limits. Right MCA M1 segment, bifurcation, and right MCA branches are within normal limits. Venous sinuses: Patent. Anatomic variants: Non dominant left vertebral artery and left ACA. Delayed phase: No abnormal enhancement identified. No cortically based acute infarct identified. No intracranial hemorrhage or mass effect. IMPRESSION: 1. Negative for emergent large vessel occlusion but Positive for segmental Occlusion vs. High-grade Stenosis in the Left PCA P2 segment. There is reconstituted distal left PCA flow. See series 11, image 21. 2. Stable and negative CT appearance of the brain. 3. No atherosclerosis or stenosis in the neck. Negative anterior circulation. Electronically Signed: By: Genevie Ann M.D. On: 11/01/2015 20:27   Ct Head Wo Contrast  11/01/2015  CLINICAL DATA:  Right leg numbness since yesterday EXAM: CT HEAD WITHOUT CONTRAST TECHNIQUE: Contiguous axial images were obtained from the base of the skull through the vertex without intravenous contrast. COMPARISON:  None. FINDINGS: No acute intracranial abnormality. Specifically, no hemorrhage, hydrocephalus, mass lesion, acute infarction, or significant intracranial injury. No acute calvarial abnormality. Visualized paranasal sinuses and mastoids clear. Orbital soft tissues unremarkable. IMPRESSION: No acute intracranial abnormality. Electronically Signed   By: Rolm Baptise M.D.   On: 11/01/2015 16:40   Ct Angio Neck W/cm &/or Wo/cm  11/01/2015  ADDENDUM REPORT: 11/01/2015 20:36 ADDENDUM: Study discussed by telephone with Dr. Doren Custard STAFFORD on 11/01/2015 at 2030 hours. Electronically Signed   By: Genevie Ann M.D.   On: 11/01/2015 20:36  11/01/2015  CLINICAL DATA:  45 year old male  with unsteady gait today, blurred vision, right lower extremity paresthesia. Initial encounter. EXAM: CT ANGIOGRAPHY HEAD AND NECK TECHNIQUE: Multidetector CT imaging of the head and neck was performed using the standard protocol during bolus administration of intravenous contrast. Multiplanar CT image reconstructions and MIPs were obtained to evaluate the vascular anatomy. Carotid stenosis measurements (when applicable) are obtained utilizing NASCET criteria, using the distal internal carotid diameter as the denominator. CONTRAST:  75 mL Isovue 370 COMPARISON:  Head CT without contrast 1636 hours today. FINDINGS: CTA NECK Skeleton:  No acute osseous abnormality identified. Mild paranasal sinus mucosal thickening. Sphenoid sinuses are spared. Tympanic cavities and mastoids are clear. Other neck: Negative lung apices. No superior mediastinal lymphadenopathy. Thyroid, larynx, pharynx, parapharyngeal spaces, retropharyngeal space, sublingual space, submandibular glands, and parotid glands are within normal limits. No cervical lymphadenopathy. Aortic arch: 3 vessel arch configuration. No arch atherosclerosis or great vessel origin stenosis. Right carotid system: Negative. Left carotid system: Negative. Vertebral arteries:Negative; no proximal subclavian artery stenosis, normal vertebral artery origins and no vertebral artery stenosis in the neck. The left vertebral artery is non dominant. CTA HEAD Posterior circulation: Mildly dominant distal right vertebral artery. Normal PICA origins. No distal vertebral artery stenosis. Patent vertebrobasilar junction. No basilar stenosis. Normal SCA and PCA origins. Both posterior communicating arteries are present, the left is diminutive. There is a 6 mm segment of occlusion in the distal left PCA P2 segment. There is reconstituted flow distally. This is best seen on series 11, image 21. Right PCA branches are within normal  limits. Anterior circulation: Both ICA siphons are patent.  Both ophthalmic and posterior communicating artery origins are within normal limits. Both carotid termini are patent. The left ACA is non dominant. Anterior communicating artery and ACA branches are within normal limits. Left MCA M1 segment, bifurcation, and left MCA branches are within normal limits. Right MCA M1 segment, bifurcation, and right MCA branches are within normal limits. Venous sinuses: Patent. Anatomic variants: Non dominant left vertebral artery and left ACA. Delayed phase: No abnormal enhancement identified. No cortically based acute infarct identified. No intracranial hemorrhage or mass effect. IMPRESSION: 1. Negative for emergent large vessel occlusion but Positive for segmental Occlusion vs. High-grade Stenosis in the Left PCA P2 segment. There is reconstituted distal left PCA flow. See series 11, image 21. 2. Stable and negative CT appearance of the brain. 3. No atherosclerosis or stenosis in the neck. Negative anterior circulation. Electronically Signed: By: Genevie Ann M.D. On: 11/01/2015 20:27   Mr Brain Wo Contrast  11/02/2015  CLINICAL DATA:  45 year old male with un steady gait, blurred vision right lower extremity paresthesias. Subsequent encounter. EXAM: MRI HEAD WITHOUT CONTRAST MRA HEAD WITHOUT CONTRAST TECHNIQUE: Multiplanar, multiecho pulse sequences of the brain and surrounding structures were obtained without intravenous contrast. Angiographic images of the head were obtained using MRA technique without contrast. COMPARISON:  CT angiogram 11/01/2015. 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 No intracranial hemorrhage. Very mild periventricular white matter changes may be related to result of small vessel disease. No age advanced atrophy or hydrocephalus. No intracranial mass lesion noted on this unenhanced exam. Decreased signal intensity of bone marrow (most notable upper cervical spine) may be related to patient's habitus.  Correlation with CBC to exclude anemia contributing to this appearance may be considered Cervical medullary junction, pituitary region, pineal region and orbital structures unremarkable. Junction Mild paranasal sinus mucosal thickening. MRA HEAD FINDINGS Narrowing of the cavernous segment of the internal carotid artery greater on left. This is felt to represent overestimation of degree of narrowing given the appearance on recent CT angiogram. Mild irregularity M1 segment middle cerebral artery bilaterally. Minimal bulge superior margin felt to be origin of a vessel rather than aneurysm. Moderate narrowing M2 segment /middle cerebral artery branches bilaterally. Mild to moderate narrowing A1 segment left anterior cerebral artery. Moderate to marked narrowing A2 segment left anterior cerebral artery. Mild irregularity with minimal to mild narrowing portions of right anterior cerebral artery. Moderate narrowing distal vertebral arteries more notable on the left. Nonvisualized posterior inferior cerebellar arteries and anterior inferior cerebellar arteries bilaterally. Mild to slightly moderate narrowing and irregularity basilar artery. 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 Mild to moderate narrowing of portions of the right posterior cerebral artery. Moderate narrowing superior cerebellar artery bilaterally. 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 Decreased signal intensity of bone marrow (most notable upper cervical spine) may be related to patient's habitus. Correlation with CBC to exclude anemia contributing to this appearance may be considered MRA HEAD MR angiogram appears to overestimate degree of narrowing given the appearance on recent CT angiogram as detailed above. 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 Electronically Signed   By: Genia Del M.D.   On: 11/02/2015 11:54   Mr Femur Left  W Wo Contrast  11/06/2015  CLINICAL DATA:  Left lower extremity, medial left thigh mass EXAM: MR OF THE LEFT LOWER EXTREMITY WITHOUT AND  WITH CONTRAST TECHNIQUE: Multiplanar, multisequence MR imaging of the lower left extremity was performed both before and after administration of intravenous contrast. CONTRAST:  13mL MULTIHANCE GADOBENATE DIMEGLUMINE 529 MG/ML IV SOLN COMPARISON:  None. FINDINGS: No acute fracture, dislocation or avascular necrosis. No periosteal reaction or bone destruction. Mild linear T2 hyperintense signal in the right femoral neck likely reflecting red marrow. Well-circumscribed 7.1 x 7.3 x 9.2 cm nonenhancing mass just posterior to the left sartorius muscle adjacent to the neurovascular bundle. The mass is predominantly T2 hyperintense with interspersed areas of T1 hyperintensity consistent with fat. No other soft tissue mass. Muscles are normal in signal. No muscle edema or atrophy. No other fluid collection or hematoma. IMPRESSION: 1. Well-circumscribed 7.1 x 7.3 x 9.2 cm nonenhancing mass just posterior to the left sartorius muscle adjacent to the neurovascular bundle. The mass is predominantly T2 hyperintense with interspersed areas of T1 hyperintensity consistent with fat. 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 Electronically Signed   By: Kathreen Devoid   On: 11/06/2015 10:57   Mr Jodene Nam Head/brain Wo Cm  11/02/2015  CLINICAL DATA:  45 year old male with un steady gait, blurred vision right lower extremity paresthesias. Subsequent encounter. EXAM: MRI HEAD WITHOUT CONTRAST MRA HEAD WITHOUT CONTRAST TECHNIQUE: Multiplanar, multiecho pulse sequences of the brain and surrounding structures were obtained without intravenous contrast. Angiographic images of the head were obtained using MRA technique without contrast. COMPARISON:  CT angiogram 11/01/2015. 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 No intracranial  hemorrhage. Very mild periventricular white matter changes may be related to result of small vessel disease. No age advanced atrophy or hydrocephalus. No intracranial mass lesion noted on this unenhanced exam. Decreased signal intensity of bone marrow (most notable upper cervical spine) may be related to patient's habitus. Correlation with CBC to exclude anemia contributing to this appearance may be considered Cervical medullary junction, pituitary region, pineal region and orbital structures unremarkable. Junction Mild paranasal sinus mucosal thickening. MRA HEAD FINDINGS Narrowing of the cavernous segment of the internal carotid artery greater on left. This is felt to represent overestimation of degree of narrowing given the appearance on recent CT angiogram. Mild irregularity M1 segment middle cerebral artery bilaterally. Minimal bulge superior margin felt to be origin of a vessel rather than aneurysm. Moderate narrowing M2 segment /middle cerebral artery branches bilaterally. Mild to moderate narrowing A1 segment left anterior cerebral artery. Moderate to marked narrowing A2 segment left anterior cerebral artery. Mild irregularity with minimal to mild narrowing portions of right anterior cerebral artery. Moderate narrowing distal vertebral arteries more notable on the left. Nonvisualized posterior inferior cerebellar arteries and anterior inferior cerebellar arteries bilaterally. Mild to slightly moderate narrowing and irregularity basilar artery. 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 Mild to moderate narrowing of portions of the right posterior cerebral artery. Moderate narrowing superior cerebellar artery bilaterally. 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 Decreased signal intensity of bone marrow (most notable upper cervical spine) may be related to patient's habitus. Correlation with CBC to exclude anemia contributing  to this appearance may be considered MRA HEAD MR angiogram appears to overestimate degree of narrowing given the appearance on recent CT angiogram as detailed above. 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 Electronically Signed   By: Genia Del M.D.   On: 11/02/2015 11:54    ASSESSMENT AND PLAN   1. Cardiomyopathy - Echo  showed LV EF of 20-25%, diffuse hypokinesis, trivial aortic regur, mild dilated LA and RA. Mild dilated RV. EKG 11/01/15 showed sinus rhythm at rate of 101bpm, LBBB, TWI in inferior lead. No prior EKG to compare. 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 Advised to stop drinking.  - Started on ASA, lipitor and toprol XL 25mg . No anginal pain or SOB.  - Will need repeat echo in 3 months. Will need ischemic evaluation at some point given LBBB and T wave abnormality. Will schedule outpatient f/u in 3 weeks and cath afterwards in few weeks. F/u appointment has been schedule.  - start 25mg  daily losartan today. Also discussed risk and benefit of cath, Risk and benefit of procedure explained to the patient who display clear understanding and agree to proceed. Discussed with patient possible procedural risk include bleeding, vascular injury, renal injury, arrythmia, MI, stroke and loss of limb or life. Cath will be arranged on followup  2. 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 I have discussed with patient risk and benefit of procedure include risk of hypotension, bradycardia, aspiration, esophageal trauma or rupture, he is agreeable. He is set up to have outpatient TEE this Thur at [facility]. Patient will need to arrive 1.5 hours prior to procedure  3. HLD (hyperlipidemia)  - Continue statin. Goal less than 70. Will need LFT and lipid panel check in 6 weeks.   4. Right thigh pain -LE doppler negative for DVT on  preliminary report.   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  Signed, Almyra Deforest PA-C Pager: [phone]  I have examined the patient and reviewed assessment and plan and discussed with patient.  Agree with above as stated.  He feels well. Planning discharge in AM.  TEE Thursday as outpatient.  Mairi Stagliano S.

## 2015-11-09 MED ORDER — ATORVASTATIN CALCIUM 40 MG PO TABS: 40.0000 mg | ORAL_TABLET | Freq: Every day | ORAL | Status: DC

## 2015-11-09 MED ORDER — ASPIRIN 325 MG PO TBEC: 325.0000 mg | DELAYED_RELEASE_TABLET | Freq: Every day | ORAL | Status: DC

## 2015-11-09 MED ORDER — METOPROLOL SUCCINATE ER 25 MG PO TB24: 25.0000 mg | ORAL_TABLET | Freq: Every day | ORAL | Status: DC

## 2015-11-09 MED ORDER — LOSARTAN POTASSIUM 25 MG PO TABS: 25.0000 mg | ORAL_TABLET | Freq: Every day | ORAL | Status: DC

## 2015-11-09 NOTE — Discharge Instructions (Signed)
Inpatient Rehab Discharge Instructions  Austin Holmes Discharge date and time:  11/09/15  Activities/Precautions/ Functional Status: Activity: no lifting, driving, or strenuous exercise for till cleared by MD.   Diet: cardiac diet Low salt.  Wound Care: none needed   Functional status:  ___ No restrictions     ___ Walk up steps independently ___ 24/7 supervision/assistance   ___ Walk up steps with assistance _X__ Intermittent supervision/assistance  _X__ Bathe/dress independently ___ Walk with walker     ___ Bathe/dress with assistance ___ Walk Independently    ___ Shower independently ___ Walk with assistance    ___ Shower with assistance _X__ No alcohol     ___ Return to work/school ________  Special Instructions:    STROKE/TIA DISCHARGE INSTRUCTIONS SMOKING Cigarette smoking nearly doubles your risk of having a stroke & is the single most alterable risk factor  If you smoke or have smoked in the last 12 months, you are advised to quit smoking for your health.  Most of the excess cardiovascular risk related to smoking disappears within a year of stopping.  Ask you doctor about anti-smoking medications  [facility] Quit Line: 1-800-QUIT NOW  Free Smoking Cessation Classes [phone]  CHOLESTEROL Know your levels; limit fat & cholesterol in your diet  Lipid Panel     Component Value Date/Time   CHOL 202* 11/02/2015 0557   TRIG 37 11/02/2015 0557   HDL 91 11/02/2015 0557   CHOLHDL 2.2 11/02/2015 0557   VLDL 7 11/02/2015 0557   LDLCALC 104* 11/02/2015 0557      Many patients benefit from treatment even if their cholesterol is at goal.  Goal: Total Cholesterol (CHOL) less than 160  Goal:  Triglycerides (TRIG) less than 150  Goal:  HDL greater than 40  Goal:  LDL (LDLCALC) less than 100   BLOOD PRESSURE American Stroke Association blood pressure target is less that 120/80 mm/Hg  Your discharge blood pressure is:  BP: 121/72 mmHg  Monitor your blood  pressure  Limit your salt and alcohol intake  Many individuals will require more than one medication for high blood pressure  DIABETES (A1c is a blood sugar average for last 3 months) Goal HGBA1c is under 7% (HBGA1c is blood sugar average for last 3 months)  Diabetes: No known diagnosis of diabetes    Lab Results  Component Value Date   HGBA1C 5.0 11/02/2015     Your HGBA1c can be lowered with medications, healthy diet, and exercise.  Check your blood sugar as directed by your physician  Call your physician if you experience unexplained or low blood sugars.  PHYSICAL ACTIVITY/REHABILITATION Goal is 30 minutes at least 4 days per week  Activity: No driving, Therapies:  See above Return to work:  N/A  Activity decreases your risk of heart attack and stroke and makes your heart stronger.  It helps control your weight and blood pressure; helps you relax and can improve your mood.  Participate in a regular exercise program.  Talk with your doctor about the best form of exercise for you (dancing, walking, swimming, cycling).  DIET/WEIGHT Goal is to maintain a healthy weight  Your discharge diet is: Diet Heart Room service appropriate?: Yes; Fluid consistency:: Thin liquids Your height is:  Height: 6\' 3"  (190.5 cm) Your current weight is: Weight: 105.643 kg (232 lb 14.4 oz) Your Body Mass Index (BMI) is:  BMI (Calculated): 29.2  Following the type of diet specifically designed for you will help prevent another stroke.  Your  goal weight is: 200 lbs  Your goal Body Mass Index (BMI) is 19-24.  Healthy food habits can help reduce 3 risk factors for stroke:  High cholesterol, hypertension, and excess weight.  RESOURCES Stroke/Support Group:  Call [phone]   STROKE EDUCATION PROVIDED/REVIEWED AND GIVEN TO PATIENT Stroke warning signs and symptoms How to activate emergency medical system (call 911). Medications prescribed at discharge. Need for follow-up after discharge. Personal  risk factors for stroke. Pneumonia vaccine given:  Flu vaccine given:  My questions have been answered, the writing is legible, and I understand these instructions.  I will adhere to these goals & educational materials that have been provided to me after my discharge from the hospital.      My questions have been answered and I understand these instructions. I will adhere to these goals and the provided educational materials after my discharge from the hospital.  Patient/Caregiver Signature _______________________________ Date __________  Clinician Signature _______________________________________ Date __________  Please bring this form and your medication list with you to all your follow-up doctor's appointments.

## 2015-11-09 NOTE — Progress Notes (Signed)
Subjective/Complaints: Patient seen sitting up in his chair this morning. He is dressed and asks if he can leave now.  ROS- denies fevers, chills, CP, SOB, N/V/D   Objective: Vital Signs: Blood pressure 121/72, pulse 83, temperature 98.8 F (37.1 C), temperature source Oral, resp. rate 18, height 6\' 3"  (1.905 m), weight 105.643 kg (232 lb 14.4 oz), SpO2 100 %. No results found. No results found for this or any previous visit (from the past 72 hour(s)).   HEENT: Normocephalic. Atraumatic. Cardio: RRR and no murmur Resp: CTA B/L and unlabored GI: BS positive and NT, ND Skin:   Intact. warm and dry. Neuro: Alert/Oriented Motor: 5/5 throughout. Musc/Skel:  No edema. No tenderness. Gen NAD. Vital signs reviewed. Psych: Impulsive and slightly anxious.  Assessment/Plan: 1. 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 which require 3+ hours per day of interdisciplinary therapy in a comprehensive inpatient rehab setting. Physiatrist is providing close team supervision and 24 hour management of active medical problems listed below. Physiatrist and rehab team continue to assess barriers to discharge/monitor patient progress toward functional and medical goals. FIM: Function - Bathing Position: Shower Body parts bathed by patient: Right arm, Left arm, Chest, Abdomen, Front perineal area, Buttocks, Right upper leg, Left upper leg, Right lower leg, Left lower leg, Back Assist Level: More than reasonable time (per pt report) Set up : To obtain items, To adjust water temperature  Function- Upper Body Dressing/Undressing What is the patient wearing?: Pull over shirt/dress Pull over shirt/dress - Perfomed by patient: Thread/unthread right sleeve, Thread/unthread left sleeve, Put head through opening, Pull shirt over trunk Assist Level: No help, No cues (per pt report) Set up : To obtain clothing/put away Function - Lower Body Dressing/Undressing What is  the patient wearing?: Pants, Shoes, Socks Position:  (standing for pants and sitting socks and shoes) Pants- Performed by patient: Thread/unthread right pants leg, Thread/unthread left pants leg, Pull pants up/down Socks - Performed by patient: Don/doff right sock, Don/doff left sock Shoes - Performed by patient: Don/doff right shoe, Don/doff left shoe Assist for footwear: Independent Assist for lower body dressing: No Help, No cues (per pt report) Set up : To obtain clothing/put away  Function - Toileting Toileting steps completed by patient: Adjust clothing prior to toileting, Performs perineal hygiene, Adjust clothing after toileting Toileting Assistive Devices: Grab bar or rail Assist level: More than reasonable time  Function - Air cabin crew transfer assistive device: Grab bar Assist level to toilet: No Help, No cues Assist level from toilet: No Help, No cues  Function - Chair/bed transfer Chair/bed transfer method: Ambulatory Chair/bed transfer assist level: No help, no cues Chair/bed transfer assistive device: Armrests Chair/bed transfer details: Verbal cues for precautions/safety  Function - Locomotion: Wheelchair Will patient use wheelchair at discharge?: No Function - Locomotion: Ambulation Assistive device: No device Max distance: 500 Assist level: No help, No cues, assistive device, takes more than a reasonable amount of time Assist level: No help, No cues, assistive device, takes more than a reasonable amount of time Assist level: No help, No cues, assistive device, takes more than a reasonable amount of time Assist level: No help, No cues, assistive device, takes more than a reasonable amount of time Assist level: No help, No cues, assistive device, takes more than a reasonable amount of time  Function - Comprehension Comprehension: Auditory Comprehension assist level: Follows complex conversation/direction with no assist  Function -  Expression Expression: Verbal Expression assist level: Expresses complex  ideas: With no assist  Function - Social Interaction Social Interaction assist level: Interacts appropriately with others - No medications needed.  Function - Problem Solving Problem solving assist level: Solves complex problems: With extra time  Function - Memory Memory assist level: More than reasonable amount of time Patient normally able to recall (first 3 days only): Location of own room, Staff names and faces, That he or she is in a hospital, Current season 1.  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  DC today  Will see patient in 1-2 weeks for transitional care management             Cont meds 2.  DVT Prophylaxis/Anticoagulation: Mechanical: Sequential compression devices, below knee Bilateral lower extremities, RLE no DVT. Pharmaceutical: Lovenox 3. Pain Management: Tylenol PRN. 4. Mood: Environmental support. 5. Neuropsych: This patient is capable of making decisions on his own behalf. 6. Skin/Wound Care: Routine skin care. 7. Fluids/Electrolytes/Nutrition: Routine I/Os 8. Dyslipidemia: Lipitor 9. Systolic CHF: Cont toprolol.     Appreciate cardiology consult:   Consider starting Lisinopril 2.5 as outpatient   TEE as outpatient  Follow as outpatient for ischemic evaluation 10.  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 Workup as outpatient  LOS (Days) 6 A FACE TO FACE EVALUATION WAS PERFORMED  Arin Vanosdol Lorie Phenix 11/09/2015, 10:04 AM

## 2015-11-09 NOTE — Progress Notes (Signed)
Patient discharged to home, accompanied by his mother.

## 2015-11-10 LAB — FACTOR 5 LEIDEN

## 2015-11-10 LAB — CARDIOLIPIN ANTIBODIES, IGG, IGM, IGA
Anticardiolipin IgA: <9 APL U/mL (ref 0–11)
Anticardiolipin IgG: <9 GPL U/mL (ref 0–14)
Anticardiolipin IgM: 10 MPL U/mL (ref 0–12)

## 2015-11-10 LAB — LUPUS ANTICOAGULANT PANEL
DRVVT: 33 s (ref 0.0–44.0)
PTT Lupus Anticoagulant: 38.1 s (ref 0.0–43.6)

## 2015-11-10 LAB — HOMOCYSTEINE: Homocysteine: 10.4 umol/L (ref 0.0–15.0)

## 2015-11-10 LAB — PROTEIN S, TOTAL: Protein S Ag, Total: 81 % (ref 60–150)

## 2015-11-10 LAB — ANTINUCLEAR ANTIBODIES, IFA: ANA Ab, IFA: NEGATIVE

## 2015-11-10 LAB — PROTEIN C, TOTAL: Protein C, Total: 69 % (ref 60–150)

## 2015-11-10 NOTE — Progress Notes (Signed)
Social Work Discharge Note  The overall goal for the admission was met for:   Discharge location: Yes - home with his mother to provide supervision, as needed  Length of Stay: Yes  Discharge activity level: Yes - modified independent with supervision for cooking and higher level cognitive tasks  Home/community participation: Yes  Services provided included: MD, RD, PT, OT, SLP, RN, Pharmacy and SW  Financial Services: Other: pt is currently not insured   Follow-up services arranged: Other: [facility] program for assistance with medications  Comments (or additional information): Pt to return to his home he shares with his mother.  She can provide supervision, as needed.  She obtained a PCP for pt in [place] at [facility].  Pt was set up with [facility] program to assist with first month's supply of medication.  Pt did not need, nor meet criteria for f/u therapies and required no DME.    Patient/Family verbalized understanding of follow-up arrangements: Yes  Individual responsible for coordination of the follow-up plan: pt with support from his mother  Confirmed correct DME delivered: Austin Holmes 11/10/2015    Austin Holmes, Austin Holmes

## 2015-11-11 ENCOUNTER — Ambulatory Visit (HOSPITAL_COMMUNITY)
Admission: RE | Admit: 2015-11-11 | Discharge: 2015-11-11 | Disposition: A | Discharge status: Home / Self Care | Payer: Self-pay | Source: Ambulatory Visit | Attending: Cardiovascular Disease | Admitting: Cardiovascular Disease

## 2015-11-11 ENCOUNTER — Encounter (HOSPITAL_COMMUNITY): Payer: Self-pay | Admitting: *Deleted

## 2015-11-11 ENCOUNTER — Ambulatory Visit (HOSPITAL_COMMUNITY)
Admission: RE | Admit: 2015-11-11 | Discharge: 2015-11-11 | Disposition: A | Discharge status: Home / Self Care | Payer: Self-pay | Source: Ambulatory Visit | Attending: Physician Assistant | Admitting: Physician Assistant

## 2015-11-11 ENCOUNTER — Encounter (HOSPITAL_COMMUNITY)
Admission: RE | Disposition: A | Discharge status: Home / Self Care | Payer: Self-pay | Source: Ambulatory Visit | Attending: Cardiovascular Disease

## 2015-11-11 ENCOUNTER — Telehealth: Payer: Self-pay | Admitting: *Deleted

## 2015-11-11 DIAGNOSIS — I429 Cardiomyopathy, unspecified: Secondary | ICD-10-CM | POA: Insufficient documentation

## 2015-11-11 DIAGNOSIS — E785 Hyperlipidemia, unspecified: Secondary | ICD-10-CM | POA: Insufficient documentation

## 2015-11-11 DIAGNOSIS — I639 Cerebral infarction, unspecified: Secondary | ICD-10-CM

## 2015-11-11 DIAGNOSIS — Z7982 Long term (current) use of aspirin: Secondary | ICD-10-CM | POA: Insufficient documentation

## 2015-11-11 DIAGNOSIS — Z8673 Personal history of transient ischemic attack (TIA), and cerebral infarction without residual deficits: Secondary | ICD-10-CM | POA: Insufficient documentation

## 2015-11-11 DIAGNOSIS — Z79899 Other long term (current) drug therapy: Secondary | ICD-10-CM | POA: Insufficient documentation

## 2015-11-11 DIAGNOSIS — Z7901 Long term (current) use of anticoagulants: Secondary | ICD-10-CM | POA: Insufficient documentation

## 2015-11-11 HISTORY — PX: TEE WITHOUT CARDIOVERSION: SHX5443

## 2015-11-11 HISTORY — DX: Cerebral infarction, unspecified: I63.9

## 2015-11-11 SURGERY — ECHOCARDIOGRAM, TRANSESOPHAGEAL
Anesthesia: Moderate Sedation

## 2015-11-11 MED ORDER — BUTAMBEN-TETRACAINE-BENZOCAINE 2-2-14 % EX AERO
INHALATION_SPRAY | CUTANEOUS | Status: DC | PRN
  Administered 2015-11-11: 2 via TOPICAL

## 2015-11-11 MED ORDER — FENTANYL CITRATE (PF) 100 MCG/2ML IJ SOLN
INTRAMUSCULAR | Status: DC | PRN
  Administered 2015-11-11: 50 ug via INTRAVENOUS
  Administered 2015-11-11: 25 ug via INTRAVENOUS

## 2015-11-11 MED ORDER — MIDAZOLAM HCL 5 MG/ML IJ SOLN
INTRAMUSCULAR | Status: AC
  Filled 2015-11-11: qty 2

## 2015-11-11 MED ORDER — FENTANYL CITRATE (PF) 100 MCG/2ML IJ SOLN
INTRAMUSCULAR | Status: AC
  Filled 2015-11-11: qty 2

## 2015-11-11 MED ORDER — SODIUM CHLORIDE 0.9 % IV SOLN
INTRAVENOUS | Status: DC
  Administered 2015-11-11: 08:00:00 via INTRAVENOUS

## 2015-11-11 MED ORDER — MIDAZOLAM HCL 10 MG/2ML IJ SOLN
INTRAMUSCULAR | Status: DC | PRN
  Administered 2015-11-11: 1 mg via INTRAVENOUS
  Administered 2015-11-11 (×2): 2 mg via INTRAVENOUS

## 2015-11-11 NOTE — H&P (View-Only) (Signed)
Patient Name: Austin Holmes Date of Encounter: 11/08/2015   SUBJECTIVE  Feeling well. No chest pain, sob or palpitations.   CURRENT MEDS . aspirin EC  325 mg Oral Daily  . atorvastatin  40 mg Oral q1800  . enoxaparin (LOVENOX) injection  40 mg Subcutaneous Q24H  . metoprolol succinate  25 mg Oral Daily    OBJECTIVE  Filed Vitals:   11/07/15 0851 11/07/15 1350 11/08/15 0521 11/08/15 0908  BP: 120/78 116/80 122/73 129/77  Pulse: 89 69 73 76  Temp:  98 F (36.7 C) 98.1 F (36.7 C)   TempSrc:  Oral Oral   Resp:  18 20   Height:      Weight:      SpO2:  98% 100%     Intake/Output Summary (Last 24 hours) at 11/08/15 1213 Last data filed at 11/08/15 0800  Gross per 24 hour  Intake    720 ml  Output      0 ml  Net    720 ml   Filed Weights   11/03/15 1615  Weight: 232 lb 14.4 oz (105.643 kg)    PHYSICAL EXAM  General: Well developed, well nourished, male in no acute distress Head: Eyes PERRLA, No xanthomas. Normocephalic and atraumatic, oropharynx without edema or exudate.  Lungs: Resp regular and unlabored, CTA. Heart: RRR no s3, s4, or murmurs..  Neck: No carotid bruits. No lymphadenopathy. No JVD. Abdomen: Bowel sounds present, abdomen soft and non-tender without masses or hernias noted. Msk: No spine or cva tenderness. No weakness, no joint deformities or effusions. Extremities: No clubbing, cyanosis or edema. DP/PT/Radials 2+ and equal bilaterally. L thigh mobil, rubbery knot. R thigh textured knot. Neuro: Alert and oriented X 3. No focal deficits noted. Psych: Good affect, responds appropriately Skin: No rashes or lesions noted.   Radiology/Studies  Ct Angio Head W/cm &/or Wo Cm  11/01/2015  ADDENDUM REPORT: 11/01/2015 20:36 ADDENDUM: Study discussed by telephone with Dr. Doren Custard STAFFORD on 11/01/2015 at 2030 hours. Electronically Signed   By: Genevie Ann M.D.   On: 11/01/2015 20:36  11/01/2015  CLINICAL DATA:  45 year old male with unsteady gait  today, blurred vision, right lower extremity paresthesia. Initial encounter. EXAM: CT ANGIOGRAPHY HEAD AND NECK TECHNIQUE: Multidetector CT imaging of the head and neck was performed using the standard protocol during bolus administration of intravenous contrast. Multiplanar CT image reconstructions and MIPs were obtained to evaluate the vascular anatomy. Carotid stenosis measurements (when applicable) are obtained utilizing NASCET criteria, using the distal internal carotid diameter as the denominator. CONTRAST:  75 mL Isovue 370 COMPARISON:  Head CT without contrast 1636 hours today. FINDINGS: CTA NECK Skeleton:  No acute osseous abnormality identified. Mild paranasal sinus mucosal thickening. Sphenoid sinuses are spared. Tympanic cavities and mastoids are clear. Other neck: Negative lung apices. No superior mediastinal lymphadenopathy. Thyroid, larynx, pharynx, parapharyngeal spaces, retropharyngeal space, sublingual space, submandibular glands, and parotid glands are within normal limits. No cervical lymphadenopathy. Aortic arch: 3 vessel arch configuration. No arch atherosclerosis or great vessel origin stenosis. Right carotid system: Negative. Left carotid system: Negative. Vertebral arteries:Negative; no proximal subclavian artery stenosis, normal vertebral artery origins and no vertebral artery stenosis in the neck. The left vertebral artery is non dominant. CTA HEAD Posterior circulation: Mildly dominant distal right vertebral artery. Normal PICA origins. No distal vertebral artery stenosis. Patent vertebrobasilar junction. No basilar stenosis. Normal SCA and PCA origins. Both posterior communicating arteries are present, the left is diminutive. There is a 6 mm  segment of occlusion in the distal left PCA P2 segment. There is reconstituted flow distally. This is best seen on series 11, image 21. Right PCA branches are within normal limits. Anterior circulation: Both ICA siphons are patent. Both ophthalmic  and posterior communicating artery origins are within normal limits. Both carotid termini are patent. The left ACA is non dominant. Anterior communicating artery and ACA branches are within normal limits. Left MCA M1 segment, bifurcation, and left MCA branches are within normal limits. Right MCA M1 segment, bifurcation, and right MCA branches are within normal limits. Venous sinuses: Patent. Anatomic variants: Non dominant left vertebral artery and left ACA. Delayed phase: No abnormal enhancement identified. No cortically based acute infarct identified. No intracranial hemorrhage or mass effect. IMPRESSION: 1. Negative for emergent large vessel occlusion but Positive for segmental Occlusion vs. High-grade Stenosis in the Left PCA P2 segment. There is reconstituted distal left PCA flow. See series 11, image 21. 2. Stable and negative CT appearance of the brain. 3. No atherosclerosis or stenosis in the neck. Negative anterior circulation. Electronically Signed: By: Genevie Ann M.D. On: 11/01/2015 20:27   Ct Head Wo Contrast  11/01/2015  CLINICAL DATA:  Right leg numbness since yesterday EXAM: CT HEAD WITHOUT CONTRAST TECHNIQUE: Contiguous axial images were obtained from the base of the skull through the vertex without intravenous contrast. COMPARISON:  None. FINDINGS: No acute intracranial abnormality. Specifically, no hemorrhage, hydrocephalus, mass lesion, acute infarction, or significant intracranial injury. No acute calvarial abnormality. Visualized paranasal sinuses and mastoids clear. Orbital soft tissues unremarkable. IMPRESSION: No acute intracranial abnormality. Electronically Signed   By: Rolm Baptise M.D.   On: 11/01/2015 16:40   Ct Angio Neck W/cm &/or Wo/cm  11/01/2015  ADDENDUM REPORT: 11/01/2015 20:36 ADDENDUM: Study discussed by telephone with Dr. Doren Custard STAFFORD on 11/01/2015 at 2030 hours. Electronically Signed   By: Genevie Ann M.D.   On: 11/01/2015 20:36  11/01/2015  CLINICAL DATA:  45 year old male  with unsteady gait today, blurred vision, right lower extremity paresthesia. Initial encounter. EXAM: CT ANGIOGRAPHY HEAD AND NECK TECHNIQUE: Multidetector CT imaging of the head and neck was performed using the standard protocol during bolus administration of intravenous contrast. Multiplanar CT image reconstructions and MIPs were obtained to evaluate the vascular anatomy. Carotid stenosis measurements (when applicable) are obtained utilizing NASCET criteria, using the distal internal carotid diameter as the denominator. CONTRAST:  75 mL Isovue 370 COMPARISON:  Head CT without contrast 1636 hours today. FINDINGS: CTA NECK Skeleton:  No acute osseous abnormality identified. Mild paranasal sinus mucosal thickening. Sphenoid sinuses are spared. Tympanic cavities and mastoids are clear. Other neck: Negative lung apices. No superior mediastinal lymphadenopathy. Thyroid, larynx, pharynx, parapharyngeal spaces, retropharyngeal space, sublingual space, submandibular glands, and parotid glands are within normal limits. No cervical lymphadenopathy. Aortic arch: 3 vessel arch configuration. No arch atherosclerosis or great vessel origin stenosis. Right carotid system: Negative. Left carotid system: Negative. Vertebral arteries:Negative; no proximal subclavian artery stenosis, normal vertebral artery origins and no vertebral artery stenosis in the neck. The left vertebral artery is non dominant. CTA HEAD Posterior circulation: Mildly dominant distal right vertebral artery. Normal PICA origins. No distal vertebral artery stenosis. Patent vertebrobasilar junction. No basilar stenosis. Normal SCA and PCA origins. Both posterior communicating arteries are present, the left is diminutive. There is a 6 mm segment of occlusion in the distal left PCA P2 segment. There is reconstituted flow distally. This is best seen on series 11, image 21. Right PCA branches are within normal  limits. Anterior circulation: Both ICA siphons are patent.  Both ophthalmic and posterior communicating artery origins are within normal limits. Both carotid termini are patent. The left ACA is non dominant. Anterior communicating artery and ACA branches are within normal limits. Left MCA M1 segment, bifurcation, and left MCA branches are within normal limits. Right MCA M1 segment, bifurcation, and right MCA branches are within normal limits. Venous sinuses: Patent. Anatomic variants: Non dominant left vertebral artery and left ACA. Delayed phase: No abnormal enhancement identified. No cortically based acute infarct identified. No intracranial hemorrhage or mass effect. IMPRESSION: 1. Negative for emergent large vessel occlusion but Positive for segmental Occlusion vs. High-grade Stenosis in the Left PCA P2 segment. There is reconstituted distal left PCA flow. See series 11, image 21. 2. Stable and negative CT appearance of the brain. 3. No atherosclerosis or stenosis in the neck. Negative anterior circulation. Electronically Signed: By: Genevie Ann M.D. On: 11/01/2015 20:27   Mr Brain Wo Contrast  11/02/2015  CLINICAL DATA:  45 year old male with un steady gait, blurred vision right lower extremity paresthesias. Subsequent encounter. EXAM: MRI HEAD WITHOUT CONTRAST MRA HEAD WITHOUT CONTRAST TECHNIQUE: Multiplanar, multiecho pulse sequences of the brain and surrounding structures were obtained without intravenous contrast. Angiographic images of the head were obtained using MRA technique without contrast. COMPARISON:  CT angiogram 11/01/2015. 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 No intracranial hemorrhage. Very mild periventricular white matter changes may be related to result of small vessel disease. No age advanced atrophy or hydrocephalus. No intracranial mass lesion noted on this unenhanced exam. Decreased signal intensity of bone marrow (most notable upper cervical spine) may be related to patient's habitus.  Correlation with CBC to exclude anemia contributing to this appearance may be considered Cervical medullary junction, pituitary region, pineal region and orbital structures unremarkable. Junction Mild paranasal sinus mucosal thickening. MRA HEAD FINDINGS Narrowing of the cavernous segment of the internal carotid artery greater on left. This is felt to represent overestimation of degree of narrowing given the appearance on recent CT angiogram. Mild irregularity M1 segment middle cerebral artery bilaterally. Minimal bulge superior margin felt to be origin of a vessel rather than aneurysm. Moderate narrowing M2 segment /middle cerebral artery branches bilaterally. Mild to moderate narrowing A1 segment left anterior cerebral artery. Moderate to marked narrowing A2 segment left anterior cerebral artery. Mild irregularity with minimal to mild narrowing portions of right anterior cerebral artery. Moderate narrowing distal vertebral arteries more notable on the left. Nonvisualized posterior inferior cerebellar arteries and anterior inferior cerebellar arteries bilaterally. Mild to slightly moderate narrowing and irregularity basilar artery. 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 Mild to moderate narrowing of portions of the right posterior cerebral artery. Moderate narrowing superior cerebellar artery bilaterally. 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 Decreased signal intensity of bone marrow (most notable upper cervical spine) may be related to patient's habitus. Correlation with CBC to exclude anemia contributing to this appearance may be considered MRA HEAD MR angiogram appears to overestimate degree of narrowing given the appearance on recent CT angiogram as detailed above. 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 Electronically Signed   By: Genia Del M.D.   On: 11/02/2015 11:54   Mr Femur Left  W Wo Contrast  11/06/2015  CLINICAL DATA:  Left lower extremity, medial left thigh mass EXAM: MR OF THE LEFT LOWER EXTREMITY WITHOUT AND  WITH CONTRAST TECHNIQUE: Multiplanar, multisequence MR imaging of the lower left extremity was performed both before and after administration of intravenous contrast. CONTRAST:  68mL MULTIHANCE GADOBENATE DIMEGLUMINE 529 MG/ML IV SOLN COMPARISON:  None. FINDINGS: No acute fracture, dislocation or avascular necrosis. No periosteal reaction or bone destruction. Mild linear T2 hyperintense signal in the right femoral neck likely reflecting red marrow. Well-circumscribed 7.1 x 7.3 x 9.2 cm nonenhancing mass just posterior to the left sartorius muscle adjacent to the neurovascular bundle. The mass is predominantly T2 hyperintense with interspersed areas of T1 hyperintensity consistent with fat. No other soft tissue mass. Muscles are normal in signal. No muscle edema or atrophy. No other fluid collection or hematoma. IMPRESSION: 1. Well-circumscribed 7.1 x 7.3 x 9.2 cm nonenhancing mass just posterior to the left sartorius muscle adjacent to the neurovascular bundle. The mass is predominantly T2 hyperintense with interspersed areas of T1 hyperintensity consistent with fat. 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 Electronically Signed   By: Kathreen Devoid   On: 11/06/2015 10:57   Mr Jodene Nam Head/brain Wo Cm  11/02/2015  CLINICAL DATA:  45 year old male with un steady gait, blurred vision right lower extremity paresthesias. Subsequent encounter. EXAM: MRI HEAD WITHOUT CONTRAST MRA HEAD WITHOUT CONTRAST TECHNIQUE: Multiplanar, multiecho pulse sequences of the brain and surrounding structures were obtained without intravenous contrast. Angiographic images of the head were obtained using MRA technique without contrast. COMPARISON:  CT angiogram 11/01/2015. 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 No intracranial  hemorrhage. Very mild periventricular white matter changes may be related to result of small vessel disease. No age advanced atrophy or hydrocephalus. No intracranial mass lesion noted on this unenhanced exam. Decreased signal intensity of bone marrow (most notable upper cervical spine) may be related to patient's habitus. Correlation with CBC to exclude anemia contributing to this appearance may be considered Cervical medullary junction, pituitary region, pineal region and orbital structures unremarkable. Junction Mild paranasal sinus mucosal thickening. MRA HEAD FINDINGS Narrowing of the cavernous segment of the internal carotid artery greater on left. This is felt to represent overestimation of degree of narrowing given the appearance on recent CT angiogram. Mild irregularity M1 segment middle cerebral artery bilaterally. Minimal bulge superior margin felt to be origin of a vessel rather than aneurysm. Moderate narrowing M2 segment /middle cerebral artery branches bilaterally. Mild to moderate narrowing A1 segment left anterior cerebral artery. Moderate to marked narrowing A2 segment left anterior cerebral artery. Mild irregularity with minimal to mild narrowing portions of right anterior cerebral artery. Moderate narrowing distal vertebral arteries more notable on the left. Nonvisualized posterior inferior cerebellar arteries and anterior inferior cerebellar arteries bilaterally. Mild to slightly moderate narrowing and irregularity basilar artery. 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 Mild to moderate narrowing of portions of the right posterior cerebral artery. Moderate narrowing superior cerebellar artery bilaterally. 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 Decreased signal intensity of bone marrow (most notable upper cervical spine) may be related to patient's habitus. Correlation with CBC to exclude anemia contributing  to this appearance may be considered MRA HEAD MR angiogram appears to overestimate degree of narrowing given the appearance on recent CT angiogram as detailed above. 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 Electronically Signed   By: Genia Del M.D.   On: 11/02/2015 11:54    ASSESSMENT AND PLAN   1. Cardiomyopathy - Echo  showed LV EF of 20-25%, diffuse hypokinesis, trivial aortic regur, mild dilated LA and RA. Mild dilated RV. EKG 11/01/15 showed sinus rhythm at rate of 101bpm, LBBB, TWI in inferior lead. No prior EKG to compare. 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 Advised to stop drinking.  - Started on ASA, lipitor and toprol XL 25mg . No anginal pain or SOB.  - Will need repeat echo in 3 months. Will need ischemic evaluation at some point given LBBB and T wave abnormality. Will schedule outpatient f/u in 3 weeks and cath afterwards in few weeks. F/u appointment has been schedule.  - start 25mg  daily losartan today. Also discussed risk and benefit of cath, Risk and benefit of procedure explained to the patient who display clear understanding and agree to proceed. Discussed with patient possible procedural risk include bleeding, vascular injury, renal injury, arrythmia, MI, stroke and loss of limb or life. Cath will be arranged on followup  2. 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 I have discussed with patient risk and benefit of procedure include risk of hypotension, bradycardia, aspiration, esophageal trauma or rupture, he is agreeable. He is set up to have outpatient TEE this Thur at [facility]. Patient will need to arrive 1.5 hours prior to procedure  3. HLD (hyperlipidemia)  - Continue statin. Goal less than 70. Will need LFT and lipid panel check in 6 weeks.   4. Right thigh pain -LE doppler negative for DVT on  preliminary report.   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  Signed, Almyra Deforest PA-C Pager: [phone]  I have examined the patient and reviewed assessment and plan and discussed with patient.  Agree with above as stated.  He feels well. Planning discharge in AM.  TEE Thursday as outpatient.  Galaxy Borden S.

## 2015-11-11 NOTE — Telephone Encounter (Signed)
Transitional Care call:  Spoke with mother and caregiver [name]  1. Are you/is patient experiencing any problems since coming home? Are there any questions regarding any aspect of care?  No 2. Are there any questions regarding medications administration/dosing? Are meds being taken as prescribed? Patient should review meds with caller to confirm Reviewed medications and they are being taken as prescribed 3. Have there been any falls? No falls 4. Has Home Health been to the house and/or have they contacted you? If not, have you tried to contact them? Can we help you contact them? No services ordered 5. Are bowels and bladder emptying properly? Are there any unexpected incontinence issues? If applicable, is patient following bowel/bladder programs? No problems reported 6. Any fevers, problems with breathing, unexpected pain? No 7. Are there any skin problems or new areas of breakdown? No issues 8. Has the patient/family member arranged specialty MD follow up (ie cardiology/neurology/renal/surgical/etc)?  Can we help arrange? Appointments have been scheduled and are on Mrs Amalia Hailey schedule 9. Does the patient need any other services or support that we can help arrange? No 10. Are caregivers following through as expected in assisting the patient? Yes 11. Has the patient quit smoking, drinking alcohol, or using drugs as recommended? Never smoker   Appointment 11/22/15 @3 :15 pm arrive by 3:00.  Directions and phone number given

## 2015-11-11 NOTE — Interval H&P Note (Signed)
History and Physical Interval Note: No changes. Will proceed with TEE as planned.  11/11/2015 8:10 AM  Austin Holmes  has presented today for surgery, with the diagnosis of STROKE  The various methods of treatment have been discussed with the patient and family. After consideration of risks, benefits and other options for treatment, the patient has consented to  Procedure(s): TRANSESOPHAGEAL ECHOCARDIOGRAM (TEE) (N/A) as a surgical intervention .  The patient's history has been reviewed, patient examined, no change in status, stable for surgery.  I have reviewed the patient's chart and labs.  Questions were answered to the patient's satisfaction.     Kate Sable A

## 2015-11-11 NOTE — Discharge Instructions (Signed)

## 2015-11-11 NOTE — Procedures (Signed)
Transesophageal Echocardiogram: Indication: CVA Sedation: Versed: 5 mg, Fentanyl: 75 mcg.  Procedure:  The patient was moderately sedated with the above doses of versed and fentanyl.  Using digital technique an omniplane probe was advanced into the distal esophagus without incident. Transgastric imaging revealed normal LV function with no RWMA;s and no mural apical thrombus.  Estimated ejection fraction was 20-25% with diffuse hypokinesis.  Right sided cardiac chambers were normal with no evidence of pulmonary hypertension.  The pulmonary and tricuspid valves were structurally normal.  There was no significant TR The mitral valve was structurally normal with no significant mitral regurgitation.   The aortic valve was trileaflet with no significant stenosis or regurgitation. The aortic root was normal.    Imaging of the septum showed no ASD or VSD Bubble study was negative for shunt 2D and color flow confirmed no PFO  The LAE was well visualized in orthogonal views.  There was no spontaneous contrast and no thrombus.    The descending thoracic aorta had no mural aortic debris with no evidence of aneurysmal dilation or disection  Impression:  1) Severely reduced LV systolic function, EF 0000000, with diffuse hypokinesis. 2) No evidence for left ventricular apical nor left atrial appendage thrombus.  Kate Sable A 11/11/2015 10:11 AM

## 2015-11-11 NOTE — Progress Notes (Signed)
  Echocardiogram Echocardiogram Transesophageal has been performed.  Donata Clay 11/11/2015, 9:39 AM

## 2015-11-12 ENCOUNTER — Telehealth: Payer: Self-pay | Admitting: Physician Assistant

## 2015-11-12 NOTE — Telephone Encounter (Signed)
New Message:  Pt is calling back to get the results to his Stress Echo. Please f/u with him

## 2015-11-12 NOTE — Telephone Encounter (Signed)
Pt made aware of his lab results. Pt verbalized understanding.

## 2015-11-15 ENCOUNTER — Ambulatory Visit (HOSPITAL_COMMUNITY)
Admission: RE | Admit: 2015-11-15 | Discharge: 2015-11-15 | Disposition: A | Discharge status: Home / Self Care | Payer: Self-pay | Source: Ambulatory Visit | Attending: Oncology | Admitting: Oncology

## 2015-11-15 ENCOUNTER — Telehealth: Payer: Self-pay | Admitting: Oncology

## 2015-11-15 ENCOUNTER — Other Ambulatory Visit: Payer: Self-pay | Admitting: Oncology

## 2015-11-15 ENCOUNTER — Encounter (HOSPITAL_COMMUNITY): Payer: Self-pay

## 2015-11-15 DIAGNOSIS — R911 Solitary pulmonary nodule: Secondary | ICD-10-CM | POA: Insufficient documentation

## 2015-11-15 DIAGNOSIS — C499 Malignant neoplasm of connective and soft tissue, unspecified: Secondary | ICD-10-CM | POA: Insufficient documentation

## 2015-11-15 MED ORDER — IOPAMIDOL (ISOVUE-300) INJECTION 61%
100.0000 mL | Freq: Once | INTRAVENOUS | Status: AC | PRN
  Administered 2015-11-15: 100 mL via INTRAVENOUS

## 2015-11-15 NOTE — Telephone Encounter (Signed)
s.w. pt mother and advise done 5.5 appt...ok and aware

## 2015-11-16 ENCOUNTER — Ambulatory Visit: Payer: Self-pay | Admitting: General Surgery

## 2015-11-16 ENCOUNTER — Other Ambulatory Visit: Payer: Self-pay | Admitting: Radiology

## 2015-11-17 ENCOUNTER — Ambulatory Visit (HOSPITAL_COMMUNITY)
Admission: RE | Admit: 2015-11-17 | Discharge: 2015-11-17 | Disposition: A | Discharge status: Home / Self Care | Payer: Self-pay | Source: Ambulatory Visit | Attending: Oncology | Admitting: Oncology

## 2015-11-17 ENCOUNTER — Encounter (HOSPITAL_COMMUNITY): Payer: Self-pay

## 2015-11-17 DIAGNOSIS — C499 Malignant neoplasm of connective and soft tissue, unspecified: Secondary | ICD-10-CM | POA: Insufficient documentation

## 2015-11-17 DIAGNOSIS — Z8673 Personal history of transient ischemic attack (TIA), and cerebral infarction without residual deficits: Secondary | ICD-10-CM | POA: Insufficient documentation

## 2015-11-17 DIAGNOSIS — I429 Cardiomyopathy, unspecified: Secondary | ICD-10-CM | POA: Insufficient documentation

## 2015-11-17 DIAGNOSIS — Z79899 Other long term (current) drug therapy: Secondary | ICD-10-CM | POA: Insufficient documentation

## 2015-11-17 DIAGNOSIS — Z7982 Long term (current) use of aspirin: Secondary | ICD-10-CM | POA: Insufficient documentation

## 2015-11-17 DIAGNOSIS — Z833 Family history of diabetes mellitus: Secondary | ICD-10-CM | POA: Insufficient documentation

## 2015-11-17 DIAGNOSIS — Z808 Family history of malignant neoplasm of other organs or systems: Secondary | ICD-10-CM | POA: Insufficient documentation

## 2015-11-17 LAB — CBC WITH DIFFERENTIAL/PLATELET
Basophils Absolute: 0 10*3/uL (ref 0.0–0.1)
Basophils Relative: 1 %
Eosinophils Absolute: 0 10*3/uL (ref 0.0–0.7)
Eosinophils Relative: 1 %
HCT: 42.2 % (ref 39.0–52.0)
Hemoglobin: 14.4 g/dL (ref 13.0–17.0)
Lymphocytes Relative: 28 %
Lymphs Abs: 1.1 10*3/uL (ref 0.7–4.0)
MCH: 28.2 pg (ref 26.0–34.0)
MCHC: 34.1 g/dL (ref 30.0–36.0)
MCV: 82.7 fL (ref 78.0–100.0)
Monocytes Absolute: 0.4 10*3/uL (ref 0.1–1.0)
Monocytes Relative: 10 %
Neutro Abs: 2.4 10*3/uL (ref 1.7–7.7)
Neutrophils Relative %: 60 %
Platelets: 255 10*3/uL (ref 150–400)
RBC: 5.1 MIL/uL (ref 4.22–5.81)
RDW: 14.2 % (ref 11.5–15.5)
WBC: 3.9 10*3/uL — ABNORMAL LOW (ref 4.0–10.5)

## 2015-11-17 LAB — PROTIME-INR
INR: 1.11 (ref 0.00–1.49)
Prothrombin Time: 14.1 seconds (ref 11.6–15.2)

## 2015-11-17 MED ORDER — SODIUM CHLORIDE 0.9 % IV SOLN
INTRAVENOUS | Status: DC
  Administered 2015-11-17: 11:00:00 via INTRAVENOUS

## 2015-11-17 MED ORDER — MIDAZOLAM HCL 2 MG/2ML IJ SOLN
INTRAMUSCULAR | Status: AC | PRN
  Administered 2015-11-17 (×2): 1 mg via INTRAVENOUS
  Administered 2015-11-17: 2 mg via INTRAVENOUS
  Administered 2015-11-17 (×2): 1 mg via INTRAVENOUS

## 2015-11-17 MED ORDER — FENTANYL CITRATE (PF) 100 MCG/2ML IJ SOLN
INTRAMUSCULAR | Status: AC | PRN
  Administered 2015-11-17: 50 ug via INTRAVENOUS
  Administered 2015-11-17: 25 ug via INTRAVENOUS

## 2015-11-17 MED ORDER — MIDAZOLAM HCL 2 MG/2ML IJ SOLN
INTRAMUSCULAR | Status: AC
  Filled 2015-11-17: qty 6

## 2015-11-17 MED ORDER — FENTANYL CITRATE (PF) 100 MCG/2ML IJ SOLN
INTRAMUSCULAR | Status: AC
  Filled 2015-11-17: qty 4

## 2015-11-17 NOTE — Procedures (Signed)
Interventional Radiology Procedure Note  Procedure: US guided core biopsy of left thigh mass.  18 and 16G cores obtained.   Complications: None  Estimated Blood Loss: 0  Recommendations: - Bedrest x 1 hr - Path pending  Signed,  Criselda Peaches, MD

## 2015-11-17 NOTE — Discharge Instructions (Signed)
Needle Biopsy, Care After °These instructions give you information about caring for yourself after your procedure. Your doctor may also give you more specific instructions. Call your doctor if you have any problems or questions after your procedure. °HOME CARE °· Rest as told by your doctor. °· Take medicines only as told by your doctor. °· There are many different ways to close and cover the biopsy site, including stitches (sutures), skin glue, and adhesive strips. Follow instructions from your doctor about: °¨ How to take care of your biopsy site. °¨ When and how you should change your bandage (dressing). °¨ When you should remove your dressing. °¨ Removing whatever was used to close your biopsy site. °· Check your biopsy site every day for signs of infection. Watch for: °¨ Redness, swelling, or pain. °¨ Fluid, blood, or pus. °GET HELP IF: °· You have a fever. °· You have redness, swelling, or pain at the biopsy site, and it lasts longer than a few days. °· You have fluid, blood, or pus coming from the biopsy site. °· You feel sick to your stomach (nauseous). °· You throw up (vomit). °GET HELP RIGHT AWAY IF: °· You are short of breath. °· You have trouble breathing. °· Your chest hurts. °· You feel dizzy or you pass out (faint). °· You have bleeding that does not stop with pressure or a bandage. °· You cough up blood. °· Your belly (abdomen) hurts. °  °This information is not intended to replace advice given to you by your health care provider. Make sure you discuss any questions you have with your health care provider. °  °Document Released: 06/29/2008 Document Revised: 12/01/2014 Document Reviewed: 07/13/2014 °[employer] Interactive Patient Education ©2016 [employer] ° ° ° °Moderate Conscious Sedation, Adult °Sedation is the use of medicines to promote relaxation and relieve discomfort and anxiety. Moderate conscious sedation is a type of sedation. Under moderate conscious sedation you are less alert than  normal but are still able to respond to instructions or stimulation. Moderate conscious sedation is used during short medical and dental procedures. It is milder than deep sedation or general anesthesia and allows you to return to your regular activities sooner. °LET YOUR HEALTH CARE PROVIDER KNOW ABOUT:  °· Any allergies you have. °· All medicines you are taking, including vitamins, herbs, eye drops, creams, and over-the-counter medicines. °· Use of steroids (by mouth or creams). °· Previous problems you or members of your family have had with the use of anesthetics. °· Any blood disorders you have. °· Previous surgeries you have had. °· Medical conditions you have. °· Possibility of pregnancy, if this applies. °· Use of cigarettes, alcohol, or illegal drugs. °RISKS AND COMPLICATIONS °Generally, this is a safe procedure. However, as with any procedure, problems can occur. Possible problems include: °· Oversedation. °· Trouble breathing on your own. You may need to have a breathing tube until you are awake and breathing on your own. °· Allergic reaction to any of the medicines used for the procedure. °BEFORE THE PROCEDURE °· You may have blood tests done. These tests can help show how well your kidneys and liver are working. They can also show how well your blood clots. °· A physical exam will be done.   °· Only take medicines as directed by your health care provider. You may need to stop taking medicines (such as blood thinners, aspirin, or nonsteroidal anti-inflammatory drugs) before the procedure.   °· Do not eat or drink at least 6 hours before the procedure or   as directed by your health care provider. °· Arrange for a responsible adult, family member, or friend to take you home after the procedure. He or she should stay with you for at least 24 hours after the procedure, until the medicine has worn off. °PROCEDURE  °· An intravenous (IV) catheter will be inserted into one of your veins. Medicine will be able to  flow directly into your body through this catheter. You may be given medicine through this tube to help prevent pain and help you relax. °· The medical or dental procedure will be done. °AFTER THE PROCEDURE °· You will stay in a recovery area until the medicine has worn off. Your blood pressure and pulse will be checked.   °·  Depending on the procedure you had, you may be allowed to go home when you can tolerate liquids and your pain is under control. °  °This information is not intended to replace advice given to you by your health care provider. Make sure you discuss any questions you have with your health care provider. °  °Document Released: 04/11/2001 Document Revised: 08/07/2014 Document Reviewed: 03/24/2013 °[employer] Interactive Patient Education ©2016 [employer] ° °

## 2015-11-17 NOTE — H&P (Signed)
Chief Complaint: Patient was seen in consultation today for image guided biopsy of left medial thigh mass  Referring Physician(s): Chauncey Cruel  Supervising Physician: Jacqulynn Cadet  History of Present Illness: Austin Holmes is a 45 y.o. 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 MRI of the left lower extremity 11/06/15 revealed a well-circumscribed 9.2 cm mass just posterior to the left sartorius muscle adjacent to the neurovascular bundle. 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 He presents today for image guided biopsy of this left medial thigh mass for further evaluation.  Past Medical History  Diagnosis Date  . Stroke [facility])     Past Surgical History  Procedure Laterality Date  . None    . Tee without cardioversion N/A 11/11/2015    Procedure: TRANSESOPHAGEAL ECHOCARDIOGRAM (TEE);  Surgeon: Herminio Commons, MD;  Location: [facility] ENDOSCOPY;  Service: Cardiovascular;  Laterality: N/A;    Allergies: Review of patient's allergies indicates no known allergies.  Medications: Prior to Admission medications   Medication Sig Start Date End Date Taking? Authorizing Provider  aspirin EC 325 MG EC tablet Take 1 tablet (325 mg total) by mouth daily. 11/09/15  Yes Ivan Anchors Love, PA-C  atorvastatin (LIPITOR) 40 MG tablet Take 1 tablet (40 mg total) by mouth daily at 6 PM. 11/09/15  Yes Ivan Anchors Love, PA-C  losartan (COZAAR) 25 MG tablet Take 1 tablet (25 mg total) by mouth daily. 11/09/15  Yes Ivan Anchors Love, PA-C  metoprolol succinate (TOPROL XL) 25 MG 24 hr tablet Take 1 tablet (25 mg total) by mouth daily. 11/09/15  Yes Bary Leriche, PA-C     Family History  Problem Relation Age of Onset  . Diabetes Mellitus II  Maternal Grandmother   . Diabetes Mellitus II Maternal Grandfather   . Cancer Father     pancreatic    Social History   Social History  . Marital Status: Single    Spouse Name: N/A  . Number of Children: N/A  . 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 Smoking status: Never Smoker   . Smokeless tobacco: None  . Alcohol Use: 0.0 oz/week    0 Standard drinks or equivalent per week     Comment: occasionally drinks beer  . Drug Use: No  . Sexual Activity: Not Asked   Other Topics Concern  . None   Social History Narrative     Review of Systems  Constitutional: Negative for fever and chills.  Respiratory: Negative for cough and shortness of breath.   Cardiovascular: Negative for chest pain.  Gastrointestinal: Negative for nausea, vomiting, abdominal pain and blood in stool.  Genitourinary: Negative for dysuria and hematuria.  Musculoskeletal: Negative for back pain.  Neurological: Negative for dizziness, facial asymmetry, speech difficulty, weakness, numbness and headaches.    Vital Signs: BP 128/88 mmHg  Pulse 72  Temp(Src) 98.3 F (36.8 C) (Oral)  Resp 18  SpO2 100%  Physical Exam  Constitutional: He is oriented to person, place, and time. He appears well-developed and well-nourished.  Cardiovascular: Normal rate and regular rhythm.   Pulmonary/Chest: Effort normal and breath sounds normal.  Abdominal: Soft. Bowel sounds are normal. There is no tenderness.  Musculoskeletal:  He exhibits no edema.  Large palpable NT left upper medial thigh mass  Neurological: He is alert and oriented to person, place, and time.    Mallampati Score:     Imaging: Ct Angio Head W/cm &/or Wo Cm  11/01/2015  ADDENDUM REPORT: 11/01/2015 20:36 ADDENDUM: Study discussed by telephone with Dr. Doren Custard STAFFORD on 11/01/2015 at 2030 hours. Electronically Signed   By: Genevie Ann M.D.   On: 11/01/2015 20:36  11/01/2015  CLINICAL  DATA:  45 year old male with unsteady gait today, blurred vision, right lower extremity paresthesia. Initial encounter. EXAM: CT ANGIOGRAPHY HEAD AND NECK TECHNIQUE: Multidetector CT imaging of the head and neck was performed using the standard protocol during bolus administration of intravenous contrast. Multiplanar CT image reconstructions and MIPs were obtained to evaluate the vascular anatomy. Carotid stenosis measurements (when applicable) are obtained utilizing NASCET criteria, using the distal internal carotid diameter as the denominator. CONTRAST:  75 mL Isovue 370 COMPARISON:  Head CT without contrast 1636 hours today. FINDINGS: CTA NECK Skeleton:  No acute osseous abnormality identified. Mild paranasal sinus mucosal thickening. Sphenoid sinuses are spared. Tympanic cavities and mastoids are clear. Other neck: Negative lung apices. No superior mediastinal lymphadenopathy. Thyroid, larynx, pharynx, parapharyngeal spaces, retropharyngeal space, sublingual space, submandibular glands, and parotid glands are within normal limits. No cervical lymphadenopathy. Aortic arch: 3 vessel arch configuration. No arch atherosclerosis or great vessel origin stenosis. Right carotid system: Negative. Left carotid system: Negative. Vertebral arteries:Negative; no proximal subclavian artery stenosis, normal vertebral artery origins and no vertebral artery stenosis in the neck. The left vertebral artery is non dominant. CTA HEAD Posterior circulation: Mildly dominant distal right vertebral artery. Normal PICA origins. No distal vertebral artery stenosis. Patent vertebrobasilar junction. No basilar stenosis. Normal SCA and PCA origins. Both posterior communicating arteries are present, the left is diminutive. There is a 6 mm segment of occlusion in the distal left PCA P2 segment. There is reconstituted flow distally. This is best seen on series 11, image 21. Right PCA branches are within normal limits. Anterior circulation: Both  ICA siphons are patent. Both ophthalmic and posterior communicating artery origins are within normal limits. Both carotid termini are patent. The left ACA is non dominant. Anterior communicating artery and ACA branches are within normal limits. Left MCA M1 segment, bifurcation, and left MCA branches are within normal limits. Right MCA M1 segment, bifurcation, and right MCA branches are within normal limits. Venous sinuses: Patent. Anatomic variants: Non dominant left vertebral artery and left ACA. Delayed phase: No abnormal enhancement identified. No cortically based acute infarct identified. No intracranial hemorrhage or mass effect. IMPRESSION: 1. Negative for emergent large vessel occlusion but Positive for segmental Occlusion vs. High-grade Stenosis in the Left PCA P2 segment. There is reconstituted distal left PCA flow. See series 11, image 21. 2. Stable and negative CT appearance of the brain. 3. No atherosclerosis or stenosis in the neck. Negative anterior circulation. Electronically Signed: By: Genevie Ann M.D. On: 11/01/2015 20:27   Ct Head Wo Contrast  11/01/2015  CLINICAL DATA:  Right leg numbness since yesterday EXAM: CT HEAD WITHOUT CONTRAST TECHNIQUE: Contiguous axial images were obtained from the base of the skull through the vertex without intravenous contrast. COMPARISON:  None. FINDINGS: No acute intracranial abnormality. Specifically, no hemorrhage, hydrocephalus, mass lesion, acute infarction, or significant intracranial injury. No acute calvarial abnormality. Visualized paranasal sinuses and mastoids clear. Orbital soft tissues unremarkable. IMPRESSION: No acute intracranial abnormality. Electronically Signed   By: Rolm Baptise M.D.  On: 11/01/2015 16:40   Ct Angio Neck W/cm &/or Wo/cm  11/01/2015  ADDENDUM REPORT: 11/01/2015 20:36 ADDENDUM: Study discussed by telephone with Dr. Doren Custard STAFFORD on 11/01/2015 at 2030 hours. Electronically Signed   By: Genevie Ann M.D.   On: 11/01/2015  20:36  11/01/2015  CLINICAL DATA:  45 year old male with unsteady gait today, blurred vision, right lower extremity paresthesia. Initial encounter. EXAM: CT ANGIOGRAPHY HEAD AND NECK TECHNIQUE: Multidetector CT imaging of the head and neck was performed using the standard protocol during bolus administration of intravenous contrast. Multiplanar CT image reconstructions and MIPs were obtained to evaluate the vascular anatomy. Carotid stenosis measurements (when applicable) are obtained utilizing NASCET criteria, using the distal internal carotid diameter as the denominator. CONTRAST:  75 mL Isovue 370 COMPARISON:  Head CT without contrast 1636 hours today. FINDINGS: CTA NECK Skeleton:  No acute osseous abnormality identified. Mild paranasal sinus mucosal thickening. Sphenoid sinuses are spared. Tympanic cavities and mastoids are clear. Other neck: Negative lung apices. No superior mediastinal lymphadenopathy. Thyroid, larynx, pharynx, parapharyngeal spaces, retropharyngeal space, sublingual space, submandibular glands, and parotid glands are within normal limits. No cervical lymphadenopathy. Aortic arch: 3 vessel arch configuration. No arch atherosclerosis or great vessel origin stenosis. Right carotid system: Negative. Left carotid system: Negative. Vertebral arteries:Negative; no proximal subclavian artery stenosis, normal vertebral artery origins and no vertebral artery stenosis in the neck. The left vertebral artery is non dominant. CTA HEAD Posterior circulation: Mildly dominant distal right vertebral artery. Normal PICA origins. No distal vertebral artery stenosis. Patent vertebrobasilar junction. No basilar stenosis. Normal SCA and PCA origins. Both posterior communicating arteries are present, the left is diminutive. There is a 6 mm segment of occlusion in the distal left PCA P2 segment. There is reconstituted flow distally. This is best seen on series 11, image 21. Right PCA branches are within normal  limits. Anterior circulation: Both ICA siphons are patent. Both ophthalmic and posterior communicating artery origins are within normal limits. Both carotid termini are patent. The left ACA is non dominant. Anterior communicating artery and ACA branches are within normal limits. Left MCA M1 segment, bifurcation, and left MCA branches are within normal limits. Right MCA M1 segment, bifurcation, and right MCA branches are within normal limits. Venous sinuses: Patent. Anatomic variants: Non dominant left vertebral artery and left ACA. Delayed phase: No abnormal enhancement identified. No cortically based acute infarct identified. No intracranial hemorrhage or mass effect. IMPRESSION: 1. Negative for emergent large vessel occlusion but Positive for segmental Occlusion vs. High-grade Stenosis in the Left PCA P2 segment. There is reconstituted distal left PCA flow. See series 11, image 21. 2. Stable and negative CT appearance of the brain. 3. No atherosclerosis or stenosis in the neck. Negative anterior circulation. Electronically Signed: By: Genevie Ann M.D. On: 11/01/2015 20:27   Ct Chest W Contrast  11/15/2015  CLINICAL DATA:  Myxoid liposarcoma of the left thigh EXAM: CT CHEST, ABDOMEN AND PELVIS WITHOUT CONTRAST TECHNIQUE: Multidetector CT imaging of the chest, abdomen and pelvis was performed following the standard protocol without IV contrast. COMPARISON:  None. FINDINGS: CT CHEST FINDINGS Mediastinum/Lymph Nodes: There is no axillary lymphadenopathy. No mediastinal lymphadenopathy. There is no hilar lymphadenopathy. Cardiopericardial silhouette is at upper limits of normal for size. No pericardial effusion. The esophagus has normal imaging features. Lungs/Pleura: 3 mm left upper lobe nodule is seen on image 35. No other pulmonary nodule or mass is evident. No focal airspace consolidation. No pulmonary edema or pleural effusion. Musculoskeletal: Bone windows reveal  no worrisome lytic or sclerotic osseous lesions. CT  ABDOMEN PELVIS FINDINGS Hepatobiliary: No focal abnormality within the liver parenchyma. There is no evidence for gallstones, gallbladder wall thickening, or pericholecystic fluid. No intrahepatic or extrahepatic biliary dilation. Pancreas: No focal mass lesion. No dilatation of the main duct. No intraparenchymal cyst. No peripancreatic edema. Spleen: No splenomegaly. No focal mass lesion. Adrenals/Urinary Tract: No adrenal nodule or mass. 16 mm cyst identified upper pole right kidney. Left kidney unremarkable. No evidence for hydroureter. The urinary bladder appears normal for the degree of distention. Stomach/Bowel: Stomach is nondistended. No gastric wall thickening. No evidence of outlet obstruction. Duodenum is normally positioned as is the ligament of Treitz. No small bowel wall thickening. No small bowel dilatation. The terminal ileum is normal. The appendix is normal. No gross colonic mass. No colonic wall thickening. No substantial diverticular change. Vascular/Lymphatic: No abdominal aortic aneurysm. No abdominal aortic atherosclerotic calcification. There is no gastrohepatic or hepatoduodenal ligament lymphadenopathy. No intraperitoneal or retroperitoneal lymphadenopathy. No pelvic sidewall lymphadenopathy. Reproductive: The prostate gland and seminal vesicles have normal imaging features. Other: No intraperitoneal free fluid. Musculoskeletal: Bone windows reveal no worrisome lytic or sclerotic osseous lesions. IMPRESSION: 1. 3 mm left upper lobe pulmonary nodule. This is unlikely to represent metastatic disease, but close attention on follow-up imaging recommended. 2. Otherwise no evidence for metastatic disease in the chest, abdomen, or pelvis. Electronically Signed   By: Misty Stanley M.D.   On: 11/15/2015 13:26   Mr Brain Wo Contrast  11/02/2015  CLINICAL DATA:  45 year old male with un steady gait, blurred vision right lower extremity paresthesias. Subsequent encounter. EXAM: MRI HEAD WITHOUT  CONTRAST MRA HEAD WITHOUT CONTRAST TECHNIQUE: Multiplanar, multiecho pulse sequences of the brain and surrounding structures were obtained without intravenous contrast. Angiographic images of the head were obtained using MRA technique without contrast. COMPARISON:  CT angiogram 11/01/2015. 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 No intracranial hemorrhage. Very mild periventricular white matter changes may be related to result of small vessel disease. No age advanced atrophy or hydrocephalus. No intracranial mass lesion noted on this unenhanced exam. Decreased signal intensity of bone marrow (most notable upper cervical spine) may be related to patient's habitus. Correlation with CBC to exclude anemia contributing to this appearance may be considered Cervical medullary junction, pituitary region, pineal region and orbital structures unremarkable. Junction Mild paranasal sinus mucosal thickening. MRA HEAD FINDINGS Narrowing of the cavernous segment of the internal carotid artery greater on left. This is felt to represent overestimation of degree of narrowing given the appearance on recent CT angiogram. Mild irregularity M1 segment middle cerebral artery bilaterally. Minimal bulge superior margin felt to be origin of a vessel rather than aneurysm. Moderate narrowing M2 segment /middle cerebral artery branches bilaterally. Mild to moderate narrowing A1 segment left anterior cerebral artery. Moderate to marked narrowing A2 segment left anterior cerebral artery. Mild irregularity with minimal to mild narrowing portions of right anterior cerebral artery. Moderate narrowing distal vertebral arteries more notable on the left. Nonvisualized posterior inferior cerebellar arteries and anterior inferior cerebellar arteries bilaterally. Mild to slightly moderate narrowing and irregularity basilar artery. Occlusion of the left posterior cerebral artery just beyond P1  segment. Mild to moderate narrowing of portions of the right posterior cerebral artery. Moderate narrowing superior cerebellar artery bilaterally. 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 Decreased signal intensity of bone marrow (most notable upper cervical spine) may be related to patient's  habitus. Correlation with CBC to exclude anemia contributing to this appearance may be considered MRA HEAD MR angiogram appears to overestimate degree of narrowing given the appearance on recent CT angiogram as detailed above. 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 Electronically Signed   By: Genia Del M.D.   On: 11/02/2015 11:54   Ct Abdomen Pelvis W Contrast  11/15/2015  CLINICAL DATA:  Myxoid liposarcoma of the left thigh EXAM: CT CHEST, ABDOMEN AND PELVIS WITHOUT CONTRAST TECHNIQUE: Multidetector CT imaging of the chest, abdomen and pelvis was performed following the standard protocol without IV contrast. COMPARISON:  None. FINDINGS: CT CHEST FINDINGS Mediastinum/Lymph Nodes: There is no axillary lymphadenopathy. No mediastinal lymphadenopathy. There is no hilar lymphadenopathy. Cardiopericardial silhouette is at upper limits of normal for size. No pericardial effusion. The esophagus has normal imaging features. Lungs/Pleura: 3 mm left upper lobe nodule is seen on image 35. No other pulmonary nodule or mass is evident. No focal airspace consolidation. No pulmonary edema or pleural effusion. Musculoskeletal: Bone windows reveal no worrisome lytic or sclerotic osseous lesions. CT ABDOMEN PELVIS FINDINGS Hepatobiliary: No focal abnormality within the liver parenchyma. There is no evidence for gallstones, gallbladder wall thickening, or pericholecystic fluid. No intrahepatic or extrahepatic biliary dilation. Pancreas: No focal mass lesion. No dilatation of the main duct. No intraparenchymal cyst. No peripancreatic edema. Spleen: No  splenomegaly. No focal mass lesion. Adrenals/Urinary Tract: No adrenal nodule or mass. 16 mm cyst identified upper pole right kidney. Left kidney unremarkable. No evidence for hydroureter. The urinary bladder appears normal for the degree of distention. Stomach/Bowel: Stomach is nondistended. No gastric wall thickening. No evidence of outlet obstruction. Duodenum is normally positioned as is the ligament of Treitz. No small bowel wall thickening. No small bowel dilatation. The terminal ileum is normal. The appendix is normal. No gross colonic mass. No colonic wall thickening. No substantial diverticular change. Vascular/Lymphatic: No abdominal aortic aneurysm. No abdominal aortic atherosclerotic calcification. There is no gastrohepatic or hepatoduodenal ligament lymphadenopathy. No intraperitoneal or retroperitoneal lymphadenopathy. No pelvic sidewall lymphadenopathy. Reproductive: The prostate gland and seminal vesicles have normal imaging features. Other: No intraperitoneal free fluid. Musculoskeletal: Bone windows reveal no worrisome lytic or sclerotic osseous lesions. IMPRESSION: 1. 3 mm left upper lobe pulmonary nodule. This is unlikely to represent metastatic disease, but close attention on follow-up imaging recommended. 2. Otherwise no evidence for metastatic disease in the chest, abdomen, or pelvis. Electronically Signed   By: Misty Stanley M.D.   On: 11/15/2015 13:26   Mr Femur Left W Wo Contrast  11/06/2015  CLINICAL DATA:  Left lower extremity, medial left thigh mass EXAM: MR OF THE LEFT LOWER EXTREMITY WITHOUT AND WITH CONTRAST TECHNIQUE: Multiplanar, multisequence MR imaging of the lower left extremity was performed both before and after administration of intravenous contrast. CONTRAST:  69mL MULTIHANCE GADOBENATE DIMEGLUMINE 529 MG/ML IV SOLN COMPARISON:  None. FINDINGS: No acute fracture, dislocation or avascular necrosis. No periosteal reaction or bone destruction. Mild linear T2 hyperintense  signal in the right femoral neck likely reflecting red marrow. Well-circumscribed 7.1 x 7.3 x 9.2 cm nonenhancing mass just posterior to the left sartorius muscle adjacent to the neurovascular bundle. The mass is predominantly T2 hyperintense with interspersed areas of T1 hyperintensity consistent with fat. No other soft tissue mass. Muscles are normal in signal. No muscle edema or atrophy. No other fluid collection or hematoma. IMPRESSION: 1. Well-circumscribed 7.1 x 7.3 x 9.2 cm nonenhancing mass just posterior to the left sartorius muscle  adjacent to the neurovascular bundle. The mass is predominantly T2 hyperintense with interspersed areas of T1 hyperintensity consistent with fat. 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 Electronically Signed   By: Kathreen Devoid   On: 11/06/2015 10:57   Mr Jodene Nam Head/brain Wo Cm  11/02/2015  CLINICAL DATA:  45 year old male with un steady gait, blurred vision right lower extremity paresthesias. Subsequent encounter. EXAM: MRI HEAD WITHOUT CONTRAST MRA HEAD WITHOUT CONTRAST TECHNIQUE: Multiplanar, multiecho pulse sequences of the brain and surrounding structures were obtained without intravenous contrast. Angiographic images of the head were obtained using MRA technique without contrast. COMPARISON:  CT angiogram 11/01/2015. 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 No intracranial hemorrhage. Very mild periventricular white matter changes may be related to result of small vessel disease. No age advanced atrophy or hydrocephalus. No intracranial mass lesion noted on this unenhanced exam. Decreased signal intensity of bone marrow (most notable upper cervical spine) may be related to patient's habitus. Correlation with CBC to exclude anemia contributing to this appearance may be considered Cervical medullary junction, pituitary region, pineal region and orbital structures unremarkable. Junction Mild  paranasal sinus mucosal thickening. MRA HEAD FINDINGS Narrowing of the cavernous segment of the internal carotid artery greater on left. This is felt to represent overestimation of degree of narrowing given the appearance on recent CT angiogram. Mild irregularity M1 segment middle cerebral artery bilaterally. Minimal bulge superior margin felt to be origin of a vessel rather than aneurysm. Moderate narrowing M2 segment /middle cerebral artery branches bilaterally. Mild to moderate narrowing A1 segment left anterior cerebral artery. Moderate to marked narrowing A2 segment left anterior cerebral artery. Mild irregularity with minimal to mild narrowing portions of right anterior cerebral artery. Moderate narrowing distal vertebral arteries more notable on the left. Nonvisualized posterior inferior cerebellar arteries and anterior inferior cerebellar arteries bilaterally. Mild to slightly moderate narrowing and irregularity basilar artery. 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 Mild to moderate narrowing of portions of the right posterior cerebral artery. Moderate narrowing superior cerebellar artery bilaterally. 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 Decreased signal intensity of bone marrow (most notable upper cervical spine) may be related to patient's habitus. Correlation with CBC to exclude anemia contributing to this appearance may be considered MRA HEAD MR angiogram appears to overestimate degree of narrowing given the appearance on recent CT angiogram as detailed above. 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 Electronically Signed   By: Genia Del M.D.   On: 11/02/2015 11:54    Labs:  CBC:  Recent Labs  11/01/15 1853 11/02/15 0557 11/04/15 0514  WBC 4.6 3.9 3.5*  HGB 13.8 14.0 13.9  HCT 41.2 41.5 42.2  PLT 146* 147* 142*    COAGS: No results for input(s): INR, APTT in the  last 8760 hours.  BMP:  Recent Labs  11/01/15 1853 11/02/15 0557 11/04/15 0514  NA 135  --  139  K 3.5  --  3.8  CL 102  --  103  CO2 24  --  25  GLUCOSE 102*  --  93  BUN 21*  --  10  CALCIUM 9.5  --  9.5  CREATININE 1.21 1.05 1.23  GFRNONAA >60 >60 >60  GFRAA >60 >60 >60    LIVER FUNCTION TESTS:  Recent Labs  11/04/15 0514  BILITOT 1.0  AST 40  ALT 42  ALKPHOS  32*  PROT 7.1  ALBUMIN 3.6    TUMOR MARKERS: No results for input(s): AFPTM, CEA, CA199, CHROMGRNA in the last 8760 hours.  Assessment and Plan:  45 y.o. 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 MRI of the left lower extremity 11/06/15 revealed a well-circumscribed 9.2 cm mass just posterior to the left sartorius muscle adjacent to the neurovascular bundle. 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 He presents today for image guided biopsy of this left medial thigh mass for further evaluation.Risks and benefits discussed with the patient/mother including, but not limited to bleeding, infection, damage to adjacent structures or low yield requiring additional tests.All of the patient's questions were answered, patient is agreeable to proceed.Consent signed and in chart.      Thank you for this interesting consult.  I greatly enjoyed meeting Austin Holmes and look forward to participating in their care.  A copy of this report was sent to the requesting provider on this date.  Electronically Signed: D. Rowe Robert 11/17/2015, 11:26 AM   I spent a total of 20 minutes in face to face in clinical consultation, greater than 50% of which was counseling/coordinating care for image guided biopsy of left thigh mass

## 2015-11-18 ENCOUNTER — Other Ambulatory Visit: Payer: Self-pay | Admitting: Oncology

## 2015-11-18 ENCOUNTER — Ambulatory Visit (HOSPITAL_COMMUNITY): Payer: Self-pay

## 2015-11-19 ENCOUNTER — Other Ambulatory Visit: Payer: Self-pay | Admitting: Oncology

## 2015-11-22 ENCOUNTER — Encounter: Payer: Self-pay | Attending: Physical Medicine & Rehabilitation

## 2015-11-22 ENCOUNTER — Encounter: Payer: Self-pay | Admitting: Physical Medicine & Rehabilitation

## 2015-11-22 ENCOUNTER — Ambulatory Visit (HOSPITAL_BASED_OUTPATIENT_CLINIC_OR_DEPARTMENT_OTHER): Payer: Self-pay | Admitting: Physical Medicine & Rehabilitation

## 2015-11-22 VITALS — BP 134/89 | HR 81

## 2015-11-22 DIAGNOSIS — Z8673 Personal history of transient ischemic attack (TIA), and cerebral infarction without residual deficits: Secondary | ICD-10-CM

## 2015-11-22 DIAGNOSIS — Z833 Family history of diabetes mellitus: Secondary | ICD-10-CM | POA: Insufficient documentation

## 2015-11-22 DIAGNOSIS — I10 Essential (primary) hypertension: Secondary | ICD-10-CM | POA: Insufficient documentation

## 2015-11-22 DIAGNOSIS — Z800 Family history of malignant neoplasm of digestive organs: Secondary | ICD-10-CM | POA: Insufficient documentation

## 2015-11-22 NOTE — Patient Instructions (Signed)
You will have someone from the [facility] neurology clinic call you to set up an appointment with Dr. Leonie Man

## 2015-11-22 NOTE — Progress Notes (Signed)
Subjective:    Patient ID: Austin Holmes, male    DOB: 19-Feb-1971, 45 y.o.   MRN: [id number] y.o. male in relatively good health who was admitted on to [facility] on 11/01/2015 with HA, right sided weakness and progressive difficulty walking. 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 2D echo with severe reduction in systolic funciton with diffuse hypokinesis and EF 20-25% with trivial AVR. 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 Dr. Tyron Russell evaluated patient and recommended full strength ASA for stroke prevention and hypercoagulopathy work up HPI Patient returns today 2 week transition of care. He has seen cardiology according to his report and has had additional testing on his heart which looked okay. He does have a cardiology follow-up appointment tomorrow. He should also has oncology follow-up appointment next week. Has appointment with his primary care physician 01/05/2016  Patient is now independent with all his bathing and dressing, cooking, house work. Denies drinking alcohol Has not returned to work but would like to do so. Denies any visual complaints Pain Inventory Average Pain 0 Pain Right Now 0 My pain is no pain  In the last 24 hours, has pain interfered with the following? General activity 0 Relation with others 0 Enjoyment of life 0 What TIME of day is your pain at its worst? no pain Sleep (in general) NA  Pain is worse with: no pain Pain improves with: no pain Relief from Meds: no pain  Mobility walk without assistance how many minutes can you walk? hours ability to climb steps?  no do you drive?  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   no  Physicians involved in your care Any changes since last visit?  no   Family History  Problem Relation Age of Onset  . Diabetes Mellitus II Maternal Grandmother   . Diabetes Mellitus II Maternal Grandfather   . Cancer Father     pancreatic   Social History   Social History  . Marital Status: Single    Spouse Name: N/A  . Number of Children: N/A  . 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 Smoking status: Never Smoker   . Smokeless tobacco: None  . Alcohol Use: 0.0 oz/week    0 Standard drinks or equivalent per week     Comment: occasionally drinks beer  . Drug Use: No  . Sexual Activity: Not Asked   Other Topics Concern  . None   Social History Narrative   Past Surgical History  Procedure Laterality Date  . None    . Tee without cardioversion N/A 11/11/2015    Procedure: TRANSESOPHAGEAL ECHOCARDIOGRAM (TEE);  Surgeon: Herminio Commons, MD;  Location: [place];  Service: Cardiovascular;  Laterality: N/A;   Past Medical History  Diagnosis Date  . Stroke (HCC)    BP 134/89 mmHg  Pulse 81  SpO2 99%  Opioid Risk Score:   Fall Risk Score:  `1  Depression screen PHQ 2/9  Depression screen PHQ 2/9 11/22/2015  Decreased Interest 1  Down, Depressed, Hopeless 0  PHQ - 2 Score 1  Altered sleeping 0  Tired, decreased energy  0  Change in appetite 0  Feeling bad or failure about yourself  0  Trouble concentrating 0  Moving slowly or fidgety/restless 0  Suicidal thoughts 0  PHQ-9 Score 1     Review of Systems  All other systems reviewed and are negative.      Objective:   Physical Exam  Constitutional: He is oriented to person, place, and time. He appears well-developed and well-nourished.  HENT:  Head: Normocephalic and atraumatic.  Eyes: Conjunctivae and EOM are normal. Pupils are equal, round, and reactive to light.  Visual fields are intact confrontation testing  Neck: Normal  range of motion. Neck supple.  Pulmonary/Chest: Effort normal.  Abdominal: He exhibits no distension.  Neurological: He is alert and oriented to person, place, and time. He has normal strength. He displays no atrophy and no tremor. Coordination normal.  Motor strength is 5/5 bilateral deltoid, biceps, triceps, grip, hip flexor, knee extensor, ankle dorsi flexors and plantar flexor  Finger-nose-finger intact   Skin: Skin is warm and dry.  Psychiatric: He has a normal mood and affect.  Nursing note and vitals reviewed.         Assessment & Plan:   1. Left thalamic and temporal infarct with improvements in his neurologic status. He is now independent with all his self-care and mobility, has no significant visual complaints. No other significant abnormalities on his neuro examination at the current time. He should follow-up with neurology to go over secondary stroke prevention-We will make referral   Continue aspirin325 mg and Lipitor for now  2. Hypertension continue Cozaar, if he runs out before his appointment with primary care on 01/05/2016 he can call this office  3. Cardiomyopathy question alcohol-related, will follow up with cardiology On 11/26/2015  He should obtain clearance For return to work from his cardiologist.  Also he will need see what the plan is for his thigh mass.  From a stroke standpoint he is doing well Functionally and will not need any physical medicine and rehabilitation follow-up 4. 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

## 2015-11-23 ENCOUNTER — Ambulatory Visit (HOSPITAL_BASED_OUTPATIENT_CLINIC_OR_DEPARTMENT_OTHER): Payer: Self-pay | Admitting: General Surgery

## 2015-11-23 ENCOUNTER — Encounter: Payer: Self-pay | Admitting: General Surgery

## 2015-11-23 VITALS — HR 86 | Temp 98.4°F | Resp 20 | Ht 73.0 in | Wt 240.4 lb

## 2015-11-23 DIAGNOSIS — D492 Neoplasm of unspecified behavior of bone, soft tissue, and skin: Secondary | ICD-10-CM

## 2015-11-23 DIAGNOSIS — R2242 Localized swelling, mass and lump, left lower limb: Secondary | ICD-10-CM

## 2015-11-23 NOTE — Patient Instructions (Signed)
Plan to follow up with cardiology and see [place] in the office on May 9.  Please call [phone] for any questions or concerns.

## 2015-11-23 NOTE — Progress Notes (Signed)
[facility] NOTE  Patient Care Team: Jettie Booze, MD as Consulting Physician (Cardiology) Chauncey Cruel, MD as Consulting Physician (Oncology) Charlett Blake, MD as Consulting Physician (Physical Medicine and Rehabilitation)  CHIEF COMPLAINTS/PURPOSE OF CONSULTATION: Newly diagnosed left thigh atypical lipomatous tumor.   HISTORY OF PRESENTING ILLNESS:  Austin Holmes 45 y.o. male is here because of recent diagnosis of left proximal medial thigh soft tissue mass that he first noticed approximately 1 year ago as a small nodule. Reported steadily increased in size but never caused him any discomfort. He states he was seen in the emergency room both at [facility] and at [facility] where he was recommended to have follow-up. He specifically denies any numbness, radiating pain or tingling, buckling of the left lower extremity or any symptoms whatsoever associated with the mass other than its presence. Most recently he develops sudden onset of headache with associated lateralizing weakness prompting evaluation emergency room where he was diagnosed with an ischemic stroke. As prompted in extensive cardiac evaluation which is still ongoing in the outpatient setting. He did go to a relative rehabilitation facility where he was able to be discharged. He reports currently he has no residual weakness and no neurologic symptoms at the present time is being maintained on aspirin.  I reviewed her records extensively and collaborated the history with the patient.  SUMMARY OF ONCOLOGIC HISTORY:  No history exists.    MEDICAL HISTORY:  Past Medical History  Diagnosis Date  . Stroke [facility])     SURGICAL HISTORY: Past Surgical History  Procedure Laterality Date  . None    . Tee without cardioversion N/A 11/11/2015    Procedure: TRANSESOPHAGEAL ECHOCARDIOGRAM (TEE);  Surgeon: Herminio Commons, MD;  Location: [place];  Service:  Cardiovascular;  Laterality: N/A;    SOCIAL HISTORY: Social History   Social History  . Marital Status: Single    Spouse Name: N/A  . Number of Children: N/A  . 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 Smoking status: Never Smoker   . Smokeless tobacco: Not on file  . Alcohol Use: 0.0 oz/week    0 Standard drinks or equivalent per week (Prior to stroke was drinking 6 beers/day for >68yrs)     Comment: occasionally drinks beer  . Drug Use: No  . Sexual Activity: Not on file   Other Topics Concern  . Not on file   Social History Narrative    FAMILY HISTORY: Family History  Problem Relation Age of Onset  . Diabetes Mellitus II Maternal Grandmother   . Diabetes Mellitus II Maternal Grandfather   . Cancer Father     Pancreatic Breast                                          Mother    ALLERGIES:  has No Known Allergies.  MEDICATIONS:  Current Outpatient Prescriptions  Medication Sig Dispense Refill  . aspirin EC 325 MG EC tablet Take 1 tablet (325 mg total) by mouth daily. 100 tablet 0  . atorvastatin (LIPITOR) 40 MG tablet Take 1 tablet (40 mg total) by mouth daily at 6 PM. 30 tablet 0  . losartan (COZAAR) 25 MG tablet Take 1 tablet (25 mg total) by mouth daily. 30 tablet 0  .  metoprolol succinate (TOPROL XL) 25 MG 24 hr tablet Take 1 tablet (25 mg total) by mouth daily. 30 tablet 0   No current facility-administered medications for this visit.    REVIEW OF SYSTEMS:   Constitutional: Denies fevers, chills or abnormal night sweats Eyes: Denies blurriness of vision, double vision or watery eyes Ears, nose, mouth, throat, and face: Denies mucositis or sore throat Respiratory: Denies cough, dyspnea or wheezes Cardiovascular: Denies palpitation, chest discomfort or lower extremity swelling Gastrointestinal:  Denies nausea, heartburn or change in bowel habits Skin: Denies abnormal skin rashes Lymphatics:  Denies new lymphadenopathy or easy bruising Neurological:Denies numbness, tingling or new weaknesses Behavioral/Psych: Mood is stable, no new changes  All other systems were reviewed with the patient and are negative.  PHYSICAL EXAMINATION: ECOG PERFORMANCE STATUS: 0 - Asymptomatic  Filed Vitals:   11/23/15 1418  Pulse: 86  Temp: 98.4 F (36.9 C)  Resp: 20   Filed Weights   11/23/15 1418  Weight: 240 lb 7 oz (109.062 kg)    GENERAL:alert, no distress and comfortable SKIN: skin color, texture, turgor are normal, no rashes or significant lesions EYES: normal, conjunctiva are pink and non-injected, sclera clear OROPHARYNX:no exudate, no erythema and lips, buccal mucosa, and tongue normal  NECK: supple, thyroid normal size, non-tender, without nodularity LYMPH:  no palpable lymphadenopathy in the cervical, axillary or inguinal LUNGS: clear to auscultation and percussion with normal breathing effort HEART: regular rate & rhythm and no murmurs and no lower extremity edema ABDOMEN:abdomen soft, non-tender and normal bowel sounds Musculoskeletal:no cyanosis of digits and no clubbing.  Well-circumscribed soft tissue mass located in the proximal medial third of the left thigh. No overlying skin changes. It is relatively soft, nontender with no erythema warmth or discharge.  PSYCH: alert & oriented x 3 with fluent speech NEURO: no focal motor/sensory deficits.  5/5 strength throughout Bilat LE.   LABORATORY DATA:  I have reviewed the data as listed Lab Results  Component Value Date   WBC 3.9* 11/17/2015   HGB 14.4 11/17/2015   HCT 42.2 11/17/2015   MCV 82.7 11/17/2015   PLT 255 11/17/2015   Lab Results  Component Value Date   NA 139 11/04/2015   K 3.8 11/04/2015   CL 103 11/04/2015   CO2 25 11/04/2015    RADIOGRAPHIC STUDIES: I have personally reviewed the radiological reports and agreed with the findings in the report. MRI L Thigh 11/05/2015 FINDINGS: No acute fracture,  dislocation or avascular necrosis. No periosteal reaction or bone destruction. Mild linear T2 hyperintense signal in the right femoral neck likely reflecting red marrow.  Well-circumscribed 7.1 x 7.3 x 9.2 cm nonenhancing mass just posterior to the left sartorius muscle adjacent to the neurovascular bundle. The mass is predominantly T2 hyperintense with interspersed areas of T1 hyperintensity consistent with fat.  No other soft tissue mass. Muscles are normal in signal. No muscle edema or atrophy. No other fluid collection or hematoma.FINDINGS: No acute fracture, dislocation or avascular necrosis. No periosteal reaction or bone destruction. Mild linear T2 hyperintense signal in the right femoral neck likely reflecting red marrow.  Well-circumscribed 7.1 x 7.3 x 9.2 cm nonenhancing mass just posterior to the left sartorius muscle adjacent to the neurovascular bundle. The mass is predominantly T2 hyperintense with interspersed areas of T1 hyperintensity consistent with fat.  No other soft tissue mass. Muscles are normal in signal. No muscle edema or atrophy. No other fluid collection or hematoma.  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 Casimer Lanius MD Pathologist, Electronic Signature (Case signed 11/18/2015)  ASSESSMENT AND PLAN:  Patient with a newly diagnosed left proximal medial thigh atypical lipomatous tumor. This is, located by recent stroke for which a cardiac workup is currently underway. Of note he has recently had a transesophageal echo which demonstrates a low ejection fraction (20-25 percent).  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  I have recommended that we follow up with him after we have had a chance to get a  complete recommendation from the cardiology team regarding next steps. If he is cleared from a cardiology standpoint than the appropriate next step would be a resection of the mass. We will see him back after we've completed cardiac evaluation for final treatment planning.   All questions were answered. The patient knows to call the clinic with any problems, questions or concerns.    Frederich Cha, MD 2:47 PM

## 2015-11-24 ENCOUNTER — Encounter: Payer: Self-pay | Admitting: Physician Assistant

## 2015-11-26 ENCOUNTER — Ambulatory Visit (INDEPENDENT_AMBULATORY_CARE_PROVIDER_SITE_OTHER): Payer: Self-pay | Admitting: Physician Assistant

## 2015-11-26 ENCOUNTER — Encounter: Payer: Self-pay | Admitting: Physician Assistant

## 2015-11-26 VITALS — BP 136/88 | HR 90 | Ht 73.0 in | Wt 235.0 lb

## 2015-11-26 DIAGNOSIS — C499 Malignant neoplasm of connective and soft tissue, unspecified: Secondary | ICD-10-CM

## 2015-11-26 DIAGNOSIS — E785 Hyperlipidemia, unspecified: Secondary | ICD-10-CM

## 2015-11-26 DIAGNOSIS — Z8673 Personal history of transient ischemic attack (TIA), and cerebral infarction without residual deficits: Secondary | ICD-10-CM

## 2015-11-26 DIAGNOSIS — I429 Cardiomyopathy, unspecified: Secondary | ICD-10-CM

## 2015-11-26 DIAGNOSIS — I5022 Chronic systolic (congestive) heart failure: Secondary | ICD-10-CM

## 2015-11-26 LAB — CBC WITH DIFFERENTIAL/PLATELET
Basophils Absolute: 0 cells/uL (ref 0–200)
Basophils Relative: 0 %
Eosinophils Absolute: 162 cells/uL (ref 15–500)
Eosinophils Relative: 3 %
HCT: 40.4 % (ref 38.5–50.0)
Hemoglobin: 13.8 g/dL (ref 13.2–17.1)
Lymphocytes Relative: 29 %
Lymphs Abs: 1566 cells/uL (ref 850–3900)
MCH: 27.8 pg (ref 27.0–33.0)
MCHC: 34.2 g/dL (ref 32.0–36.0)
MCV: 81.5 fL (ref 80.0–100.0)
MPV: 10.7 fL (ref 7.5–12.5)
Monocytes Absolute: 594 cells/uL (ref 200–950)
Monocytes Relative: 11 %
Neutro Abs: 3078 cells/uL (ref 1500–7800)
Neutrophils Relative %: 57 %
Platelets: 220 10*3/uL (ref 140–400)
RBC: 4.96 MIL/uL (ref 4.20–5.80)
RDW: 14.4 % (ref 11.0–15.0)
WBC: 5.4 10*3/uL (ref 3.8–10.8)

## 2015-11-26 LAB — BASIC METABOLIC PANEL
BUN: 8 mg/dL (ref 7–25)
CO2: 26 mmol/L (ref 20–31)
Calcium: 9.1 mg/dL (ref 8.6–10.3)
Chloride: 103 mmol/L (ref 98–110)
Creat: 1.16 mg/dL (ref 0.60–1.35)
Glucose, Bld: 73 mg/dL (ref 65–99)
Potassium: 4 mmol/L (ref 3.5–5.3)
Sodium: 141 mmol/L (ref 135–146)

## 2015-11-26 LAB — APTT: aPTT: 35 seconds (ref 24–37)

## 2015-11-26 LAB — PROTIME-INR
INR: 1.08 (ref <1.50)
Prothrombin Time: 14.1 seconds (ref 11.6–15.2)

## 2015-11-26 MED ORDER — CARVEDILOL 6.25 MG PO TABS: 6.2500 mg | ORAL_TABLET | Freq: Two times a day (BID) | ORAL | Status: DC

## 2015-11-26 NOTE — Patient Instructions (Addendum)
Medication Instructions:  Your physician has recommended you make the following change in your medication:  1. Discontinue Toprol XL 2. Start Coreg ( 6.25 mg) twice a day, was sent in today to patient's requested pharmacy Labwork: Your physician recommends that you have  lab work today: BMET/CBC/PTT/PT/INR Testing/Procedures: See other instructions Follow-Up: Your physician recommends that you keep your scheduled  follow-up appointment with Richardson Dopp, PA-C Any Other Special Instructions Will Be Listed Below (If Applicable). Your provider has recommended a cardiac catherization You are scheduled for a cardiac catheterization on Tuesday, May 2 with Dr. Irish Lack  or associate.  Go to [facility] 2nd Floor Short Stay on Tuesday, May 2  at 5:30 am.  Enter thru the [employer] entrance A No food or drink after midnight on Monday, May 1. You may take your medications with a sip of water on the day of your procedure.  If you need a refill on your cardiac medications before your next appointment, please call your pharmacy.  Coronary Angiogram A coronary angiogram, also called coronary angiography, is an X-ray procedure used to look at the arteries in the heart. In this procedure, a dye (contrast dye) is injected through a long, hollow tube (catheter). The catheter is about the size of a piece of cooked spaghetti and is inserted through your groin, wrist, or arm. The dye is injected into each artery, and X-rays are then taken to show if there is a blockage in the arteries of your heart. LET [facility] CARE PROVIDER KNOW ABOUT:  Any allergies you have, including allergies to shellfish or contrast dye.   All medicines you are taking, including vitamins, herbs, eye drops, creams, and over-the-counter medicines.   Previous problems you or members of your family have had with the use of anesthetics.   Any blood disorders you have.   Previous surgeries you have had.  History of kidney  problems or failure.   Other medical conditions you have. RISKS AND COMPLICATIONS  Generally, a coronary angiogram is a safe procedure. However, problems can occur and include:  Allergic reaction to the dye.  Bleeding from the access site or other locations.  Kidney injury, especially in people with impaired kidney function.  Stroke (rare).  Heart attack (rare). BEFORE THE PROCEDURE   Do not eat or drink anything after midnight the night before the procedure or as directed by your health care provider.   Ask your health care provider about changing or stopping your regular medicines. This is especially important if you are taking diabetes medicines or blood thinners. PROCEDURE  You may be given a medicine to help you relax (sedative) before the procedure. This medicine is given through an intravenous (IV) access tube that is inserted into one of your veins.   The area where the catheter will be inserted will be washed and shaved. This is usually done in the groin but may be done in the fold of your arm (near your elbow) or in the wrist.   A medicine will be given to numb the area where the catheter will be inserted (local anesthetic).   The health care provider will insert the catheter into an artery. The catheter will be guided by using a special type of X-ray (fluoroscopy) of the blood vessel being examined.   A special dye will then be injected into the catheter, and X-rays will be taken. The dye will help to show where any narrowing or blockages are located in the heart arteries.  AFTER  THE PROCEDURE   If the procedure is done through the leg, you will be kept in bed lying flat for several hours. You will be instructed to not bend or cross your legs.  The insertion site will be checked frequently.   The pulse in your feet or wrist will be checked frequently.   Additional blood tests, X-rays, and an electrocardiogram may be done.    This information is not  intended to replace advice given to you by your health care provider. Make sure you discuss any questions you have with your health care provider.   Document Released: 01/21/2003 Document Revised: 08/07/2014 Document Reviewed: 12/09/2012 Elsevier Interactive Patient Education [employer].

## 2015-11-26 NOTE — Progress Notes (Signed)
Cardiology Office Note:    Date:  11/26/2015   ID:  Austin Holmes, DOB [birth year], MRN [id number]  PCP:  [name] Patient  Cardiologist:  Dr. Casandra Doffing   Electrophysiologist:  n/a  Referring MD: Charlett Blake, MD   Chief Complaint  Patient presents with  . Hospitalization Follow-up    New Dx Cardiomyopathy; s/p CVA    History of Present Illness:     Austin Holmes is a 45 y.o. male with no sig past hx.    Admitted to [facility] 4/3-4/5 with left temporal and thalamic CVA. Notes indicate there was no carotid stenosis. Echo demonstrated dilated cardiomyopathy with EF 25-30%. He was discharged to inpatient rehabilitation at [facility], evaluated by Dr. Irish Lack for cardiology. CTA done at [facility] demonstrated left P2 PCA high-grade stenosis which was likely the etiology of his stroke. ECG demonstrated LBBB. It was suspected that the patient's cardiomyopathy was related to alcohol. He was advised to stop drinking. Recommendation was to proceed with cardiac catheterization after recovery from stroke. This would need to be delayed 4-6 weeks. Was noted to have a large mass on his left thigh. This measured 7.1 x 7.3 x 9.2 cm. Neurology saw the patient and recommended TEE. There was no evidence of thrombus.  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 Patient has seen Dr. Rolanda Jay (surgical oncologist). Plan is to remove the mass once cleared by cardiology.  Returns for FU.  Here with his mother today.  He is doing well.  Denies any residual weakness or speech issues.  The patient denies chest pain, shortness of breath, syncope, orthopnea, PND or significant pedal edema.  He has been very active since DC. Not currently going to PT.     Past Medical History  Diagnosis Date  . Stroke ([place])   . DCM (dilated cardiomyopathy) ([place])   . Chronic systolic CHF (congestive heart failure) ([place])     a.  Echo 4/17 - EF 20-25%, diffuse HK, trivial AI, mild LAE, mild RAE, mildly dilated RV  . HLD (hyperlipidemia)   . 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 None    . Tee without cardioversion N/A 11/11/2015    Procedure: TRANSESOPHAGEAL ECHOCARDIOGRAM (TEE);  Surgeon: Herminio Commons, MD;  Location[facility] ENDOSCOPY;  Service: Cardiovascular;  Laterality: N/A;    Current Medications: Outpatient Prescriptions Prior to Visit  Medication Sig Dispense Refill  . aspirin EC 325 MG EC tablet Take 1 tablet (325 mg total) by mouth daily. 100 tablet 0  . atorvastatin (LIPITOR) 40 MG tablet Take 1 tablet (40 mg total) by mouth daily at 6 PM. 30 tablet 0  . losartan (COZAAR) 25 MG tablet Take 1 tablet (25 mg total) by mouth daily. 30 tablet 0  . metoprolol succinate (TOPROL XL) 25 MG 24 hr tablet Take 1 tablet (25 mg total) by mouth daily. 30 tablet 0   No facility-administered medications prior to visit.      Current Meds  Medication Sig  . aspirin EC 325 MG EC tablet Take 1 tablet (325 mg total) by mouth daily.  Marland Kitchen atorvastatin (LIPITOR) 40 MG tablet Take 1 tablet (40 mg total) by mouth daily at 6 PM.  . losartan (COZAAR) 25 MG tablet Take 1 tablet (25 mg total) by mouth daily.  . [DISCONTINUED] metoprolol succinate (TOPROL XL) 25  MG 24 hr tablet Take 1 tablet (25 mg total) by mouth daily.      Allergies:   Review of patient's allergies indicates no known allergies.   Social History   Social History  . Marital Status: Single    Spouse Name: N/A  . Number of Children: N/A  . 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 Smoking status: Never Smoker   . Smokeless tobacco: None  . Alcohol Use: 0.0 oz/week    0 Standard drinks or equivalent per week     Comment: occasionally drinks beer  . Drug Use: No  . Sexual Activity: Not Asked   Other Topics Concern  . None   Social  History Narrative     Family History:  The patient's family history includes Cancer in his father; Diabetes Mellitus II in his maternal grandfather and maternal grandmother.   ROS:   Please see the history of present illness.    ROS All other systems reviewed and are negative.   Physical Exam:    VS:  BP 136/88 mmHg  Pulse 90  Ht 6\' 1"  (1.854 m)  Wt 235 lb (106.595 kg)  BMI 31.01 kg/m2   GEN: Well nourished, well developed, in no acute distress HEENT: normal Neck: no JVD, no masses Cardiac: Normal S1/S2, RRR; no murmurs, rubs, or gallops, no edema;     Respiratory:  clear to auscultation bilaterally; no wheezing, rhonchi or rales GI: soft, nontender, nondistended MS: no deformity or atrophy Skin: warm and dry Neuro: No focal deficits  Psych: Alert and oriented x 3, normal affect  Wt Readings from Last 3 Encounters:  11/26/15 235 lb (106.595 kg)  11/23/15 240 lb 7 oz (109.062 kg)  11/03/15 232 lb 14.4 oz (105.643 kg)      Studies/Labs Reviewed:     EKG:  EKG is  ordered today.  The ekg ordered today demonstrates NSR, HR 90, LBBB  Recent Labs: 11/04/2015: ALT 42; BUN 10; Creatinine, Ser 1.23; Potassium 3.8; Sodium 139 11/17/2015: Hemoglobin 14.4; Platelets 255   Recent Lipid Panel    Component Value Date/Time   CHOL 202* 11/02/2015 0557   TRIG 37 11/02/2015 0557   HDL 91 11/02/2015 0557   CHOLHDL 2.2 11/02/2015 0557   VLDL 7 11/02/2015 0557   LDLCALC 104* 11/02/2015 0557    Additional studies/ records that were reviewed today include:   TEE 11/11/15 EF 20-25%, diffuse HK, no apical clot, mild to moderately reduced RVSF, no R-L shunt - No intracardiac source of thrombus identified.  Echo 11/03/15 EF 20-25%, diffuse HK, trivial AI, mild LAE, mild RAE, mildly dilated RV    ASSESSMENT:     1. Cardiomyopathy ([place])   2. Chronic systolic CHF (congestive heart failure) ([place])   3. HLD (hyperlipidemia)   4. Myxoid liposarcoma ([place])   5. History of CVA  (cerebrovascular accident)     PLAN:     In order of problems listed above:  1. DCM - Etiology not clear.  Patient needs LHC to further evaluate given LBBB.  He had recent CVA 11/01/2015.  D/w Dr. Casandra Doffing today.  He rec that we proceed with cath next week.  Risks and benefits of cardiac catheterization have been discussed with the patient.  These include bleeding, infection, kidney damage, stroke, heart attack, death.  The patient understands these risks and is willing to proceed.   2. Chronic systolic CHF -  NYHA 1. Continue beta-blocker, angiotensin receptor blocker.  BP and HR could tolerated Carvedilol.  -  DC Toprol  -  Start Carvedilol 6.25 mg bid   3. HL - Continue statin.  Cost may be an issue.  If Atrova too expensive, we can change to Pravastatin 40 QHS  4. Lipomatous Tumor - Plan is for resection when cleared from Cardiology.  D/w Dr. Casandra Doffing.  Will need cath before we can clear for surgery.  Plan LHC with Dr. Casandra Doffing next week if possible.   5. Hx of CVA - Continue ASA, statin.  FU with Neuro as planned.    Medication Adjustments/Labs and Tests Ordered: Current medicines are reviewed at length with the patient today.  Concerns regarding medicines are outlined above.  Medication changes, Labs and Tests ordered today are outlined in the Patient Instructions noted below. Patient Instructions  Medication Instructions:  Your physician has recommended you make the following change in your medication:  1. Discontinue Toprol XL 2. Start Coreg ( 6.25 mg) twice a day, was sent in today to patient's requested pharmacy Labwork: Your physician recommends that you have  lab work today: BMET/CBC/PTT/PT/INR Testing/Procedures: See other instructions Follow-Up: Your physician recommends that you keep your scheduled  follow-up appointment with Richardson Dopp, PA-C Any Other Special Instructions Will Be Listed Below (If Applicable). Your provider has recommended a cardiac  catherization You are scheduled for a cardiac catheterization on Tuesday, May 2 with Dr. Irish Lack  or associate.  Go to [facility] 2nd Floor Short Stay on Tuesday, May 2  at 5:30 am.  Enter thru the [employer] entrance A No food or drink after midnight on Monday, May 1. You may take your medications with a sip of water on the day of your procedure.  If you need a refill on your cardiac medications before your next appointment, please call your pharmacy.   Signed, Richardson Dopp, PA-C  11/26/2015 2:08 PM    [facility] [place], [place], [place]  [address] Phone: [phone]; Fax: [phone]

## 2015-11-29 ENCOUNTER — Telehealth: Payer: Self-pay | Admitting: Physician Assistant

## 2015-11-29 NOTE — Telephone Encounter (Signed)
New message ° ° ° ° °Returning a call to the nurse to get lab results °

## 2015-11-29 NOTE — Telephone Encounter (Signed)
Informed pt of lab results. Pt verbalized understanding. 

## 2015-11-30 ENCOUNTER — Encounter (HOSPITAL_COMMUNITY)
Admission: RE | Disposition: A | Discharge status: Home / Self Care | Payer: Self-pay | Source: Ambulatory Visit | Attending: Interventional Cardiology

## 2015-11-30 ENCOUNTER — Ambulatory Visit (HOSPITAL_COMMUNITY)
Admission: RE | Admit: 2015-11-30 | Discharge: 2015-11-30 | Disposition: A | Discharge status: Home / Self Care | Payer: Self-pay | Source: Ambulatory Visit | Attending: Interventional Cardiology | Admitting: Interventional Cardiology

## 2015-11-30 ENCOUNTER — Encounter (HOSPITAL_COMMUNITY): Payer: Self-pay | Admitting: Interventional Cardiology

## 2015-11-30 DIAGNOSIS — I429 Cardiomyopathy, unspecified: Secondary | ICD-10-CM

## 2015-11-30 DIAGNOSIS — E785 Hyperlipidemia, unspecified: Secondary | ICD-10-CM | POA: Insufficient documentation

## 2015-11-30 DIAGNOSIS — Z7982 Long term (current) use of aspirin: Secondary | ICD-10-CM | POA: Insufficient documentation

## 2015-11-30 DIAGNOSIS — I420 Dilated cardiomyopathy: Secondary | ICD-10-CM | POA: Insufficient documentation

## 2015-11-30 DIAGNOSIS — C4920 Malignant neoplasm of connective and soft tissue of unspecified lower limb, including hip: Secondary | ICD-10-CM | POA: Insufficient documentation

## 2015-11-30 DIAGNOSIS — I5022 Chronic systolic (congestive) heart failure: Secondary | ICD-10-CM | POA: Insufficient documentation

## 2015-11-30 DIAGNOSIS — Z8673 Personal history of transient ischemic attack (TIA), and cerebral infarction without residual deficits: Secondary | ICD-10-CM | POA: Insufficient documentation

## 2015-11-30 DIAGNOSIS — I447 Left bundle-branch block, unspecified: Secondary | ICD-10-CM | POA: Insufficient documentation

## 2015-11-30 HISTORY — PX: CARDIAC CATHETERIZATION: SHX172

## 2015-11-30 SURGERY — LEFT HEART CATH AND CORONARY ANGIOGRAPHY
Anesthesia: LOCAL

## 2015-11-30 MED ORDER — LIDOCAINE HCL (PF) 1 % IJ SOLN
INTRAMUSCULAR | Status: AC
  Filled 2015-11-30: qty 30

## 2015-11-30 MED ORDER — ASPIRIN 81 MG PO CHEW
81.0000 mg | CHEWABLE_TABLET | ORAL | Status: AC
  Administered 2015-11-30: 81 mg via ORAL

## 2015-11-30 MED ORDER — POTASSIUM CHLORIDE CRYS ER 20 MEQ PO TBCR: 20.0000 meq | EXTENDED_RELEASE_TABLET | Freq: Every day | ORAL | Status: DC

## 2015-11-30 MED ORDER — SODIUM CHLORIDE 0.9% FLUSH: 3.0000 mL | INTRAVENOUS | Status: DC | PRN

## 2015-11-30 MED ORDER — FENTANYL CITRATE (PF) 100 MCG/2ML IJ SOLN
INTRAMUSCULAR | Status: DC | PRN
  Administered 2015-11-30: 25 ug via INTRAVENOUS

## 2015-11-30 MED ORDER — HEPARIN (PORCINE) IN NACL 2-0.9 UNIT/ML-% IJ SOLN
INTRAMUSCULAR | Status: AC
  Filled 2015-11-30: qty 1000

## 2015-11-30 MED ORDER — SODIUM CHLORIDE 0.9 % IV SOLN
INTRAVENOUS | Status: DC
  Administered 2015-11-30: 06:00:00 via INTRAVENOUS

## 2015-11-30 MED ORDER — VERAPAMIL HCL 2.5 MG/ML IV SOLN
INTRAVENOUS | Status: DC | PRN
  Administered 2015-11-30: 10 mL via INTRA_ARTERIAL

## 2015-11-30 MED ORDER — FENTANYL CITRATE (PF) 100 MCG/2ML IJ SOLN
INTRAMUSCULAR | Status: AC
  Filled 2015-11-30: qty 2

## 2015-11-30 MED ORDER — HEPARIN SODIUM (PORCINE) 1000 UNIT/ML IJ SOLN
INTRAMUSCULAR | Status: DC | PRN
  Administered 2015-11-30: 5000 [IU] via INTRAVENOUS

## 2015-11-30 MED ORDER — HEPARIN SODIUM (PORCINE) 1000 UNIT/ML IJ SOLN
INTRAMUSCULAR | Status: AC
  Filled 2015-11-30: qty 1

## 2015-11-30 MED ORDER — SODIUM CHLORIDE 0.9 % IV SOLN: 250.0000 mL | INTRAVENOUS | Status: DC | PRN

## 2015-11-30 MED ORDER — HEPARIN (PORCINE) IN NACL 2-0.9 UNIT/ML-% IJ SOLN
INTRAMUSCULAR | Status: DC | PRN
  Administered 2015-11-30: 1500 mL

## 2015-11-30 MED ORDER — VERAPAMIL HCL 2.5 MG/ML IV SOLN
INTRAVENOUS | Status: AC
  Filled 2015-11-30: qty 2

## 2015-11-30 MED ORDER — MIDAZOLAM HCL 2 MG/2ML IJ SOLN
INTRAMUSCULAR | Status: AC
  Filled 2015-11-30: qty 2

## 2015-11-30 MED ORDER — MIDAZOLAM HCL 2 MG/2ML IJ SOLN
INTRAMUSCULAR | Status: DC | PRN
  Administered 2015-11-30: 2 mg via INTRAVENOUS

## 2015-11-30 MED ORDER — FUROSEMIDE 40 MG PO TABS: 40.0000 mg | ORAL_TABLET | Freq: Every day | ORAL | Status: DC

## 2015-11-30 MED ORDER — ASPIRIN 81 MG PO CHEW
CHEWABLE_TABLET | ORAL | Status: AC
  Filled 2015-11-30: qty 1

## 2015-11-30 MED ORDER — HEPARIN (PORCINE) IN NACL 2-0.9 UNIT/ML-% IJ SOLN
INTRAMUSCULAR | Status: AC
Start: 2015-11-30 — End: 2015-11-30
  Filled 2015-11-30: qty 500

## 2015-11-30 MED ORDER — IOPAMIDOL (ISOVUE-370) INJECTION 76%
INTRAVENOUS | Status: DC | PRN
  Administered 2015-11-30: 80 mL via INTRA_ARTERIAL

## 2015-11-30 MED ORDER — SODIUM CHLORIDE 0.9% FLUSH: 3.0000 mL | Freq: Two times a day (BID) | INTRAVENOUS | Status: DC

## 2015-11-30 MED ORDER — LIDOCAINE HCL (PF) 1 % IJ SOLN
INTRAMUSCULAR | Status: DC | PRN
  Administered 2015-11-30: 2 mL via INTRADERMAL

## 2015-11-30 MED ORDER — IOPAMIDOL (ISOVUE-370) INJECTION 76%
INTRAVENOUS | Status: AC
  Filled 2015-11-30: qty 100

## 2015-11-30 SURGICAL SUPPLY — 12 items
CATH INFINITI 5 FR 3DRC (CATHETERS) ×2 IMPLANT
CATH INFINITI 5 FR JL3.5 (CATHETERS) ×2 IMPLANT
CATH INFINITI 5FR ANG PIGTAIL (CATHETERS) ×2 IMPLANT
CATH INFINITI JR4 5F (CATHETERS) ×2 IMPLANT
DEVICE RAD COMP TR BAND LRG (VASCULAR PRODUCTS) ×2 IMPLANT
GLIDESHEATH SLEND SS 6F .021 (SHEATH) ×2 IMPLANT
KIT HEART LEFT (KITS) ×2 IMPLANT
PACK CARDIAC CATHETERIZATION (CUSTOM PROCEDURE TRAY) ×2 IMPLANT
SYR MEDRAD MARK V 150ML (SYRINGE) ×2 IMPLANT
TRANSDUCER W/STOPCOCK (MISCELLANEOUS) ×2 IMPLANT
TUBING CIL FLEX 10 FLL-RA (TUBING) ×2 IMPLANT
WIRE SAFE-T 1.5MM-J .035X260CM (WIRE) ×2 IMPLANT

## 2015-11-30 NOTE — H&P (View-Only) (Signed)
Cardiology Office Note:    Date:  11/26/2015   ID:  Austin Holmes, DOB [birth year], MRN [id number]  PCP:  [name] Patient  Cardiologist:  Dr. Casandra Doffing   Electrophysiologist:  n/a  Referring MD: Charlett Blake, MD   Chief Complaint  Patient presents with  . Hospitalization Follow-up    New Dx Cardiomyopathy; s/p CVA    History of Present Illness:     Austin Holmes is a 45 y.o. male with no sig past hx.    Admitted to [facility] 4/3-4/5 with left temporal and thalamic CVA. Notes indicate there was no carotid stenosis. Echo demonstrated dilated cardiomyopathy with EF 25-30%. He was discharged to inpatient rehabilitation at [facility], evaluated by Dr. Irish Lack for cardiology. CTA done at [facility] demonstrated left P2 PCA high-grade stenosis which was likely the etiology of his stroke. ECG demonstrated LBBB. It was suspected that the patient's cardiomyopathy was related to alcohol. He was advised to stop drinking. Recommendation was to proceed with cardiac catheterization after recovery from stroke. This would need to be delayed 4-6 weeks. Was noted to have a large mass on his left thigh. This measured 7.1 x 7.3 x 9.2 cm. Neurology saw the patient and recommended TEE. There was no evidence of thrombus.  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 Patient has seen Dr. Rolanda Jay (surgical oncologist). Plan is to remove the mass once cleared by cardiology.  Returns for FU.  Here with his mother today.  He is doing well.  Denies any residual weakness or speech issues.  The patient denies chest pain, shortness of breath, syncope, orthopnea, PND or significant pedal edema.  He has been very active since DC. Not currently going to PT.     Past Medical History  Diagnosis Date  . Stroke ([place])   . DCM (dilated cardiomyopathy) ([place])   . Chronic systolic CHF (congestive heart failure) ([place])     a.  Echo 4/17 - EF 20-25%, diffuse HK, trivial AI, mild LAE, mild RAE, mildly dilated RV  . HLD (hyperlipidemia)   . 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 None    . Tee without cardioversion N/A 11/11/2015    Procedure: TRANSESOPHAGEAL ECHOCARDIOGRAM (TEE);  Surgeon: Herminio Commons, MD;  Location: [facility] ENDOSCOPY;  Service: Cardiovascular;  Laterality: N/A;    Current Medications: Outpatient Prescriptions Prior to Visit  Medication Sig Dispense Refill  . aspirin EC 325 MG EC tablet Take 1 tablet (325 mg total) by mouth daily. 100 tablet 0  . atorvastatin (LIPITOR) 40 MG tablet Take 1 tablet (40 mg total) by mouth daily at 6 PM. 30 tablet 0  . losartan (COZAAR) 25 MG tablet Take 1 tablet (25 mg total) by mouth daily. 30 tablet 0  . metoprolol succinate (TOPROL XL) 25 MG 24 hr tablet Take 1 tablet (25 mg total) by mouth daily. 30 tablet 0   No facility-administered medications prior to visit.      Current Meds  Medication Sig  . aspirin EC 325 MG EC tablet Take 1 tablet (325 mg total) by mouth daily.  Marland Kitchen atorvastatin (LIPITOR) 40 MG tablet Take 1 tablet (40 mg total) by mouth daily at 6 PM.  . losartan (COZAAR) 25 MG tablet Take 1 tablet (25 mg total) by mouth daily.  . [DISCONTINUED] metoprolol succinate (TOPROL XL) 25  MG 24 hr tablet Take 1 tablet (25 mg total) by mouth daily.      Allergies:   Review of patient's allergies indicates no known allergies.   Social History   Social History  . Marital Status: Single    Spouse Name: N/A  . Number of Children: N/A  . 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 Smoking status: Never Smoker   . Smokeless tobacco: None  . Alcohol Use: 0.0 oz/week    0 Standard drinks or equivalent per week     Comment: occasionally drinks beer  . Drug Use: No  . Sexual Activity: Not Asked   Other Topics Concern  . None   Social  History Narrative     Family History:  The patient's family history includes Cancer in his father; Diabetes Mellitus II in his maternal grandfather and maternal grandmother.   ROS:   Please see the history of present illness.    ROS All other systems reviewed and are negative.   Physical Exam:    VS:  BP 136/88 mmHg  Pulse 90  Ht 6\' 1"  (1.854 m)  Wt 235 lb (106.595 kg)  BMI 31.01 kg/m2   GEN: Well nourished, well developed, in no acute distress HEENT: normal Neck: no JVD, no masses Cardiac: Normal S1/S2, RRR; no murmurs, rubs, or gallops, no edema;     Respiratory:  clear to auscultation bilaterally; no wheezing, rhonchi or rales GI: soft, nontender, nondistended MS: no deformity or atrophy Skin: warm and dry Neuro: No focal deficits  Psych: Alert and oriented x 3, normal affect  Wt Readings from Last 3 Encounters:  11/26/15 235 lb (106.595 kg)  11/23/15 240 lb 7 oz (109.062 kg)  11/03/15 232 lb 14.4 oz (105.643 kg)      Studies/Labs Reviewed:     EKG:  EKG is  ordered today.  The ekg ordered today demonstrates NSR, HR 90, LBBB  Recent Labs: 11/04/2015: ALT 42; BUN 10; Creatinine, Ser 1.23; Potassium 3.8; Sodium 139 11/17/2015: Hemoglobin 14.4; Platelets 255   Recent Lipid Panel    Component Value Date/Time   CHOL 202* 11/02/2015 0557   TRIG 37 11/02/2015 0557   HDL 91 11/02/2015 0557   CHOLHDL 2.2 11/02/2015 0557   VLDL 7 11/02/2015 0557   LDLCALC 104* 11/02/2015 0557    Additional studies/ records that were reviewed today include:   TEE 11/11/15 EF 20-25%, diffuse HK, no apical clot, mild to moderately reduced RVSF, no R-L shunt - No intracardiac source of thrombus identified.  Echo 11/03/15 EF 20-25%, diffuse HK, trivial AI, mild LAE, mild RAE, mildly dilated RV    ASSESSMENT:     1. Cardiomyopathy ([place])   2. Chronic systolic CHF (congestive heart failure) ([place])   3. HLD (hyperlipidemia)   4. Myxoid liposarcoma ([place])   5. History of CVA  (cerebrovascular accident)     PLAN:     In order of problems listed above:  1. DCM - Etiology not clear.  Patient needs LHC to further evaluate given LBBB.  He had recent CVA 11/01/2015.  D/w Dr. Casandra Doffing today.  He rec that we proceed with cath next week.  Risks and benefits of cardiac catheterization have been discussed with the patient.  These include bleeding, infection, kidney damage, stroke, heart attack, death.  The patient understands these risks and is willing to proceed.   2. Chronic systolic CHF -  NYHA 1. Continue beta-blocker, angiotensin receptor blocker.  BP and HR could tolerated Carvedilol.  -  DC Toprol  -  Start Carvedilol 6.25 mg bid   3. HL - Continue statin.  Cost may be an issue.  If Atrova too expensive, we can change to Pravastatin 40 QHS  4. Lipomatous Tumor - Plan is for resection when cleared from Cardiology.  D/w Dr. Casandra Doffing.  Will need cath before we can clear for surgery.  Plan LHC with Dr. Casandra Doffing next week if possible.   5. Hx of CVA - Continue ASA, statin.  FU with Neuro as planned.    Medication Adjustments/Labs and Tests Ordered: Current medicines are reviewed at length with the patient today.  Concerns regarding medicines are outlined above.  Medication changes, Labs and Tests ordered today are outlined in the Patient Instructions noted below. Patient Instructions  Medication Instructions:  Your physician has recommended you make the following change in your medication:  1. Discontinue Toprol XL 2. Start Coreg ( 6.25 mg) twice a day, was sent in today to patient's requested pharmacy Labwork: Your physician recommends that you have  lab work today: BMET/CBC/PTT/PT/INR Testing/Procedures: See other instructions Follow-Up: Your physician recommends that you keep your scheduled  follow-up appointment with Richardson Dopp, PA-C Any Other Special Instructions Will Be Listed Below (If Applicable). Your provider has recommended a cardiac  catherization You are scheduled for a cardiac catheterization on Tuesday, May 2 with Dr. Irish Lack  or associate.  Go to [facility] 2nd Floor Short Stay on Tuesday, May 2  at 5:30 am.  Enter thru the [employer] entrance A No food or drink after midnight on Monday, May 1. You may take your medications with a sip of water on the day of your procedure.  If you need a refill on your cardiac medications before your next appointment, please call your pharmacy.   Signed, Richardson Dopp, PA-C  11/26/2015 2:08 PM    [facility] [place], [place], [place]  [address] Phone: [phone]; Fax: [phone]

## 2015-11-30 NOTE — Interval H&P Note (Signed)
Cath Lab Visit (complete for each Cath Lab visit)  Clinical Evaluation Leading to the Procedure:   ACS: No.  Non-ACS:    Anginal Classification: CCS II  Anti-ischemic medical therapy: Minimal Therapy (1 class of medications)  Non-Invasive Test Results: High-risk stress test findings: cardiac mortality >3%/year  Prior CABG: No previous CABG      History and Physical Interval Note:  11/30/2015 7:37 AM  Austin Holmes  has presented today for surgery, with the diagnosis of CARDIOMYOPATHY  The various methods of treatment have been discussed with the patient and family. After consideration of risks, benefits and other options for treatment, the patient has consented to  Procedure(s): Left Heart Cath and Coronary Angiography (N/A) as a surgical intervention .  The patient's history has been reviewed, patient examined, no change in status, stable for surgery.  I have reviewed the patient's chart and labs.  Questions were answered to the patient's satisfaction.     VARANASI,JAYADEEP S.

## 2015-11-30 NOTE — Discharge Instructions (Signed)
Radial Site Care °Refer to this sheet in the next few weeks. These instructions provide you with information about caring for yourself after your procedure. Your health care provider may also give you more specific instructions. Your treatment has been planned according to current medical practices, but problems sometimes occur. Call your health care provider if you have any problems or questions after your procedure. °WHAT TO EXPECT AFTER THE PROCEDURE °After your procedure, it is typical to have the following: °· Bruising at the radial site that usually fades within 1-2 weeks. °· Blood collecting in the tissue (hematoma) that may be painful to the touch. It should usually decrease in size and tenderness within 1-2 weeks. °HOME CARE INSTRUCTIONS °· Take medicines only as directed by your health care provider. °· You may shower 24-48 hours after the procedure or as directed by your health care provider. Remove the bandage (dressing) and gently wash the site with plain soap and water. Pat the area dry with a clean towel. Do not rub the site, because this may cause bleeding. °· Do not take baths, swim, or use a hot tub until your health care provider approves. °· Check your insertion site every day for redness, swelling, or drainage. °· Do not apply powder or lotion to the site. °· Do not flex or bend the affected arm for 24 hours or as directed by your health care provider. °· Do not push or pull heavy objects with the affected arm for 24 hours or as directed by your health care provider. °· Do not lift over 10 lb (4.5 kg) for 5 days after your procedure or as directed by your health care provider. °· Ask your health care provider when it is okay to: °¨ Return to work or school. °¨ Resume usual physical activities or sports. °¨ Resume sexual activity. °· Do not drive home if you are discharged the same day as the procedure. Have someone else drive you. °· You may drive 24 hours after the procedure unless otherwise  instructed by your health care provider. °· Do not operate machinery or power tools for 24 hours after the procedure. °· If your procedure was done as an outpatient procedure, which means that you went home the same day as your procedure, a responsible adult should be with you for the first 24 hours after you arrive home. °· Keep all follow-up visits as directed by your health care provider. This is important. °SEEK MEDICAL CARE IF: °· You have a fever. °· You have chills. °· You have increased bleeding from the radial site. Hold pressure on the site. °SEEK IMMEDIATE MEDICAL CARE IF: °· You have unusual pain at the radial site. °· You have redness, warmth, or swelling at the radial site. °· You have drainage (other than a small amount of blood on the dressing) from the radial site. °· The radial site is bleeding, and the bleeding does not stop after 30 minutes of holding steady pressure on the site. °· Your arm or hand becomes pale, cool, tingly, or numb. °  °This information is not intended to replace advice given to you by your health care provider. Make sure you discuss any questions you have with your health care provider. °  °Document Released: 08/19/2010 Document Revised: 08/07/2014 Document Reviewed: 02/02/2014 °Elsevier Interactive Patient Education ©2016 [employer] ° °

## 2015-12-03 ENCOUNTER — Ambulatory Visit: Payer: Self-pay | Admitting: Oncology

## 2015-12-06 ENCOUNTER — Other Ambulatory Visit: Payer: Self-pay | Admitting: Physical Medicine and Rehabilitation

## 2015-12-07 ENCOUNTER — Ambulatory Visit: Payer: Self-pay | Admitting: General Surgery

## 2015-12-07 NOTE — Telephone Encounter (Signed)
This medication refill request would be best handled by patient's cardiologist

## 2015-12-12 NOTE — Progress Notes (Signed)
Cardiology Office Note:    Date:  12/13/2015   ID:  Austin Holmes, DOB [birth year], MRN [id number]  PCP:  [name] Patient  Cardiologist:  Dr. Casandra Doffing   Electrophysiologist:  n/a  Referring MD: Liliane Shi, PA-C   Chief Complaint  Patient presents with  . Congestive Heart Failure    s/p Cardiac Cath    History of Present Illness:     Austin Holmes is a 45 y.o. 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  Admitted to [facility] in 4/17 with left temporal and thalamic CVA. Notes indicate there was no carotid stenosis. Echo demonstrated dilated cardiomyopathy with EF 25-30%. He was discharged to inpatient rehabilitation at [facility] by Dr. Irish Lack for cardiology.  He was noted to have a large mass on his left thigh. This measured 7.1 x 7.3 x 9.2 cm.  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 Patient has seen Dr. Rolanda Jay (surgical oncologist). Plan is to remove the mass once cleared by cardiology.  I saw him in FU last month.  I d/w Dr. Irish Lack and we arranged L heart cath.  This demonstrated no significant CAD, elevated LVEDP and EF 25%. He was placed on Lasix. No further cardiac testing was recommended prior to his tumor excision.  Returns for FU.  He is doing well.  The patient denies any chest pain, significant dyspnea, syncope, orthopnea, PND, edema.   Past Medical History  Diagnosis Date  . Stroke ([place])   . DCM (dilated cardiomyopathy) ([place])   . Chronic systolic CHF (congestive heart failure) ([place])     a. Echo 4/17 - EF 20-25%, diffuse HK, trivial AI, mild LAE, mild RAE, mildly dilated RV  . HLD (hyperlipidemia)   . 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 None    . Tee without cardioversion N/A 11/11/2015    Procedure: TRANSESOPHAGEAL ECHOCARDIOGRAM (TEE);   Surgeon: Herminio Commons, MD;  Location: [facility];  Service: Cardiovascular;  Laterality: N/A;  . Cardiac catheterization N/A 11/30/2015    Procedure: Left Heart Cath and Coronary Angiography;  Surgeon: Jettie Booze, MD;  Location: [facility];  Service: Cardiovascular;  Laterality: N/A;    Current Medications: Current Meds  Medication Sig  . aspirin EC 325 MG EC tablet Take 1 tablet (325 mg total) by mouth daily.  Marland Kitchen atorvastatin (LIPITOR) 40 MG tablet Take 1 tablet (40 mg total) by mouth daily at 6 PM.  . carvedilol (COREG) 6.25 MG tablet Take 1.5 tablets (9.375 mg total) by mouth 2 (two) times daily.  . furosemide (LASIX) 40 MG tablet Take 1 tablet (40 mg total) by mouth daily.  Marland Kitchen losartan (COZAAR) 25 MG tablet TAKE 1 TABLET (25 MG TOTAL) BY MOUTH DAILY.  Marland Kitchen potassium chloride SA (K-DUR,KLOR-CON) 20 MEQ tablet Take 1 tablet (20 mEq total) by mouth daily.  . [DISCONTINUED] atorvastatin (LIPITOR) 40 MG tablet Take 1 tablet (40 mg total) by mouth daily at 6 PM.  . [DISCONTINUED] carvedilol (COREG) 6.25 MG tablet Take 1 tablet (6.25 mg total) by mouth 2 (two) times daily.  . [DISCONTINUED] losartan (COZAAR) 25 MG tablet TAKE 1 TABLET (25 MG TOTAL) BY MOUTH DAILY.      Allergies:   Review of patient's allergies indicates no known allergies.   Social  History   Social History  . Marital Status: Single    Spouse Name: N/A  . Number of Children: N/A  . 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 Smoking status: Never Smoker   . Smokeless tobacco: None  . Alcohol Use: 0.0 oz/week    0 Standard drinks or equivalent per week     Comment: occasionally drinks beer  . Drug Use: No  . Sexual Activity: Not Asked   Other Topics Concern  . None   Social History Narrative     Family History:  The patient's family history includes Cancer in his father; Diabetes Mellitus II in his maternal grandfather and maternal  grandmother.   ROS:   Please see the history of present illness.    Review of Systems  Constitution: Positive for chills.  Eyes: Positive for visual disturbance.   All other systems reviewed and are negative.   Physical Exam:    VS:  BP 110/80 mmHg  Pulse 78  Ht 6\' 1"  (1.854 m)  Wt 251 lb 12.8 oz (114.216 kg)  BMI 33.23 kg/m2   GEN: Well nourished, well developed, in no acute distress HEENT: normal Neck: no JVD, no masses Cardiac: Normal S1/S2, RRR; no murmurs, rubs, or gallops, no edema;  right wrist without hematoma or mass    Respiratory:  clear to auscultation bilaterally; no wheezing, rhonchi or rales GI: soft, nontender, nondistended MS: no deformity or atrophy Skin: warm and dry Neuro: No focal deficits  Psych: Alert and oriented x 3, normal affect  Wt Readings from Last 3 Encounters:  12/13/15 251 lb 12.8 oz (114.216 kg)  11/30/15 235 lb (106.595 kg)  11/26/15 235 lb (106.595 kg)      Studies/Labs Reviewed:     EKG:  EKG is \ ordered today.  The ekg ordered today demonstrates NSR, HR 78, LBBB  Recent Labs: 11/04/2015: ALT 42 11/26/2015: BUN 8; Creat 1.16; Hemoglobin 13.8; Platelets 220; Potassium 4.0; Sodium 141   Recent Lipid Panel    Component Value Date/Time   CHOL 202* 11/02/2015 0557   TRIG 37 11/02/2015 0557   HDL 91 11/02/2015 0557   CHOLHDL 2.2 11/02/2015 0557   VLDL 7 11/02/2015 0557   LDLCALC 104* 11/02/2015 0557    Additional studies/ records that were reviewed today include:   LHC 11/30/15  There is severe left ventricular systolic dysfunction. EF 25%  No significant coronary artery disease.  Elevated LVEDP. Medical therapy for nonischemic cardiomyopathy. Start Lasix and Potassium. Check BMet in one week. Limit sodium intake. No further cardiac testing needed prior to surgery.  TEE 11/11/15 EF 20-25%, diffuse HK, no apical clot, mild to moderately reduced RVSF, no R-L shunt - No intracardiac source of thrombus identified.  Echo  11/03/15 EF 20-25%, diffuse HK, trivial AI, mild LAE, mild RAE, mildly dilated RV   ASSESSMENT:     1. Cardiomyopathy ([place])   2. Chronic systolic CHF (congestive heart failure) ([place])   3. HLD (hyperlipidemia)   4. Myxoid liposarcoma ([place])   5. History of CVA (cerebrovascular accident)     PLAN:     In order of problems listed above:  1. DCM - LHC with no CAD.  This is a non-ischemic CM.  He will continue on beta blocker, ARB. We'll eventually try to add spironolactone. He will need follow-up echocardiogram at some point to reassess LV function. If EF remains <35%, refer to  EP for consideration of ICD.  2. Chronic systolic CHF - NYHA 1. Continue beta blocker, ARB, Lasix. Increase carvedilol to 9.375 mg 2 times a day. Check BMET today. If renal function and potassium remain stable, could consider changing Lasix to 20 mg, discontinuing potassium and starting on spironolactone 12.5 mg daily.  I have given him a note to return to work.  He works at an [employer].   3. HL - Continue statin.    4. Lipomatous Tumor - Given results of LHC demonstrating no CAD, he does not require further cardiac workup.  He may proceed with his planned procedure at acceptable risk.  He will need close attention to volume status during the perioperative period.  Our service is available as needed.   5. Hx of CVA - Continue ASA, statin. FU with Neuro as planned.    Medication Adjustments/Labs and Tests Ordered: Current medicines are reviewed at length with the patient today.  Concerns regarding medicines are outlined above.  Medication changes, Labs and Tests ordered today are outlined in the Patient Instructions noted below. Patient Instructions  Medication Instructions:  1. INCREASE COREG TO 9.375 MG TWICE DAILY (THIS WILL BE 1 AND 1/2 TABS TWICE DAILY); NEW RX SENT IN TODAY 2. A REFILL FOR LOSARTAN AND ATORVASTATIN HAVE BEEN SENT IN Labwork: TODAY  BMET Testing/Procedures: NONE Follow-Up: [employer], [facility] 01/13/16 @ 3:45  Any Other Special Instructions Will Be Listed Below (If Applicable). If you need a refill on your cardiac medications before your next appointment, please call your pharmacy.    Signed, Richardson Dopp, PA-C  12/13/2015 10:13 AM    [facility] [place], [place], [place]  [address] Phone: [phone]; Fax: [phone]

## 2015-12-13 ENCOUNTER — Encounter: Payer: Self-pay | Admitting: Physician Assistant

## 2015-12-13 ENCOUNTER — Ambulatory Visit (INDEPENDENT_AMBULATORY_CARE_PROVIDER_SITE_OTHER): Payer: Self-pay | Admitting: Physician Assistant

## 2015-12-13 ENCOUNTER — Telehealth: Payer: Self-pay | Admitting: *Deleted

## 2015-12-13 VITALS — BP 110/80 | HR 78 | Ht 73.0 in | Wt 251.8 lb

## 2015-12-13 DIAGNOSIS — E785 Hyperlipidemia, unspecified: Secondary | ICD-10-CM

## 2015-12-13 DIAGNOSIS — Z8673 Personal history of transient ischemic attack (TIA), and cerebral infarction without residual deficits: Secondary | ICD-10-CM

## 2015-12-13 DIAGNOSIS — I5022 Chronic systolic (congestive) heart failure: Secondary | ICD-10-CM

## 2015-12-13 DIAGNOSIS — I429 Cardiomyopathy, unspecified: Secondary | ICD-10-CM

## 2015-12-13 DIAGNOSIS — C499 Malignant neoplasm of connective and soft tissue, unspecified: Secondary | ICD-10-CM

## 2015-12-13 LAB — BASIC METABOLIC PANEL
BUN: 12 mg/dL (ref 7–25)
CO2: 26 mmol/L (ref 20–31)
Calcium: 9.9 mg/dL (ref 8.6–10.3)
Chloride: 101 mmol/L (ref 98–110)
Creat: 1.19 mg/dL (ref 0.60–1.35)
Glucose, Bld: 103 mg/dL — ABNORMAL HIGH (ref 65–99)
Potassium: 4.3 mmol/L (ref 3.5–5.3)
Sodium: 139 mmol/L (ref 135–146)

## 2015-12-13 MED ORDER — LOSARTAN POTASSIUM 25 MG PO TABS: ORAL_TABLET | ORAL | Status: DC

## 2015-12-13 MED ORDER — CARVEDILOL 6.25 MG PO TABS: 9.3750 mg | ORAL_TABLET | Freq: Two times a day (BID) | ORAL | Status: DC

## 2015-12-13 MED ORDER — ATORVASTATIN CALCIUM 40 MG PO TABS: 40.0000 mg | ORAL_TABLET | Freq: Every day | ORAL | Status: DC

## 2015-12-13 NOTE — Telephone Encounter (Signed)
Lmtcb for lab results 

## 2015-12-13 NOTE — Patient Instructions (Addendum)
Medication Instructions:  1. INCREASE COREG TO 9.375 MG TWICE DAILY (THIS WILL BE 1 AND 1/2 TABS TWICE DAILY); NEW RX SENT IN TODAY 2. A REFILL FOR LOSARTAN AND ATORVASTATIN HAVE BEEN SENT IN Labwork: TODAY BMET Testing/Procedures: NONE Follow-Up: [facility] [employer], [facility] 01/13/16 @ 3:45  Any Other Special Instructions Will Be Listed Below (If Applicable). If you need a refill on your cardiac medications before your next appointment, please call your pharmacy.

## 2015-12-14 ENCOUNTER — Ambulatory Visit (HOSPITAL_BASED_OUTPATIENT_CLINIC_OR_DEPARTMENT_OTHER): Payer: Self-pay | Admitting: General Surgery

## 2015-12-14 ENCOUNTER — Encounter: Payer: Self-pay | Admitting: General Surgery

## 2015-12-14 VITALS — BP 130/85 | HR 80 | Temp 98.7°F | Resp 18 | Ht 73.0 in | Wt 232.6 lb

## 2015-12-14 DIAGNOSIS — I429 Cardiomyopathy, unspecified: Secondary | ICD-10-CM

## 2015-12-14 DIAGNOSIS — I5022 Chronic systolic (congestive) heart failure: Secondary | ICD-10-CM

## 2015-12-14 DIAGNOSIS — Z8673 Personal history of transient ischemic attack (TIA), and cerebral infarction without residual deficits: Secondary | ICD-10-CM

## 2015-12-14 DIAGNOSIS — C499 Malignant neoplasm of connective and soft tissue, unspecified: Secondary | ICD-10-CM

## 2015-12-14 DIAGNOSIS — C4922 Malignant neoplasm of connective and soft tissue of left lower limb, including hip: Secondary | ICD-10-CM

## 2015-12-14 DIAGNOSIS — E785 Hyperlipidemia, unspecified: Secondary | ICD-10-CM

## 2015-12-14 NOTE — Progress Notes (Signed)
The pleasure of meeting with Austin Holmes and his mother today following his cardiac evaluation. The final assessment was that of cardiomyopathy with chronic systolic congestive heart failure and associated hyperlipidemia with a history of cerebrovascular accident for which she has recovered fully. In their plan from the encounter dated 12/13/2015 they felt he did not need further cardiac workup and that he may proceed with his planned procedure at an acceptable risk. They note he will need close attention to volume status or in the perioperative period and that they would be available to assist as needed. I reviewed these findings with the patient and his mother, and have recommended that we move forward with a resection of the left thigh atypical lipomatous tumor. We reviewed how the procedure be performed including the risk and benefits of the procedure. He understands that this would likely require a drain in the postoperative period. He also understands the possibility of ligation of the saphenous vein with potential for postoperative long-term leg swelling. We also reviewed the risk of numbness and tingling of the thigh that could be permanent. He also understands the possibility of involved margins and local recurrence. The decision regarding adjuvant radiation therapy would have to await final resection margin status. He understands we are recommending moving forward with surgery, however he has issues requiring him to return to work and has asked that he have the opportunity to speak with his supervisor to see if he can have time off for the surgery. He understands that if we delay surgery more than 3 months from the time of his last imaging we would want to re-image his leg with a repeat MRI scan. We will await to hear from the patient in choosing a date for surgery.

## 2015-12-14 NOTE — Patient Instructions (Addendum)
Please call us at [phone] Lenna Sciara, NP) to let us know when you would like to proceed with removal of the mass from your thigh.    When a date has been decided, you will receive a phone call from [facility] to come in for a pre-operative appointment.   We will also reach out to cardiology and let you know when to stop taking your aspirin.

## 2016-01-03 ENCOUNTER — Telehealth: Payer: Self-pay

## 2016-01-03 NOTE — Telephone Encounter (Signed)
Orders received from [place] to contact the patient to see if he has been scheduled to see his neurologist for medical release for surgery with Dr Eddie Dibbles. No answer , left a detailed voice message with call back requested ,our office contact information was given.

## 2016-01-05 ENCOUNTER — Ambulatory Visit: Payer: Self-pay | Admitting: Unknown Physician Specialty

## 2016-01-06 ENCOUNTER — Telehealth: Payer: Self-pay | Admitting: Gynecologic Oncology

## 2016-01-06 NOTE — Telephone Encounter (Signed)
Attempted to call patient to check with him about his wishes to proceed with surgery for his thigh mass or repeat imaging in several months.  Left message asking him to please call the office with an update.

## 2016-01-11 ENCOUNTER — Other Ambulatory Visit: Payer: Self-pay | Admitting: Physician Assistant

## 2016-01-13 ENCOUNTER — Ambulatory Visit: Payer: Self-pay | Admitting: Physician Assistant

## 2016-01-25 ENCOUNTER — Telehealth: Payer: Self-pay | Admitting: *Deleted

## 2016-01-25 NOTE — Telephone Encounter (Signed)
No vm

## 2016-01-26 ENCOUNTER — Ambulatory Visit: Payer: Self-pay | Admitting: Nurse Practitioner

## 2016-01-27 ENCOUNTER — Encounter: Payer: Self-pay | Admitting: Nurse Practitioner

## 2016-02-09 ENCOUNTER — Ambulatory Visit: Payer: Self-pay | Admitting: Unknown Physician Specialty

## 2016-02-15 ENCOUNTER — Telehealth: Payer: Self-pay

## 2016-02-15 NOTE — Telephone Encounter (Signed)
Orders received to contact from [place] to contact to touch base with patient in regards to scheduling surgery as discussed during last visit on Dec 14, 2015 with Dr Eddie Dibbles. Attempted to contact the patient to see if and when he would like to proceed with surgery. No answer , left a detailed message with call back requested to assist with scheduling a resection of the left thigh atypical lipomatous tumor. Re imaging is also recommended if the patient delays surgery more than 3 months from the time of his last imaging,  re-image of his left leg with a repeat MRI scan. Also contacted the patient's mother [name] requesting she update Jacqulynn Cadet with our request . [name] states she will be seeing her son tonight and will have him contact our office with his discission to follow though now or a later date. [name] states that her son is fearful of losing his job ,due to frequent absence from work. [name] was informed that Jacqulynn Cadet could apply for FMLA , [name] states she will inform Jacqulynn Cadet . No further questions asked at this time , will await for the patient to return our call.

## 2016-02-22 NOTE — Telephone Encounter (Signed)
Mother called to return call from [place]. Robin not available. Will forward message.

## 2016-02-29 ENCOUNTER — Ambulatory Visit: Payer: Self-pay | Admitting: General Surgery

## 2016-03-10 ENCOUNTER — Telehealth: Payer: Self-pay

## 2016-03-10 NOTE — Telephone Encounter (Signed)
Attempted to contact the patient and his emergency contact his mother Austin Holmes to reschedule Austin Holmes's missed appointment with Dr Eddie Dibbles on February 29, 2016. No answer ,left a detailed message with call back requested, call back information provided. Updated Melissa Cross, APNP of not being able to reach the patient or his emergency contact person .

## 2016-03-13 ENCOUNTER — Ambulatory Visit: Payer: Self-pay | Admitting: Neurology

## 2016-03-14 ENCOUNTER — Encounter: Payer: Self-pay | Admitting: Neurology

## 2016-03-16 NOTE — Telephone Encounter (Signed)
Attempted to contact the multiple times with no response from the patient , spoke with [name] mother [name]. Writer inquiring if Austin Holmes plans to proceed with the surgery as discussed with Dr Derry Skill states she is not sure what her son plans to do ,but will have him contact our office with his discission . No further questions or concerns voiced by Ms [name], APNP updated.

## 2016-04-11 ENCOUNTER — Encounter: Payer: Self-pay | Admitting: Gynecologic Oncology

## 2016-04-12 ENCOUNTER — Other Ambulatory Visit: Payer: Self-pay | Admitting: *Deleted

## 2016-04-12 MED ORDER — POTASSIUM CHLORIDE CRYS ER 20 MEQ PO TBCR
20.0000 meq | EXTENDED_RELEASE_TABLET | Freq: Every day | ORAL | 7 refills | Status: DC
Start: 2016-04-12 — End: 2016-05-25

## 2016-05-08 ENCOUNTER — Telehealth: Payer: Self-pay | Admitting: *Deleted

## 2016-05-08 ENCOUNTER — Telehealth: Payer: Self-pay

## 2016-05-08 ENCOUNTER — Other Ambulatory Visit: Payer: Self-pay | Admitting: Gynecologic Oncology

## 2016-05-08 DIAGNOSIS — C499 Malignant neoplasm of connective and soft tissue, unspecified: Secondary | ICD-10-CM

## 2016-05-08 NOTE — Telephone Encounter (Signed)
Austin Holmes, APNP updated receiving triage call from [facility] ,RN with patient calling with pain to leg and would like to make an appointment . Patient requesting his mother be contacted to schedule appointment.

## 2016-05-08 NOTE — Telephone Encounter (Signed)
Call from Pt's mother Austin Holmes ([phone]) states " Austin Holmes has had right leg pain x 7days. He needs to schedule surgery and see someone about his leg." Austin Holmes denied pt taking any medication for pain nor does pt have any rx pain meds., denied skin warm to touch, able to walk, pt no showed last appt 8/1. Per last MD note 5/16, MD recommending surgery, pt will need re-imaging of leg , repeat MRI. Discussed with pt mother pt will need a follow up with MD, possible re-imaging prior to a surgery.  Mother requested pt be seen today regarding leg pain.  Message forward to Austin John, NP Please call Austin Holmes [phone] regarding pt appts, pt is unable to drive due to past stroke.

## 2016-05-09 ENCOUNTER — Encounter: Payer: Self-pay | Admitting: General Surgery

## 2016-05-09 ENCOUNTER — Telehealth: Payer: Self-pay | Admitting: Gynecologic Oncology

## 2016-05-09 ENCOUNTER — Ambulatory Visit (HOSPITAL_BASED_OUTPATIENT_CLINIC_OR_DEPARTMENT_OTHER): Payer: Managed Care, Other (non HMO) | Admitting: General Surgery

## 2016-05-09 VITALS — BP 129/71 | HR 84 | Temp 98.2°F | Resp 18 | Ht 73.0 in | Wt 237.3 lb

## 2016-05-09 DIAGNOSIS — M62469 Contracture of muscle, unspecified lower leg: Secondary | ICD-10-CM | POA: Diagnosis not present

## 2016-05-09 DIAGNOSIS — C499 Malignant neoplasm of connective and soft tissue, unspecified: Secondary | ICD-10-CM

## 2016-05-09 DIAGNOSIS — C4922 Malignant neoplasm of connective and soft tissue of left lower limb, including hip: Secondary | ICD-10-CM

## 2016-05-09 MED ORDER — OXYCODONE-ACETAMINOPHEN 5-325 MG PO TABS: 1.0000 | ORAL_TABLET | ORAL | 0 refills | Status: DC | PRN

## 2016-05-09 NOTE — Patient Instructions (Addendum)
Plan to have an MRI and chest xray on Thursday, October 12 with 12:45pm arrival for 1 pm appointment at [place].  You will also need to have a chest xray which can be done before or after your MRI.  We will also call you with information about an appointment to get back in with the neurologist as well to obtain surgical clearance.    Plan to take Advil or Motrin throughout the day with food.  At bedtime, you can take the percocet for pain.  Do not take the percocet and drive or operate machinery or work.

## 2016-05-09 NOTE — Telephone Encounter (Signed)
Patient's mother [name] cell # [phone] contacted with appointment time for today at 1 PM . Also informed [name] that Dr Rolanda Jay is recommending re-imaging with a chest Xray and MRI and a follow up MD visit to discuss findings and surgery . [name] states understanding , [name] is also aware that Jacqulynn Cadet needs to bring proof of insurance with him today. Patient states his pain is currently a 7/10 to his right leg , is able to ambulate with mild discomfort. Patient and his mother denies questions at this time.

## 2016-05-09 NOTE — Progress Notes (Signed)
HPI: I had the pleasure of meeting with Austin Holmes. We had previously seen him in May after he completed preoperative clearance for a planned resection of a left proximal atypical lipomatous tumor. Unfortunately, he did not follow-up as scheduled. We were unable to reach him even through his mother. He now presents with complaint of tightness in the leg after prolonged standing that interrupts his sleep. He denies any numbness, tingling, buckling of the leg or any new symptoms otherwise.  Physical examination was focused on his lower extremities and his examination is essentially unchanged. He is neurovascularly intact with 5 over 5 strength throughout his lower extremities with no evidence of clubbing cyanosis or edema.  Impression: Left proximal thigh atypical lipomatous tumor with prior history of stroke and cardiomyopathy with no residual neurologic deficits.  Plan: We will obtain updated MRI scan of the left thigh as well as a 2 view chest x-ray to rule out metastatic disease. We will also arrange for him to have an updated clearance from neurology and cardiology prior to scheduling surgery. We'll provide him pain medication but I have encouraged him to use nonsteroidal anti-inflammatory medications during the day and reserve narcotic pain medications for evenings. He was advised to take all pain medications with food on his stomach and was counseled about the risk regarding ulcers. He also understands to minimize his use of narcotic pain medication and not to drive while on pain medication. He also understands the risk of constipation and he will manage this with fiber. We will see him back after he is completed his imaging and clearances.

## 2016-05-09 NOTE — Telephone Encounter (Signed)
Left message with the patient about neurologist appt for tomorrow.  Advised to please call our office to confirm.  Called and spoke with the patient's mother who was with the patient.  Informed of appt for tomorrow with Dr. Leonie Man.  Address and phone number given.  Stating they would plan to attend.  Advised since he no showed last appt they may not reschedule him if he does not show to this appt and his surgery with Dr. Rolanda Jay would be post-poned.  Mother stating they will be at the appt tomorrow.   Appt scheduled for tomorrow with 2 pm arrival for 2:30pm appt with Dr. Leonie Man to obtain neuro clearance for surgery and recommendations for aspirin pre-op.

## 2016-05-10 ENCOUNTER — Telehealth: Payer: Self-pay | Admitting: Gynecologic Oncology

## 2016-05-10 ENCOUNTER — Ambulatory Visit (INDEPENDENT_AMBULATORY_CARE_PROVIDER_SITE_OTHER): Payer: Managed Care, Other (non HMO) | Admitting: Neurology

## 2016-05-10 ENCOUNTER — Encounter: Payer: Self-pay | Admitting: Neurology

## 2016-05-10 VITALS — BP 116/75 | HR 75 | Ht 73.0 in | Wt 237.6 lb

## 2016-05-10 DIAGNOSIS — I639 Cerebral infarction, unspecified: Secondary | ICD-10-CM

## 2016-05-10 NOTE — Progress Notes (Signed)
[facility] [address] [place]. [place] [address] [phone]       OFFICE CONSULT NOTE  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 Kayren Holmes but did not keep 2 separate appointments and canceled. He was recently seen by Dr. Rolanda Holmes surgical oncologist for elective removal of left thigh tumor and hence was referred to me for neurological clearance. History is obtained from the patient, wife and review of hospital consultation note by Dr. Doy Holmes at [facility] on 11/01/05/2017. He presented with difficulty walking and headache with regard versus the day prior to admission. Initial CT scan that was unremarkable but MRI scan showed patchy embolic infarct involving medial left temporal lobe and extending into the thalamus. MRA showed occlusion of the left posterior cerebral artery just beyond the P1 segment. Transverse he Echo showed ejection fraction of 20-25% but no definite clot. LDL cholesterol was borderline at 104 mg percent. Patient had heme-negative panel, ESR, protein C&S, lupus anticoagulant, factor V Leiden, antithrombin III, homocystine and anticardiolipin antibodies all of which were normal. Hemoglobin A1c was 5.0. Patient subsequently had an outpatient TEE performed on 4/13 2017 at [facility] which confirmed a low ejection fraction but did not show any definite intra-atrial clot or PFO. Patient was seen by Dr. Joylene Holmes from rehabilitation and was referred to me but he twice no showed for his appointments in July and August 2017. Patient also has incidental left thigh lipomatous tumor for which he requires surgical excision and saw Dr. Rolanda Holmes who referred the patient back to me for neurological clearance  for his surgery. Patient states his done well since his stroke. his physical deficits his back to his baseline and able to work. He is tolerating aspirin well without bruising or bleeding. He has no complaints today. He is eating healthy and exercising in fact has lost about 20 pounds. ROS:   14 system review of systems is positive for  leg swelling, aching muscles, chills and all other systems negative  PMH:  Past Medical History:  Diagnosis Date  . Chronic systolic CHF (congestive heart failure) ([place])    a. Echo 4/17 - EF 20-25%, diffuse HK, trivial AI, mild LAE, mild RAE, mildly dilated RV  . DCM (dilated cardiomyopathy) ([place])   . HLD (hyperlipidemia)   . Myxoid liposarcoma (HCC)    L thigh  . Stroke [facility])     Social History:  Social History   Social History  . Marital status: Single    Spouse name: N/A  . Number of children: N/A  . 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 Smoking status: Never Smoker  . Smokeless tobacco: Never Used  . Alcohol use 0.0 oz/week     Comment: occasionally drinks beer  . Drug use: No  . Sexual activity: Not on file   Other Topics Concern  . Not on file   Social History Narrative   Lives with mother Austin Holmes   Caffeine use: Soda daily    Medications:   Current Outpatient Prescriptions on File Prior to Visit  Medication Sig Dispense Refill  . aspirin EC 325 MG EC tablet Take 1 tablet (325 mg total)  by mouth daily. 100 tablet 0  . atorvastatin (LIPITOR) 40 MG tablet Take 1 tablet (40 mg total) by mouth daily at 6 PM. 30 tablet 11  . carvedilol (COREG) 6.25 MG tablet Take 1.5 tablets (9.375 mg total) by mouth 2 (two) times daily. 90 tablet 6  . furosemide (LASIX) 40 MG tablet TAKE ONE TABLET BY MOUTH ONCE DAILY 30 tablet 10  . losartan (COZAAR) 25 MG tablet TAKE 1 TABLET (25 MG TOTAL) BY MOUTH DAILY. 30 tablet 11  . oxyCODONE-acetaminophen (PERCOCET/ROXICET) 5-325 MG tablet Take  1-2 tablets by mouth every 4 (four) hours as needed for severe pain. 30 tablet 0  . potassium chloride SA (K-DUR,KLOR-CON) 20 MEQ tablet Take 1 tablet (20 mEq total) by mouth daily. 30 tablet 7   No current facility-administered medications on file prior to visit.     Allergies:  No Known Allergies  Physical Exam General: well developed, well nourished middle aged African-American male, seated, in no evident distress Head: head normocephalic and atraumatic.   Neck: supple with no carotid or supraclavicular bruits Cardiovascular: regular rate and rhythm, no murmurs Musculoskeletal: no deformity Skin:  no rash/petichiae Vascular:  Normal pulses all extremities  Neurologic Exam Mental Status: Awake and fully alert. Oriented to place and time. Recent and remote memory intact. Attention span, concentration and fund of knowledge appropriate. Mood and affect appropriate.  Cranial Nerves: Fundoscopic exam reveals sharp disc margins. Pupils equal, briskly reactive to light. Extraocular movements full without nystagmus. Visual fields full to confrontation. Hearing intact. Facial sensation intact. Face, tongue, palate moves normally and symmetrically.  Motor: Normal bulk and tone. Normal strength in all tested extremity muscles. Sensory.: intact to touch , pinprick , position and vibratory sensation.  Coordination: Rapid alternating movements normal in all extremities. Finger-to-nose and heel-to-shin performed accurately bilaterally. Gait and Station: Arises from chair without difficulty. Stance is normal. Gait demonstrates normal stride length and balance . Able to heel, toe and tandem walk without difficulty.  Reflexes: 1+ and symmetric. Toes downgoing.   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 He is clinically doing quite well with no deficits. He also has lipomatous tumor in his left thigh for which he plans to have elective surgical removal by Dr. Rolanda Holmes    PLAN: I had a long d/w patient and his wife about his recent cryptogenic stroke, risk for recurrent stroke/TIAs, personally independently reviewed imaging studies and stroke evaluation results and answered questions.Continue aspirin 325 mg daily  for secondary stroke prevention and maintain strict control of hypertension with blood pressure goal below 130/90, diabetes with hemoglobin A1c goal below 6.5% and lipids with LDL cholesterol goal below 70 mg/dL. I also advised the patient to eat a healthy diet with plenty of whole grains, cereals, fruits and vegetables, exercise regularly and maintain ideal body weight .greater than 50% time during this 45 minute consultation visit was spent on counseling and coordination of care about his stroke risk, prevention and treatment .the patient is neurologically cleared to undergo surgical removal of his lipoma and will have to hold aspirin for 3-5 days prior to surgery with a small but acceptable but periprocedural  risk of stroke/TIA if acceptable to the patient. Followup in the future with me in  6 months or call earlier if necessary Antony Contras, MD  [facility] [address] [place], [place] [address]  Phone [phone] Fax [phone] Note: This document was prepared with digital dictation and possible smart phrase technology. Any transcriptional errors that result from this process are unintentional.

## 2016-05-10 NOTE — Patient Instructions (Signed)
I had a long d/w patient and his wife about his recent cryptogenic stroke, risk for recurrent stroke/TIAs, personally independently reviewed imaging studies and stroke evaluation results and answered questions.Continue aspirin 325 mg daily  for secondary stroke prevention and maintain strict control of hypertension with blood pressure goal below 130/90, diabetes with hemoglobin A1c goal below 6.5% and lipids with LDL cholesterol goal below 70 mg/dL. I also advised the patient to eat a healthy diet with plenty of whole grains, cereals, fruits and vegetables, exercise regularly and maintain ideal body weight Followup in the future with me in  6 months or call earlier if necessary Stroke Prevention Some medical conditions and behaviors are associated with an increased chance of having a stroke. You may prevent a stroke by making healthy choices and managing medical conditions. HOW CAN I REDUCE MY RISK OF HAVING A STROKE?   Stay physically active. Get at least 30 minutes of activity on most or all days.  Do not smoke. It may also be helpful to avoid exposure to secondhand smoke.  Limit alcohol use. Moderate alcohol use is considered to be:  No more than 2 drinks per day for men.  No more than 1 drink per day for nonpregnant women.  Eat healthy foods. This involves:  Eating 5 or more servings of fruits and vegetables a day.  Making dietary changes that address high blood pressure (hypertension), high cholesterol, diabetes, or obesity.  Manage your cholesterol levels.  Making food choices that are high in fiber and low in saturated fat, trans fat, and cholesterol may control cholesterol levels.  Take any prescribed medicines to control cholesterol as directed by your health care provider.  Manage your diabetes.  Controlling your carbohydrate and sugar intake is recommended to manage diabetes.  Take any prescribed medicines to control diabetes as directed by your health care  provider.  Control your hypertension.  Making food choices that are low in salt (sodium), saturated fat, trans fat, and cholesterol is recommended to manage hypertension.  Ask your health care provider if you need treatment to lower your blood pressure. Take any prescribed medicines to control hypertension as directed by your health care provider.  If you are 79-25 years of age, have your blood pressure checked every 3-5 years. If you are 67 years of age or older, have your blood pressure checked every year.  Maintain a healthy weight.  Reducing calorie intake and making food choices that are low in sodium, saturated fat, trans fat, and cholesterol are recommended to manage weight.  Stop drug abuse.  Avoid taking birth control pills.  Talk to your health care provider about the risks of taking birth control pills if you are over 93 years old, smoke, get migraines, or have ever had a blood clot.  Get evaluated for sleep disorders (sleep apnea).  Talk to your health care provider about getting a sleep evaluation if you snore a lot or have excessive sleepiness.  Take medicines only as directed by your health care provider.  For some people, aspirin or blood thinners (anticoagulants) are helpful in reducing the risk of forming abnormal blood clots that can lead to stroke. If you have the irregular heart rhythm of atrial fibrillation, you should be on a blood thinner unless there is a good reason you cannot take them.  Understand all your medicine instructions.  Make sure that other conditions (such as anemia or atherosclerosis) are addressed. SEEK IMMEDIATE MEDICAL CARE IF:   You have sudden weakness or numbness  of the face, arm, or leg, especially on one side of the body.  Your face or eyelid droops to one side.  You have sudden confusion.  You have trouble speaking (aphasia) or understanding.  You have sudden trouble seeing in one or both eyes.  You have sudden trouble  walking.  You have dizziness.  You have a loss of balance or coordination.  You have a sudden, severe headache with no known cause.  You have new chest pain or an irregular heartbeat. Any of these symptoms may represent a serious problem that is an emergency. Do not wait to see if the symptoms will go away. Get medical help at once. Call your local emergency services (911 in [place] Do not drive yourself to the hospital.   This information is not intended to replace advice given to you by your health care provider. Make sure you discuss any questions you have with your health care provider.   Document Released: 08/24/2004 Document Revised: 08/07/2014 Document Reviewed: 01/17/2013 Elsevier Interactive Patient Education [employer].

## 2016-05-11 ENCOUNTER — Ambulatory Visit (HOSPITAL_COMMUNITY): Admission: RE | Admit: 2016-05-11 | Payer: Managed Care, Other (non HMO) | Source: Ambulatory Visit

## 2016-05-15 ENCOUNTER — Telehealth: Payer: Self-pay

## 2016-05-15 NOTE — Telephone Encounter (Signed)
Orders received from [facility] to contact the patient's mother to update with appointments . MRI at 7 AM , Liliane Shi, PA-C Cardiology 11:45 AM and Dr Eddie Dibbles at 2 PM on Tuesday October 17 , 2017 . Contacted the patient's mother Austin Holmes . [name] agreed to date and times of appointments. Melissa Cross, APNP updated .

## 2016-05-16 ENCOUNTER — Ambulatory Visit (INDEPENDENT_AMBULATORY_CARE_PROVIDER_SITE_OTHER): Payer: Managed Care, Other (non HMO) | Admitting: Physician Assistant

## 2016-05-16 ENCOUNTER — Ambulatory Visit (HOSPITAL_COMMUNITY)
Admission: RE | Admit: 2016-05-16 | Discharge: 2016-05-16 | Disposition: A | Discharge status: Home / Self Care | Payer: Managed Care, Other (non HMO) | Source: Ambulatory Visit | Attending: Gynecologic Oncology | Admitting: Gynecologic Oncology

## 2016-05-16 ENCOUNTER — Ambulatory Visit (HOSPITAL_BASED_OUTPATIENT_CLINIC_OR_DEPARTMENT_OTHER): Payer: Managed Care, Other (non HMO) | Admitting: General Surgery

## 2016-05-16 ENCOUNTER — Ambulatory Visit (HOSPITAL_COMMUNITY): Admission: RE | Admit: 2016-05-16 | Payer: Managed Care, Other (non HMO) | Source: Ambulatory Visit

## 2016-05-16 ENCOUNTER — Encounter: Payer: Self-pay | Admitting: General Surgery

## 2016-05-16 ENCOUNTER — Encounter: Payer: Self-pay | Admitting: Physician Assistant

## 2016-05-16 VITALS — BP 135/80 | HR 63 | Temp 98.3°F | Resp 18 | Wt 234.3 lb

## 2016-05-16 VITALS — BP 136/90 | HR 72 | Ht 73.0 in | Wt 233.1 lb

## 2016-05-16 DIAGNOSIS — Z01810 Encounter for preprocedural cardiovascular examination: Secondary | ICD-10-CM | POA: Diagnosis not present

## 2016-05-16 DIAGNOSIS — I428 Other cardiomyopathies: Secondary | ICD-10-CM

## 2016-05-16 DIAGNOSIS — C499 Malignant neoplasm of connective and soft tissue, unspecified: Secondary | ICD-10-CM

## 2016-05-16 DIAGNOSIS — I5022 Chronic systolic (congestive) heart failure: Secondary | ICD-10-CM

## 2016-05-16 DIAGNOSIS — Z8673 Personal history of transient ischemic attack (TIA), and cerebral infarction without residual deficits: Secondary | ICD-10-CM

## 2016-05-16 DIAGNOSIS — E7800 Pure hypercholesterolemia, unspecified: Secondary | ICD-10-CM

## 2016-05-16 DIAGNOSIS — M79605 Pain in left leg: Secondary | ICD-10-CM

## 2016-05-16 DIAGNOSIS — I517 Cardiomegaly: Secondary | ICD-10-CM | POA: Insufficient documentation

## 2016-05-16 DIAGNOSIS — C4922 Malignant neoplasm of connective and soft tissue of left lower limb, including hip: Secondary | ICD-10-CM

## 2016-05-16 MED ORDER — GADOBENATE DIMEGLUMINE 529 MG/ML IV SOLN
20.0000 mL | Freq: Once | INTRAVENOUS | Status: AC | PRN
  Administered 2016-05-16: 20 mL via INTRAVENOUS

## 2016-05-16 NOTE — Progress Notes (Signed)
Cardiology Office Note:    Date:  05/16/2016   ID:  [name], DOB [birth year], MRN [id number]  PCP:  [name] Patient  Cardiologist:  Dr. Casandra Doffing   Electrophysiologist:  N/a Surgeon: Dr. Rolanda Jay Neurologist: Dr. Leonie Man  Referring MD: No ref. provider found   Chief Complaint  Patient presents with  . Surgical Clearance.    History of Present Illness:    Austin Holmes is a 45 y.o. male with a hx of NICM, systolic CHF, LBBB, prior CVA, L thigh lipomatous tumor.  He presented to [facility] in 4/17 with L temopral and thalamic CVA.  Echo demonstrated EF 25-30%.  He was DC to inpatient rehab at [facility] and evaluated by Cardiology.  He was noted to have a large L thigh mass that would need eventual removal.  L heart cath in 5/17 demonstrated no CAD.  LVEDP was elevated and Lasix was started.  Last seen here by me in 5/17.  We cleared him for surgery at that time.  He was to FU for further HF medication titration and eventual repeat Echo to recheck his EF.   We have not seen him since that time. He was also lost to FU with the surgical oncologist and never had his L thigh mass removed. He was cleared by Dr. Leonie Man already for his surgery.    He is here alone today.  He has been doing very well since last seen.  The patient denies chest pain, shortness of breath, syncope, orthopnea, PND or significant pedal edema.  He exercises every day without difficulty.  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    Prior CV studies that were reviewed today include:    LHC 11/30/15  There is severe left ventricular systolic dysfunction. EF 25%  No significant coronary artery disease.  Elevated LVEDP. Medical therapy for nonischemic cardiomyopathy. Start Lasix and Potassium. Check BMet in one week. Limit sodium intake. No further cardiac testing needed prior to surgery.  TEE 11/11/15 EF 20-25%, diffuse HK, no apical clot, mild to  moderately reduced RVSF, no R-L shunt - No intracardiac source of thrombus identified.  Echo 11/03/15 EF 20-25%, diffuse HK, trivial AI, mild LAE, mild RAE, mildly dilated RV  Past Medical History:  Diagnosis Date  . Chronic systolic CHF (congestive heart failure) ([place])    a. Echo 4/17 - EF 20-25%, diffuse HK, trivial AI, mild LAE, mild RAE, mildly dilated RV  . DCM (dilated cardiomyopathy) ([place])   . HLD (hyperlipidemia)   . Myxoid liposarcoma ([facility])    L thigh  . Stroke [facility])     Past Surgical History:  Procedure Laterality Date  . CARDIAC CATHETERIZATION N/A 11/30/2015   Procedure: Left Heart Cath and Coronary Angiography;  Surgeon: Jettie Booze, MD;  Location: [facility] CV [facility];  Service: Cardiovascular;  Laterality: N/A;  . none    . TEE WITHOUT CARDIOVERSION N/A 11/11/2015   Procedure: TRANSESOPHAGEAL ECHOCARDIOGRAM (TEE);  Surgeon: Herminio Commons, MD;  Location: [facility] ENDOSCOPY;  Service: Cardiovascular;  Laterality: N/A;    Current Medications: Current Meds  Medication Sig  . aspirin EC 325 MG EC tablet Take 1 tablet (325 mg total) by mouth daily.  Marland Kitchen atorvastatin (LIPITOR) 40 MG tablet Take 1 tablet (40 mg total) by mouth daily at 6 PM.  . carvedilol (COREG) 6.25 MG tablet Take 1.5 tablets (9.375 mg total) by mouth 2 (two) times daily.  . furosemide (LASIX)  40 MG tablet TAKE ONE TABLET BY MOUTH ONCE DAILY  . losartan (COZAAR) 25 MG tablet TAKE 1 TABLET (25 MG TOTAL) BY MOUTH DAILY.  Marland Kitchen potassium chloride SA (K-DUR,KLOR-CON) 20 MEQ tablet Take 1 tablet (20 mEq total) by mouth daily.     Allergies:   Review of patient's allergies indicates no known allergies.   Social History   Social History  . Marital status: Single    Spouse name: N/A  . Number of children: N/A  . 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 Smoking status: Never Smoker  . Smokeless tobacco: Never Used  . Alcohol use 0.0 oz/week       Comment: occasionally drinks beer  . Drug use: No  . Sexual activity: Not Asked   Other Topics Concern  . None   Social History Narrative   Lives with mother [name]   Caffeine use: Soda daily     Family History:  The patient's family history includes Cancer in his father; Diabetes Mellitus II in his maternal grandfather and maternal grandmother.   ROS:   Please see the history of present illness.    ROS All other systems reviewed and are negative.   EKGs/Labs/Other Test Reviewed:    EKG:  EKG is  ordered today.  The ekg ordered today demonstrates NSR, HR 71, LBBB  Recent Labs: 11/04/2015: ALT 42 11/26/2015: Hemoglobin 13.8; Platelets 220 12/13/2015: BUN 12; Creat 1.19; Potassium 4.3; Sodium 139   Recent Lipid Panel    Component Value Date/Time   CHOL 202 (H) 11/02/2015 0557   TRIG 37 11/02/2015 0557   HDL 91 11/02/2015 0557   CHOLHDL 2.2 11/02/2015 0557   VLDL 7 11/02/2015 0557   LDLCALC 104 (H) 11/02/2015 0557     Physical Exam:    VS:  BP 136/90   Pulse 72   Ht 6\' 1"  (1.854 m)   Wt 233 lb 1.9 oz (105.7 kg)   SpO2 99%   BMI 30.76 kg/m     Wt Readings from Last 3 Encounters:  05/16/16 233 lb 1.9 oz (105.7 kg)  05/10/16 237 lb 9.6 oz (107.8 kg)  05/09/16 237 lb 4.8 oz (107.6 kg)     Physical Exam  Constitutional: He is oriented to person, place, and time. He appears well-developed and well-nourished. No distress.  HENT:  Head: Normocephalic and atraumatic.  Eyes: No scleral icterus.  Neck: No JVD present.  Cardiovascular: Normal rate, regular rhythm and normal heart sounds.   No murmur heard. Pulmonary/Chest: Effort normal. He has no wheezes. He has no rales.  Abdominal: Soft. There is no tenderness.  Musculoskeletal: He exhibits no edema.  Neurological: He is alert and oriented to person, place, and time.  Skin: Skin is warm and dry.  Psychiatric: He has a normal mood and affect.    ASSESSMENT:    1. Preoperative cardiovascular examination    2. NICM (nonischemic cardiomyopathy) ([place])   3. Chronic systolic CHF (congestive heart failure) ([place])   4. Pure hypercholesterolemia   5. History of CVA (cerebrovascular accident)    PLAN:    In order of problems listed above:  1. Surgical clearance - The patient does not have any unstable cardiac conditions.  Upon evaluation today, he can achieve 4 METs or greater without anginal symptoms.  He had a heart catheterization earlier this year without CAD.  His volume status is currently normal. He may proceed  with his noncardiac surgery and should be at acceptable risk.  However, we have not assessed his LVEF since his initial dx of NICM in 4/17.  I would like to re-evaluate his LV function prior to his surgery.  -  Arrange 2-D Echo   2. NICM - No CAD on cardiac cath earlier this year.  Continue current Rx.  Arrange FU Echo.  If EF < 35%, consider referral to EP for consideration of ICD.  3. Chronic systolic CHF - NYHA 1-2.  He is not volume overloaded.  Continue beta-blocker, angiotensin receptor blocker.  Recheck Echo as noted.  Close attention will need to be paid to his volume status throughout the perioperative period.  Our service is available as needed to assist.  4. HL - Continue statin.   5. Hx of CVA - FU with Neuro as planned.   Dispo - He lives closer to [place] and it would be more convenient for him to FU in our office there.  Will have him FU in 3 mos with either Dr. Rockey Situ, Dr. Fletcher Anon or Dr. Loel Dubonnet.    Medication Adjustments/Labs and Tests Ordered: Current medicines are reviewed at length with the patient today.  Concerns regarding medicines are outlined above.  Medication changes, Labs and Tests ordered today are outlined in the Patient Instructions noted below. Patient Instructions  Medication Instructions:  Your physician recommends that you continue on your current medications as directed. Please refer to the Current Medication list given to you  today.  Labwork: NONE  Testing/Procedures: Your physician has requested that you have an echocardiogram. Echocardiography is a painless test that uses sound waves to create images of your heart. It provides your doctor with information about the size and shape of your heart and how well your heart's chambers and valves are working. This procedure takes approximately one hour. There are no restrictions for this procedure. To be done before surgery next week; may be done at the hospital  Follow-Up: [place] Rockey Situ, DR. ARIDA OR DR. INGALL IN THE [place] OFFICE IN 3 MONTHS  Any Other Special Instructions Will Be Listed Below (If Applicable).  If you need a refill on your cardiac medications before your next appointment, please call your pharmacy.  Signed, Richardson Dopp, PA-C  05/16/2016 1:30 PM    [facility] [place], [place], [place]  [address] Phone: [phone]; Fax: [phone]

## 2016-05-16 NOTE — Progress Notes (Signed)
History of presenting illness Austin Holmes is a 45 year old gentleman who is well-known to our service when he first presented with a asymptomatic left proximal medial thigh soft tissue mass that was biopsied with the findings consistent with atypical lipomatous tumor and imaging that was consistent with that histologic appearance. He was lost to follow-up but now re-presents with pain in his right leg despite no significant changes with a mass in his left leg. He is now comfortable with moving forward with surgery. He has now completed clearance from the cardiology service (pending an echo cardiogram) as well as clearance from neurology with the recommendation to hold his anti-platelet therapy (aspirin) 3-5 days prior to surgery.  Physical examination: HEENT: Mucous membranes moist neck is supple no JVD, no supraclavicular or cervical adenopathy Lungs: Clear to auscultation and equal breath sounds CVS: Regular rate rhythm 2+ distal pulses  Abdomen: Soft nontender nondistended Extremities: Well-circumscribed minimally mobile, rubbery soft tissue mass in the proximal medial left thigh. No overlying skin changes, nontender. Approximately 10 x 8 cm.  DATA: MRI scan left femur with and without contrast dated 05/16/2016. I reviewed the films as well as the report with the patient and his mother. The impression is a stable 7 x 7.4 x 9.3 cm well-circumscribed mass with heterogeneous nodular enhancement located between the upper left such aureus and abductor longus muscle adjacent to the femoral neurovascular bundle. 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  Neurology clearance dated 05/10/2016. The plan included the statement that the patient is neurologically cleared to undergo surgical removal of the lipoma and will have to hold aspirin for 3-5 days prior to surgery with a small but acceptable risk of periprocedural stroke/TIA if acceptable with the patient.  Cardiac clearance  dated 05/16/2016 In the assessment and plan they report the patient does not have any unstable cardiac conditions. Upon evaluation today he can achieve 4 MG E Tis or greater without anginal symptoms. He may proceed with his noncardiac surgery and should be at acceptable risk. 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 I would like to reevaluate his LVEF prior to surgery.  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 He is now been cleared by neurology to move forward with surgery. He has been cleared by cardiology pending an echocardiogram to move forward with surgery.  Plan: At a long discussion with the patient and his mother. Reviewed the risk and benefits at length including bleeding, infection, seroma development, need for further surgery if a seroma develops. He also understands he may need a drain replacement if the seroma develops. We also reviewed at length the close proximity of the tumor to the neurovascular bundle. They understand that this may be associated with wrist to the neurovascular structures including the possibility of clot in the vein. Further, they understand that due to the low-grade nature of this lesion we will not be doing extensive muscle resection to achieve wide clear margins. They understand that if we encounter positive margins and the final resected specimen we will follow him expectantly. They understand and agree with the plan as outlined above. We have ordered a plain film radiograph the chest rule out metastatic disease. If that is negative and he is thereby cardiology following his echocardiogram we will move forward with surgery next week.         ;

## 2016-05-16 NOTE — Patient Instructions (Addendum)
Plan to have your echocardiogram as scheduled for this Thursday.                Preparing for your Surgery  Plan for surgery on May 24, 2016 with Dr. Suzanna Obey at [facility] will be scheduled for an excision of left thigh mass with JP drain placement.  Pre-operative Testing -You will receive a phone call from presurgical testing at [facility] to arrange for a pre-operative testing appointment before your surgery.  This appointment normally occurs one to two weeks before your scheduled surgery.   -Bring your insurance card, copy of an advanced directive if applicable, medication list  -At that visit, you will be asked to sign a consent for a possible blood transfusion in case a transfusion becomes necessary during surgery.  The need for a blood transfusion is rare but having consent is a necessary part of your care.     -STOP TAKING YOUR ASPIRIN 5 DAYS BEFORE SURGERY. Last dose on October 20, Friday.  Day Before Surgery at [place] will be advised to have nothing to eat or drink after midnight the evening before.    Your role in recovery Your role is to become active as soon as directed by your doctor, while still giving yourself time to heal.  Rest when you feel tired. You will be asked to do the following in order to speed your recovery:  - Cough and breathe deeply. This helps toclear and expand your lungs and can prevent pneumonia. You may be given a spirometer to practice deep breathing. A staff member will show you how to use the spirometer. - Do mild physical activity. Walking or moving your legs help your circulation and body functions return to normal. A staff member will help you when you try to walk and will provide you with simple exercises. Do not try to get up or walk alone the first time. - Actively manage your pain. Managing your pain lets you move in comfort. We will ask you to rate your pain on a scale of zero to 10. It is your  responsibility to tell your doctor or nurse where and how much you hurt so your pain can be treated.  Special Considerations -If you are diabetic, you may be placed on insulin after surgery to have closer control over your blood sugars to promote healing and recovery.  This does not mean that you will be discharged on insulin.  If applicable, your oral antidiabetics will be resumed when you are tolerating a solid diet.  -Your final pathology results from surgery should be available by the Friday after surgery and the results will be relayed to you when available.   Blood Transfusion Information WHAT IS A BLOOD TRANSFUSION? A transfusion is the replacement of blood or some of its parts. Blood is made up of multiple cells which provide different functions.  Red blood cells carry oxygen and are used for blood loss replacement.  White blood cells fight against infection.  Platelets control bleeding.  Plasma helps clot blood.  Other blood products are available for specialized needs, such as hemophilia or other clotting disorders. BEFORE THE TRANSFUSION  Who gives blood for transfusions?   You may be able to donate blood to be used at a later date on yourself (autologous donation).  Relatives can be asked to donate blood. This is generally not any safer than if you have received blood from a stranger. The same precautions are  taken to ensure safety when a relative's blood is donated.  Healthy volunteers who are fully evaluated to make sure their blood is safe. This is blood bank blood. Transfusion therapy is the safest it has ever been in the practice of medicine. Before blood is taken from a donor, a complete history is taken to make sure that person has no history of diseases nor engages in risky social behavior (examples are intravenous drug use or sexual activity with multiple partners). The donor's travel history is screened to minimize risk of transmitting infections, such as malaria.  The donated blood is tested for signs of infectious diseases, such as HIV and hepatitis. The blood is then tested to be sure it is compatible with you in order to minimize the chance of a transfusion reaction. If you or a relative donates blood, this is often done in anticipation of surgery and is not appropriate for emergency situations. It takes many days to process the donated blood. RISKS AND COMPLICATIONS Although transfusion therapy is very safe and saves many lives, the main dangers of transfusion include:   Getting an infectious disease.  Developing a transfusion reaction. This is an allergic reaction to something in the blood you were given. Every precaution is taken to prevent this. The decision to have a blood transfusion has been considered carefully by your caregiver before blood is given. Blood is not given unless the benefits outweigh the risks.  Bulb Drain Home Care A bulb drain consists of a thin rubber tube and a soft, round bulb that creates a gentle suction. The rubber tube is placed in the area where you had surgery. A bulb is attached to the end of the tube that is outside the body. The bulb drain removes excess fluid that normally builds up in a surgical wound after surgery. The color and amount of fluid will vary. Immediately after surgery, the fluid is bright red and is a little thicker than water. It may gradually change to a yellow or pink color and become more thin and water-like. When the amount decreases to about 1 or 2 tbsp in 24 hours, your health care provider will usually remove it. DAILY CARE  Keep the bulb flat (compressed) at all times, except while emptying it. The flatness creates suction. You can flatten the bulb by squeezing it firmly in the middle and then closing the cap.  Keep sites where the tube enters the skin dry and covered with a bandage (dressing).  Secure the tube 1-2 in (2.5-5.1 cm) below the insertion sites to keep it from pulling on your  stitches. The tube is stitched in place and will not slip out.  Secure the bulb as directed by your health care provider.  For the first 3 days after surgery, there usually is more fluid in the bulb. Empty the bulb whenever it becomes half full because the bulb does not create enough suction if it is too full. The bulb could also overflow. Write down how much fluid you remove each time you empty your drain. Add up the amount removed in 24 hours.  Empty the bulb at the same time every day once the amount of fluid decreases and you only need to empty it once a day. Write down the amounts and the 24-hour totals to give to your health care provider. This helps your health care provider know when the tubes can be removed. EMPTYING THE BULB DRAIN Before emptying the bulb, get a measuring cup, a piece of paper  and a pen, and wash your hands.  Gently run your fingers down the tube (stripping) to empty any drainage from the tubing into the bulb. This may need to be done several times a day to clear the tubing of clots and tissue.  Open the bulb cap to release suction, which causes it to inflate. Do not touch the inside of the cap.  Gently run your fingers down the tube (stripping) to empty any drainage from the tubing into the bulb.  Hold the cap out of the way, and pour fluid into the measuring cup.   Squeeze the bulb to provide suction.  Replace the cap.   Check the tape that holds the tube to your skin. If it is becoming loose, you can remove the loose piece of tape and apply a new one. Then, pin the bulb to your shirt.   Write down the amount of fluid you emptied out. Write down the date and each time you emptied your bulb drain. (If there are 2 bulbs, note the amount of drainage from each bulb and keep the totals separate. Your health care provider will want to know the total amounts for each drain and which tube is draining more.)   Flush the fluid down the toilet and wash your hands.    Call your health care provider once you have less than 2 tbsp of fluid collecting in the bulb drain every 24 hours. If there is drainage around the tube site, change dressings and keep the area dry. Cleanse around tube with sterile saline and place dry gauze around site. This gauze should be changed when it is soiled. If it stays clean and unsoiled, it should still be changed daily.  SEEK MEDICAL CARE IF:  Your drainage has a bad smell or is cloudy.   You have a fever.   Your drainage is increasing instead of decreasing.   Your tube fell out.   You have redness or swelling around the tube site.   You have drainage from a surgical wound.   Your bulb drain will not stay flat after you empty it.  MAKE SURE YOU:   Understand these instructions.  Will watch your condition.  Will get help right away if you are not doing well or get worse.   This information is not intended to replace advice given to you by your health care provider. Make sure you discuss any questions you have with your health care provider.   Document Released: 07/14/2000 Document Revised: 08/07/2014 Document Reviewed: 02/03/2015 Elsevier Interactive Patient Education [employer].

## 2016-05-16 NOTE — Patient Instructions (Addendum)
Medication Instructions:  Your physician recommends that you continue on your current medications as directed. Please refer to the Current Medication list given to you today.  Labwork: NONE  Testing/Procedures: Your physician has requested that you have an echocardiogram. Echocardiography is a painless test that uses sound waves to create images of your heart. It provides your doctor with information about the size and shape of your heart and how well your heart's chambers and valves are working. This procedure takes approximately one hour. There are no restrictions for this procedure. To be done before surgery next week; may be done at the hospital  Follow-Up: [place], DR. ARIDA OR DR. INGALL IN THE [facility] OFFICE IN 3 MONTHS  Any Other Special Instructions Will Be Listed Below (If Applicable).  If you need a refill on your cardiac medications before your next appointment, please call your pharmacy.

## 2016-05-18 ENCOUNTER — Other Ambulatory Visit: Payer: Self-pay

## 2016-05-18 ENCOUNTER — Ambulatory Visit (HOSPITAL_COMMUNITY): Payer: Managed Care, Other (non HMO) | Attending: Cardiovascular Disease

## 2016-05-18 DIAGNOSIS — I428 Other cardiomyopathies: Secondary | ICD-10-CM

## 2016-05-18 DIAGNOSIS — I509 Heart failure, unspecified: Secondary | ICD-10-CM | POA: Insufficient documentation

## 2016-05-18 DIAGNOSIS — I340 Nonrheumatic mitral (valve) insufficiency: Secondary | ICD-10-CM | POA: Insufficient documentation

## 2016-05-18 DIAGNOSIS — I071 Rheumatic tricuspid insufficiency: Secondary | ICD-10-CM | POA: Insufficient documentation

## 2016-05-18 DIAGNOSIS — I371 Nonrheumatic pulmonary valve insufficiency: Secondary | ICD-10-CM | POA: Insufficient documentation

## 2016-05-18 DIAGNOSIS — E785 Hyperlipidemia, unspecified: Secondary | ICD-10-CM | POA: Insufficient documentation

## 2016-05-18 DIAGNOSIS — G459 Transient cerebral ischemic attack, unspecified: Secondary | ICD-10-CM | POA: Diagnosis not present

## 2016-05-18 DIAGNOSIS — Z8673 Personal history of transient ischemic attack (TIA), and cerebral infarction without residual deficits: Secondary | ICD-10-CM | POA: Diagnosis not present

## 2016-05-18 LAB — ECHOCARDIOGRAM COMPLETE
Ao-asc: 31 cm
E decel time: 144 msec
E/e' ratio: 9.97
FS: 10 % — AB (ref 28–44)
IVS/LV PW RATIO, ED: 0.73
LA ID, A-P, ES: 43 mm
LA diam end sys: 43 mm
LA diam index: 1.87 cm/m2
LA vol A4C: 49.3 ml
LA vol index: 24.1 mL/m2
LA vol: 55.5 mL
LV E/e' medial: 9.97
LV E/e'average: 9.97
LV PW d: 11.3 mm — AB (ref 0.6–1.1)
LV e' LATERAL: 7.2 cm/s
LVOT SV: 51 mL
LVOT VTI: 17.8 cm
LVOT area: 2.84 cm2
LVOT diameter: 19 mm
LVOT peak vel: 88.7 cm/s
Lateral S' vel: 8.78 cm/s
MV Dec: 144
MV Peak grad: 2 mmHg
MV pk A vel: 64.5 m/s
MV pk E vel: 71.8 m/s
PV Reg grad dias: 6 mmHg
PV Reg vel dias: 123 cm/s
RV sys press: 28 mmHg
Reg peak vel: 224 cm/s
TDI e' lateral: 7.2
TDI e' medial: 2.53
TR max vel: 224 cm/s

## 2016-05-19 ENCOUNTER — Encounter (HOSPITAL_COMMUNITY): Payer: Self-pay

## 2016-05-19 ENCOUNTER — Telehealth: Payer: Self-pay | Admitting: *Deleted

## 2016-05-19 ENCOUNTER — Encounter (HOSPITAL_COMMUNITY)
Admission: RE | Admit: 2016-05-19 | Discharge: 2016-05-19 | Disposition: A | Discharge status: Home / Self Care | Payer: Managed Care, Other (non HMO) | Source: Ambulatory Visit | Attending: General Surgery | Admitting: General Surgery

## 2016-05-19 DIAGNOSIS — C499 Malignant neoplasm of connective and soft tissue, unspecified: Secondary | ICD-10-CM | POA: Insufficient documentation

## 2016-05-19 DIAGNOSIS — R269 Unspecified abnormalities of gait and mobility: Secondary | ICD-10-CM | POA: Diagnosis not present

## 2016-05-19 DIAGNOSIS — I429 Cardiomyopathy, unspecified: Secondary | ICD-10-CM | POA: Insufficient documentation

## 2016-05-19 DIAGNOSIS — Z8673 Personal history of transient ischemic attack (TIA), and cerebral infarction without residual deficits: Secondary | ICD-10-CM | POA: Diagnosis not present

## 2016-05-19 DIAGNOSIS — E785 Hyperlipidemia, unspecified: Secondary | ICD-10-CM | POA: Diagnosis not present

## 2016-05-19 DIAGNOSIS — I950 Idiopathic hypotension: Secondary | ICD-10-CM | POA: Diagnosis not present

## 2016-05-19 DIAGNOSIS — I5022 Chronic systolic (congestive) heart failure: Secondary | ICD-10-CM | POA: Diagnosis not present

## 2016-05-19 DIAGNOSIS — Z01812 Encounter for preprocedural laboratory examination: Secondary | ICD-10-CM | POA: Insufficient documentation

## 2016-05-19 DIAGNOSIS — R2240 Localized swelling, mass and lump, unspecified lower limb: Secondary | ICD-10-CM | POA: Diagnosis present

## 2016-05-19 LAB — COMPREHENSIVE METABOLIC PANEL
ALT: 26 U/L (ref 17–63)
AST: 26 U/L (ref 15–41)
Albumin: 4.3 g/dL (ref 3.5–5.0)
Alkaline Phosphatase: 43 U/L (ref 38–126)
Anion gap: 6 (ref 5–15)
BUN: 19 mg/dL (ref 6–20)
CO2: 26 mmol/L (ref 22–32)
Calcium: 9.4 mg/dL (ref 8.9–10.3)
Chloride: 104 mmol/L (ref 101–111)
Creatinine, Ser: 1.19 mg/dL (ref 0.61–1.24)
GFR calc Af Amer: >60 mL/min (ref >60)
GFR calc non Af Amer: >60 mL/min (ref >60)
Glucose, Bld: 95 mg/dL (ref 65–99)
Potassium: 4.1 mmol/L (ref 3.5–5.1)
Sodium: 136 mmol/L (ref 135–145)
Total Bilirubin: 0.7 mg/dL (ref 0.3–1.2)
Total Protein: 8 g/dL (ref 6.5–8.1)

## 2016-05-19 LAB — CBC WITH DIFFERENTIAL/PLATELET
Basophils Absolute: 0 10*3/uL (ref 0.0–0.1)
Basophils Relative: 1 %
Eosinophils Absolute: 0.1 10*3/uL (ref 0.0–0.7)
Eosinophils Relative: 2 %
HCT: 42.6 % (ref 39.0–52.0)
Hemoglobin: 14.2 g/dL (ref 13.0–17.0)
Lymphocytes Relative: 31 %
Lymphs Abs: 1.3 10*3/uL (ref 0.7–4.0)
MCH: 28.3 pg (ref 26.0–34.0)
MCHC: 33.3 g/dL (ref 30.0–36.0)
MCV: 85 fL (ref 78.0–100.0)
Monocytes Absolute: 0.4 10*3/uL (ref 0.1–1.0)
Monocytes Relative: 10 %
Neutro Abs: 2.4 10*3/uL (ref 1.7–7.7)
Neutrophils Relative %: 56 %
Platelets: 171 10*3/uL (ref 150–400)
RBC: 5.01 MIL/uL (ref 4.22–5.81)
RDW: 13.7 % (ref 11.5–15.5)
WBC: 4.2 10*3/uL (ref 4.0–10.5)

## 2016-05-19 LAB — TYPE AND SCREEN
ABO/RH(D): B POS
Antibody Screen: NEGATIVE

## 2016-05-19 LAB — ABO/RH: ABO/RH(D): B POS

## 2016-05-19 NOTE — Telephone Encounter (Signed)
Lmtcb to go over echo results and findings and recommendations . I will fax a copy of the results to Dr. Eddie Dibbles, per [place]. He is ok to proceed with his surgery but his surgeon will need to monitor his fluid status carefully to avoid volume excess. Pt to be referred to EP as well to discuss possible ICD.

## 2016-05-19 NOTE — Pre-Procedure Instructions (Signed)
RUPPERT MCLENNON  05/19/2016      [id number] - [place], [place] - [address] [place] [address] Phone: [phone] Fax: [phone]  [id number] - [place], [place] [address] Phone: [phone] Fax: [facility] [address]), [place] - [place]) [place] [address] Phone: [phone] Fax: [phone]    Your procedure is scheduled on  Oct 25  Report to [facility] [place].  Call this number if you have problems the morning of surgery:  [phone]   Remember:  Do not eat food or drink liquids after midnight.  Take these medicines the morning of surgery with A SIP OF WATER Carvedilol (Coreg)  Stop asprin on Oct 20 as directed,  Stop taking BC's, Goody's, Herbal medications, Fish Oil, Vitamins, Ibuprofen, Advil, Motrin, aleve   Do not wear jewelry, make-up or nail polish.  Do not wear lotions, powders, or perfumes, or deoderant.  Do not shave 48 hours prior to surgery.  Men may shave face and neck.  Do not bring valuables to the hospital.  [facility] is not responsible for any belongings or valuables.  Contacts, dentures or bridgework may not be worn into surgery.  Leave your suitcase in the car.  After surgery it may be brought to your room.  For patients admitted to the hospital, discharge time will be determined by your treatment team.  Patients discharged the day of surgery will not be allowed to drive home.   Special instructions:  [facility] - Preparing for Surgery  Before surgery, you can play an important role.  Because skin is not sterile, your skin needs to be as free of germs as possible.  You can reduce the number of germs on you skin by washing with CHG (chlorahexidine gluconate) soap before surgery.  CHG is an antiseptic cleaner which kills germs and bonds with  the skin to continue killing germs even after washing.  Please DO NOT use if you have an allergy to CHG or antibacterial soaps.  If your skin becomes reddened/irritated stop using the CHG and inform your nurse when you arrive at Short Stay.  Do not shave (including legs and underarms) for at least 48 hours prior to the first CHG shower.  You may shave your face.  Please follow these instructions carefully:   1.  Shower with CHG Soap the night before surgery and the  morning of Surgery.  2.  If you choose to wash your hair, wash your hair first as usual with your   normal shampoo.  3.  After you shampoo, rinse your hair and body thoroughly to remove the Shampoo.  4.  Use CHG as you would any other liquid soap.  You can apply chg directly   to the skin and wash gently with scrungie or a clean washcloth.  5.  Apply the CHG Soap to your body ONLY FROM THE NECK DOWN.        Do not use on open wounds or open sores.  Avoid contact with your eyes,       ears, mouth and genitals (private parts).  Wash genitals (private parts)  with your normal soap.  6.  Wash thoroughly, paying special attention to the area where your surgery  will be performed.  7.  Thoroughly rinse your body with warm water  from the neck down.  8.  DO NOT shower/wash with your normal soap after using and rinsing off   the CHG Soap.  9.  Pat yourself dry with a clean towel.            10.  Wear clean pajamas.            11.  Place clean sheets on your bed the night of your first shower and do not sleep with pets.  Day of Surgery  Do not apply any lotions/deoderants the morning of surgery.  Please wear clean clothes to the hospital/surgery center.     Please read over the following fact sheets that you were given. Pain Booklet, Coughing and Deep Breathing, MRSA Information and Surgical Site Infection Prevention

## 2016-05-19 NOTE — Progress Notes (Signed)
Denies having a PCP Saw Dr Irish Lack in 2017 Echo and card cath noted in epic From 2017 Denies any chest pain.

## 2016-05-22 NOTE — Progress Notes (Signed)
Anesthesia Chart Review:  Pt is a 45 year old male scheduled for excision of L thigh mass on 05/24/2016 with Hall Busing, MD.   - Cardiologist is Larae Grooms, MD, last office visit 05/16/16 with Richardson Dopp, PA.  Pt has cardiac clearance for surgery from Mr. Kathlen Mody in comment on 05/18/16 echo report.  "He is ok to proceed with his surgery but his surgeon will need to monitor his fluid status carefully to avoid volume excess."  Pt was also referred to EP cardiology to determine need for ICD, but this appt has not yet been scheduled.   - Neurologist is Antony Contras, MD, who cleared pt from neurology perspective at last office visit 05/10/16.   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 Never smoker. BMI 30.5  Medications: ASA, lipitor, carvedilol, Lasix, losartan, potassium.  Preoperative labs reviewed.    CXR 05/16/16:  1. Stable cardiomegaly. No acute pulmonary disease. Reference is made to prior chest CT report 11/15/2015 for discussion of 3 mm left upper lobe nodule. No pulmonary nodules noted on this exam. 2. Air is noted small and large bowel. Mild adynamic ileus cannot be excluded .  EKG 05/16/16: NSR. Rightward axis. LBBB. Inferior infarct, age undetermined.   Echo 05/18/16:  - Left ventricle: Prominent LV apical trabeculations. The cavity size was severely dilated. Wall thickness was normal. Systolic function was severely reduced. The estimated ejection fraction was in the range of 20% to 25%. Diffuse hypokinesis. Doppler parameters are consistent with both elevated ventricular end-diastolic filling pressure and elevated left atrial filling pressure. - Left atrium: The atrium was mildly dilated. - Atrial septum: No defect or patent foramen ovale was identified.  Cardiac cath 11/30/15:   There is severe left ventricular systolic dysfunction.  No significant coronary artery disease.  Elevated  LVEDP.  If no changes, I anticipate pt can proceed with surgery as scheduled.   Willeen Cass, FNP-BC [facility] Short Stay Surgical Center/Anesthesiology Phone: [phone] 05/22/2016 2:37 PM

## 2016-05-22 NOTE — Telephone Encounter (Signed)
Pt notified of echo results and findings by phone with verbal understanding. Pt is agreeable to see EP and discuss possible ICD, EF 20-25%. I will,fax results to Dr. Eddie Dibbles as well pt ok to proceed with surgery.

## 2016-05-24 ENCOUNTER — Observation Stay (HOSPITAL_COMMUNITY)
Admission: RE | Admit: 2016-05-24 | Discharge: 2016-05-25 | Disposition: A | Discharge status: Home / Self Care | Payer: Managed Care, Other (non HMO) | Source: Ambulatory Visit | Attending: General Surgery | Admitting: General Surgery

## 2016-05-24 ENCOUNTER — Encounter (HOSPITAL_COMMUNITY): Payer: Self-pay | Admitting: *Deleted

## 2016-05-24 ENCOUNTER — Ambulatory Visit (HOSPITAL_COMMUNITY): Payer: Managed Care, Other (non HMO) | Admitting: Emergency Medicine

## 2016-05-24 ENCOUNTER — Ambulatory Visit (HOSPITAL_COMMUNITY): Payer: Managed Care, Other (non HMO) | Admitting: Certified Registered Nurse Anesthetist

## 2016-05-24 ENCOUNTER — Encounter (HOSPITAL_COMMUNITY)
Admission: RE | Disposition: A | Discharge status: Home / Self Care | Payer: Self-pay | Source: Ambulatory Visit | Attending: General Surgery

## 2016-05-24 DIAGNOSIS — C4922 Malignant neoplasm of connective and soft tissue of left lower limb, including hip: Secondary | ICD-10-CM | POA: Diagnosis present

## 2016-05-24 DIAGNOSIS — Z8673 Personal history of transient ischemic attack (TIA), and cerebral infarction without residual deficits: Secondary | ICD-10-CM | POA: Diagnosis not present

## 2016-05-24 DIAGNOSIS — I509 Heart failure, unspecified: Secondary | ICD-10-CM | POA: Diagnosis not present

## 2016-05-24 DIAGNOSIS — R2242 Localized swelling, mass and lump, left lower limb: Secondary | ICD-10-CM | POA: Diagnosis present

## 2016-05-24 DIAGNOSIS — D1724 Benign lipomatous neoplasm of skin and subcutaneous tissue of left leg: Secondary | ICD-10-CM | POA: Diagnosis not present

## 2016-05-24 HISTORY — PX: OTHER SURGICAL HISTORY: SHX169

## 2016-05-24 HISTORY — PX: MASS EXCISION: SHX2000

## 2016-05-24 LAB — CREATININE, SERUM
Creatinine, Ser: 1.13 mg/dL (ref 0.61–1.24)
GFR calc Af Amer: >60 mL/min (ref >60)
GFR calc non Af Amer: >60 mL/min (ref >60)

## 2016-05-24 LAB — CBC
HCT: 41.9 % (ref 39.0–52.0)
Hemoglobin: 13.9 g/dL (ref 13.0–17.0)
MCH: 28.1 pg (ref 26.0–34.0)
MCHC: 33.2 g/dL (ref 30.0–36.0)
MCV: 84.8 fL (ref 78.0–100.0)
Platelets: 167 10*3/uL (ref 150–400)
RBC: 4.94 MIL/uL (ref 4.22–5.81)
RDW: 13.7 % (ref 11.5–15.5)
WBC: 4.1 10*3/uL (ref 4.0–10.5)

## 2016-05-24 SURGERY — EXCISION MASS
Anesthesia: General | Laterality: Left

## 2016-05-24 MED ORDER — POTASSIUM CHLORIDE IN NACL 20-0.45 MEQ/L-% IV SOLN
INTRAVENOUS | Status: DC
  Filled 2016-05-24: qty 1000

## 2016-05-24 MED ORDER — CARVEDILOL 6.25 MG PO TABS
9.3750 mg | ORAL_TABLET | Freq: Two times a day (BID) | ORAL | Status: DC
  Administered 2016-05-24 – 2016-05-25 (×2): 9.375 mg via ORAL
  Filled 2016-05-24 (×2): qty 1

## 2016-05-24 MED ORDER — PROMETHAZINE HCL 25 MG/ML IJ SOLN: 6.2500 mg | INTRAMUSCULAR | Status: DC | PRN

## 2016-05-24 MED ORDER — FENTANYL CITRATE (PF) 100 MCG/2ML IJ SOLN
INTRAMUSCULAR | Status: AC
  Filled 2016-05-24: qty 4

## 2016-05-24 MED ORDER — LACTATED RINGERS IV SOLN
INTRAVENOUS | Status: DC
  Administered 2016-05-24 (×2): via INTRAVENOUS

## 2016-05-24 MED ORDER — MIDAZOLAM HCL 2 MG/2ML IJ SOLN
INTRAMUSCULAR | Status: AC
  Filled 2016-05-24: qty 2

## 2016-05-24 MED ORDER — MIDAZOLAM HCL 5 MG/5ML IJ SOLN
INTRAMUSCULAR | Status: DC | PRN
  Administered 2016-05-24: 2 mg via INTRAVENOUS

## 2016-05-24 MED ORDER — BUPIVACAINE-EPINEPHRINE (PF) 0.5% -1:200000 IJ SOLN
INTRAMUSCULAR | Status: AC
  Filled 2016-05-24: qty 30

## 2016-05-24 MED ORDER — PROPOFOL 10 MG/ML IV BOLUS
INTRAVENOUS | Status: DC | PRN
  Administered 2016-05-24: 160 mg via INTRAVENOUS

## 2016-05-24 MED ORDER — LOSARTAN POTASSIUM 50 MG PO TABS
25.0000 mg | ORAL_TABLET | Freq: Every day | ORAL | Status: DC
  Administered 2016-05-25: 25 mg via ORAL
  Filled 2016-05-24: qty 1

## 2016-05-24 MED ORDER — ENOXAPARIN SODIUM 30 MG/0.3ML ~~LOC~~ SOLN
30.0000 mg | SUBCUTANEOUS | Status: DC
  Administered 2016-05-24: 30 mg via SUBCUTANEOUS
  Filled 2016-05-24: qty 0.3

## 2016-05-24 MED ORDER — CEFAZOLIN SODIUM-DEXTROSE 2-4 GM/100ML-% IV SOLN
2.0000 g | INTRAVENOUS | Status: AC
  Administered 2016-05-24: 2 g via INTRAVENOUS
  Filled 2016-05-24: qty 100

## 2016-05-24 MED ORDER — ONDANSETRON HCL 4 MG/2ML IJ SOLN
INTRAMUSCULAR | Status: DC | PRN
Start: 2016-05-24 — End: 2016-05-24
  Administered 2016-05-24: 4 mg via INTRAVENOUS

## 2016-05-24 MED ORDER — BUPIVACAINE-EPINEPHRINE 0.5% -1:200000 IJ SOLN
INTRAMUSCULAR | Status: DC | PRN
Start: 2016-05-24 — End: 2016-05-24
  Administered 2016-05-24: 9 mL

## 2016-05-24 MED ORDER — HYDROMORPHONE HCL 1 MG/ML IJ SOLN: 0.2500 mg | INTRAMUSCULAR | Status: DC | PRN

## 2016-05-24 MED ORDER — PHENYLEPHRINE 40 MCG/ML (10ML) SYRINGE FOR IV PUSH (FOR BLOOD PRESSURE SUPPORT)
PREFILLED_SYRINGE | INTRAVENOUS | Status: DC | PRN
  Administered 2016-05-24 (×2): 40 ug via INTRAVENOUS
  Administered 2016-05-24: 80 ug via INTRAVENOUS
  Administered 2016-05-24 (×2): 40 ug via INTRAVENOUS

## 2016-05-24 MED ORDER — LIDOCAINE 2% (20 MG/ML) 5 ML SYRINGE
INTRAMUSCULAR | Status: AC
  Filled 2016-05-24: qty 5

## 2016-05-24 MED ORDER — EPHEDRINE SULFATE-NACL 50-0.9 MG/10ML-% IV SOSY
PREFILLED_SYRINGE | INTRAVENOUS | Status: DC | PRN
  Administered 2016-05-24 (×2): 5 mg via INTRAVENOUS

## 2016-05-24 MED ORDER — ENOXAPARIN (LOVENOX) PATIENT EDUCATION KIT
PACK | Freq: Once | Status: AC
  Administered 2016-05-24: 16:00:00
  Filled 2016-05-24: qty 1

## 2016-05-24 MED ORDER — ONDANSETRON HCL 4 MG/2ML IJ SOLN
INTRAMUSCULAR | Status: AC
  Filled 2016-05-24: qty 2

## 2016-05-24 MED ORDER — 0.9 % SODIUM CHLORIDE (POUR BTL) OPTIME
TOPICAL | Status: DC | PRN
Start: 2016-05-24 — End: 2016-05-24
  Administered 2016-05-24: 1000 mL

## 2016-05-24 MED ORDER — ASPIRIN EC 325 MG PO TBEC
325.0000 mg | DELAYED_RELEASE_TABLET | Freq: Every day | ORAL | Status: DC
  Administered 2016-05-25: 325 mg via ORAL
  Filled 2016-05-24: qty 1

## 2016-05-24 MED ORDER — OXYCODONE-ACETAMINOPHEN 5-325 MG PO TABS: 1.0000 | ORAL_TABLET | ORAL | Status: DC | PRN

## 2016-05-24 MED ORDER — ATORVASTATIN CALCIUM 40 MG PO TABS
40.0000 mg | ORAL_TABLET | Freq: Every day | ORAL | Status: DC
  Administered 2016-05-24: 40 mg via ORAL
  Filled 2016-05-24: qty 1

## 2016-05-24 MED ORDER — FUROSEMIDE 40 MG PO TABS
40.0000 mg | ORAL_TABLET | Freq: Every day | ORAL | Status: DC
  Administered 2016-05-24 – 2016-05-25 (×2): 40 mg via ORAL
  Filled 2016-05-24 (×2): qty 1

## 2016-05-24 MED ORDER — MORPHINE SULFATE (PF) 2 MG/ML IV SOLN: 2.0000 mg | INTRAVENOUS | Status: DC | PRN

## 2016-05-24 MED ORDER — LIDOCAINE 2% (20 MG/ML) 5 ML SYRINGE
INTRAMUSCULAR | Status: DC | PRN
  Administered 2016-05-24: 100 mg via INTRAVENOUS

## 2016-05-24 MED ORDER — LIDOCAINE HCL (PF) 1 % IJ SOLN
INTRAMUSCULAR | Status: AC
  Filled 2016-05-24: qty 30

## 2016-05-24 MED ORDER — EPHEDRINE 5 MG/ML INJ
INTRAVENOUS | Status: AC
  Filled 2016-05-24: qty 10

## 2016-05-24 MED ORDER — FENTANYL CITRATE (PF) 100 MCG/2ML IJ SOLN
INTRAMUSCULAR | Status: DC | PRN
  Administered 2016-05-24: 25 ug via INTRAVENOUS
  Administered 2016-05-24 (×2): 50 ug via INTRAVENOUS

## 2016-05-24 MED ORDER — PHENYLEPHRINE 40 MCG/ML (10ML) SYRINGE FOR IV PUSH (FOR BLOOD PRESSURE SUPPORT)
PREFILLED_SYRINGE | INTRAVENOUS | Status: AC
  Filled 2016-05-24: qty 10

## 2016-05-24 SURGICAL SUPPLY — 48 items
APPLIER CLIP 9.375 MED OPEN (MISCELLANEOUS) ×3
BENZOIN TINCTURE PRP APPL 2/3 (GAUZE/BANDAGES/DRESSINGS) ×3 IMPLANT
BNDG COHESIVE 6X5 TAN STRL LF (GAUZE/BANDAGES/DRESSINGS) ×3 IMPLANT
CANISTER SUCTION 2500CC (MISCELLANEOUS) ×3 IMPLANT
CHLORAPREP W/TINT 26ML (MISCELLANEOUS) ×3 IMPLANT
CLIP APPLIE 9.375 MED OPEN (MISCELLANEOUS) ×1 IMPLANT
COVER SURGICAL LIGHT HANDLE (MISCELLANEOUS) ×3 IMPLANT
DERMABOND ADVANCED (GAUZE/BANDAGES/DRESSINGS) ×2
DERMABOND ADVANCED .7 DNX12 (GAUZE/BANDAGES/DRESSINGS) ×1 IMPLANT
DRAIN CHANNEL 10M FLAT 3/4 FLT (DRAIN) ×3 IMPLANT
DRAPE LAPAROTOMY 100X72 PEDS (DRAPES) IMPLANT
DRAPE UTILITY XL STRL (DRAPES) ×6 IMPLANT
ELECT CAUTERY BLADE 6.4 (BLADE) ×3 IMPLANT
ELECT COATED BLADE 2.86 ST (ELECTRODE) ×3 IMPLANT
ELECT REM PT RETURN 9FT ADLT (ELECTROSURGICAL) ×3
ELECTRODE REM PT RTRN 9FT ADLT (ELECTROSURGICAL) ×1 IMPLANT
GAUZE SPONGE 2X2 8PLY STRL LF (GAUZE/BANDAGES/DRESSINGS) ×1 IMPLANT
GAUZE SPONGE 4X4 16PLY XRAY LF (GAUZE/BANDAGES/DRESSINGS) ×3 IMPLANT
GLOVE BIO SURGEON STRL SZ 6 (GLOVE) ×3 IMPLANT
GLOVE BIOGEL PI IND STRL 6.5 (GLOVE) ×1 IMPLANT
GLOVE BIOGEL PI INDICATOR 6.5 (GLOVE) ×2
GOWN STRL REUS W/ TWL LRG LVL3 (GOWN DISPOSABLE) ×2 IMPLANT
GOWN STRL REUS W/TWL 2XL LVL3 (GOWN DISPOSABLE) ×3 IMPLANT
GOWN STRL REUS W/TWL LRG LVL3 (GOWN DISPOSABLE) ×4
KIT BASIN OR (CUSTOM PROCEDURE TRAY) ×3 IMPLANT
KIT ROOM TURNOVER OR (KITS) ×3 IMPLANT
NEEDLE HYPO 25GX1X1/2 BEV (NEEDLE) ×3 IMPLANT
NS IRRIG 1000ML POUR BTL (IV SOLUTION) ×3 IMPLANT
PACK SURGICAL SETUP 50X90 (CUSTOM PROCEDURE TRAY) ×3 IMPLANT
PAD ARMBOARD 7.5X6 YLW CONV (MISCELLANEOUS) ×6 IMPLANT
PENCIL BUTTON HOLSTER BLD 10FT (ELECTRODE) ×3 IMPLANT
SHEARS HARMONIC 9CM CVD (BLADE) ×3 IMPLANT
SPECIMEN JAR SMALL (MISCELLANEOUS) ×3 IMPLANT
SPONGE GAUZE 2X2 STER 10/PKG (GAUZE/BANDAGES/DRESSINGS) ×2
SPONGE LAP 18X18 X RAY DECT (DISPOSABLE) IMPLANT
SPONGE LAP 4X18 X RAY DECT (DISPOSABLE) IMPLANT
SUT MON AB 4-0 PC3 18 (SUTURE) ×3 IMPLANT
SUT SILK 2 0 FS (SUTURE) IMPLANT
SUT VIC AB 3-0 SH 18 (SUTURE) ×3 IMPLANT
SUT VIC AB 3-0 SH 27 (SUTURE) ×2
SUT VIC AB 3-0 SH 27X BRD (SUTURE) ×1 IMPLANT
SYR BULB 3OZ (MISCELLANEOUS) ×3 IMPLANT
SYR CONTROL 10ML LL (SYRINGE) ×3 IMPLANT
TAPE CLOTH SURG 4X10 WHT LF (GAUZE/BANDAGES/DRESSINGS) ×3 IMPLANT
TOWEL OR 17X24 6PK STRL BLUE (TOWEL DISPOSABLE) ×3 IMPLANT
TOWEL OR 17X26 10 PK STRL BLUE (TOWEL DISPOSABLE) ×3 IMPLANT
TUBE CONNECTING 12'X1/4 (SUCTIONS) ×1
TUBE CONNECTING 12X1/4 (SUCTIONS) ×2 IMPLANT

## 2016-05-24 NOTE — Anesthesia Procedure Notes (Signed)
Procedure Name: LMA Insertion Date/Time: 05/24/2016 9:53 AM Performed by: Everlean Cherry A Pre-anesthesia Checklist: Patient identified, Emergency Drugs available, Suction available and Patient being monitored Patient Re-evaluated:Patient Re-evaluated prior to inductionOxygen Delivery Method: Circle system utilized Preoxygenation: Pre-oxygenation with 100% oxygen Intubation Type: IV induction Ventilation: Mask ventilation without difficulty LMA: LMA inserted LMA Size: 5.0 Number of attempts: 1 Placement Confirmation: positive ETCO2 and breath sounds checked- equal and bilateral Tube secured with: Tape Dental Injury: Teeth and Oropharynx as per pre-operative assessment

## 2016-05-24 NOTE — Op Note (Signed)
05/24/2016  11:08 AM  PATIENT:  Austin Holmes  45 y.o. male  PRE-OPERATIVE DIAGNOSIS:  Left Thigh Mass  POST-OPERATIVE DIAGNOSIS:  Left Thigh Mass  PROCEDURE:  Radical resection left proximal medial thigh mass (greater than 5 cm, deep to the fascia)  SURGEON:   T Tonye Becket, MD - Primary  ANESTHESIA:   general with local bupivacaine  EBL:  20 ml  IVF: 500 ml  DRAINS: JP x 1  LOCAL MEDICATIONS USED:  Bupivicaine  SPECIMEN: Left Proximal, Medial Soft tissue mass with portion of adductor muscle  DISPOSITION OF SPECIMEN:  PATHOLOGY  COUNTS: Correct x 2   DICTATION: Dragon Dictation  INDICATION FOR PROCEDURE: The patient is a 45 year old gentleman who presented originally for stroke and underlying cardiac issues when he was found to have a soft tissue mass in the proximal medial left thigh prompting further evaluation once his neurologic symptoms resolved. This demonstrated no evidence of distant metastatic disease other than a nonspecific pulmonary nodule that did not appear suspicious for metastasis. He did undergo a biopsy with findings correlated to the imaging studies consistent with an atypical lipomatous tumor/low-grade liposarcoma. He is now being brought for resection of the left proximal medial thigh soft tissue mass.  PROCEDURE IN DETAIL: The patient was identified taken to the operating room and placed in supine position. He was prepped and draped status sterile fashion. Timeout was performed and the index lesion which a been marked verified with the patient preoperatively was noted in the proximal medial left thigh.  The area was infiltrated using local anesthetic and then incised longitudinally along the long axis of the extremity. Dissection was carried out to incise the fascia and then posteriorly the flap was developed down to the level of the underlying adductor muscle. The anterior flap was then dissected under the fascia down to the level of the  muscle and the neurovascular bundle was identified and preserved along its length. A portion of the adductor muscle was invested into the tumor in the deep aspect posteriorly and this was incised and taken off en bloc with the primary tumor. Dissection was carried out circumferentially carefully separating this from the underlying neurovascular bundle along its length. The tumor was able to be elevated out of the wound separating its attachments and then delivered from the wound. The specimen was oriented with marking sutures and passed off the field for permanent. The wound was verified to be hemostatic.  It was irrigated with copious amounts of warm irrigation. Hemostasis was again ensured and marking clips were placed circumferentially in case postoperative radiation would be planned. The drain was brought out through separate stab incision in line with the primary incision and sutured in place using a 3-0 nylon suture. The wound was reapproximated by reapproximating the underlying fascia with interrupted 0 Vicryl suture followed by a layer of interrupted 3-0 Vicryl suture followed by a second layer of interrupted 3-0 Vicryl suture followed by a running 3-0 Monocryl subcutaneous particular closure. The wound was sealed using Dermabond and dressings were applied. At the end of the case sponge and needle counts were reported as correct 2. I was present scrubbed the entire procedure.  PLAN OF CARE: Admit for overnight observation  PATIENT DISPOSITION:  PACU - hemodynamically stable.

## 2016-05-24 NOTE — Anesthesia Preprocedure Evaluation (Addendum)
Anesthesia Evaluation  Patient identified by MRN, date of birth, ID band  Reviewed: Allergy & Precautions, NPO status , Patient's Chart, lab work & pertinent test results  Airway Mallampati: I  TM Distance: >3 FB Neck ROM: Full    Dental  (+) Teeth Intact   Pulmonary    breath sounds clear to auscultation       Cardiovascular +CHF   Rhythm:Regular Rate:Normal  Cardiomyopathy EF 20%   Neuro/Psych TIACVA    GI/Hepatic negative GI ROS, Neg liver ROS,   Endo/Other  negative endocrine ROS  Renal/GU negative Renal ROS     Musculoskeletal   Abdominal   Peds  Hematology negative hematology ROS (+)   Anesthesia Other Findings   Reproductive/Obstetrics                            Anesthesia Physical Anesthesia Plan  ASA: IV  Anesthesia Plan: General   Post-op Pain Management:    Induction: Intravenous  Airway Management Planned: LMA  Additional Equipment:   Intra-op Plan:   Post-operative Plan: Extubation in OR  Informed Consent: I have reviewed the patients History and Physical, chart, labs and discussed the procedure including the risks, benefits and alternatives for the proposed anesthesia with the patient or authorized representative who has indicated his/her understanding and acceptance.     Plan Discussed with: CRNA  Anesthesia Plan Comments: (Due to very low EF patient at increased risk anesth and surgery)       Anesthesia Quick Evaluation

## 2016-05-24 NOTE — Interval H&P Note (Signed)
History and Physical Interval Note:  05/24/2016 9:25 AM  Austin Holmes  has presented today for surgery, with the diagnosis of left thigh mass  The various methods of treatment have been discussed with the patient and family. After consideration of risks, benefits and other options for treatment, the patient has consented to  Procedure(s): EXCISION of left thigh MASS (Left) as a surgical intervention .  The patient's history has been reviewed, patient examined, no change in status, stable for surgery.  I have reviewed the patient's chart and labs.  Questions were answered to the patient's satisfaction.     Frederich Cha

## 2016-05-24 NOTE — Anesthesia Postprocedure Evaluation (Signed)
Anesthesia Post Note  Patient: Austin Holmes  Procedure(s) Performed: Procedure(s) (LRB): EXCISION of left thigh MASS (Left)  Patient location during evaluation: PACU Anesthesia Type: General Level of consciousness: awake and alert Pain management: pain level controlled Vital Signs Assessment: post-procedure vital signs reviewed and stable Respiratory status: spontaneous breathing, nonlabored ventilation, respiratory function stable and patient connected to nasal cannula oxygen Cardiovascular status: blood pressure returned to baseline and stable Postop Assessment: no signs of nausea or vomiting Anesthetic complications: no    Last Vitals:  Vitals:   05/24/16 1228 05/24/16 1244  BP: (!) 183/115 124/85  Pulse: 65 71  Resp: 17 15  Temp:  36.4 C    Last Pain:  Vitals:   05/24/16 1244  TempSrc: Oral  PainSc:                  Ireoluwa Grant,JAMES TERRILL

## 2016-05-24 NOTE — H&P (View-Only) (Signed)
History of presenting illness Austin Holmes is a 45 year old gentleman who is well-known to our service when he first presented with a asymptomatic left proximal medial thigh soft tissue mass that was biopsied with the findings consistent with atypical lipomatous tumor and imaging that was consistent with that histologic appearance. He was lost to follow-up but now re-presents with pain in his right leg despite no significant changes with a mass in his left leg. He is now comfortable with moving forward with surgery. He has now completed clearance from the cardiology service (pending an echo cardiogram) as well as clearance from neurology with the recommendation to hold his anti-platelet therapy (aspirin) 3-5 days prior to surgery.  Physical examination: HEENT: Mucous membranes moist neck is supple no JVD, no supraclavicular or cervical adenopathy Lungs: Clear to auscultation and equal breath sounds CVS: Regular rate rhythm 2+ distal pulses  Abdomen: Soft nontender nondistended Extremities: Well-circumscribed minimally mobile, rubbery soft tissue mass in the proximal medial left thigh. No overlying skin changes, nontender. Approximately 10 x 8 cm.  DATA: MRI scan left femur with and without contrast dated 05/16/2016. I reviewed the films as well as the report with the patient and his mother. The impression is a stable 7 x 7.4 x 9.3 cm well-circumscribed mass with heterogeneous nodular enhancement located between the upper left such aureus and abductor longus muscle adjacent to the femoral neurovascular bundle. 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  Neurology clearance dated 05/10/2016. The plan included the statement that the patient is neurologically cleared to undergo surgical removal of the lipoma and will have to hold aspirin for 3-5 days prior to surgery with a small but acceptable risk of periprocedural stroke/TIA if acceptable with the patient.  Cardiac clearance  dated 05/16/2016 In the assessment and plan they report the patient does not have any unstable cardiac conditions. Upon evaluation today he can achieve 4 MG E Tis or greater without anginal symptoms. He may proceed with his noncardiac surgery and should be at acceptable risk. 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 I would like to reevaluate his LVEF prior to surgery.  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 He is now been cleared by neurology to move forward with surgery. He has been cleared by cardiology pending an echocardiogram to move forward with surgery.  Plan: At a long discussion with the patient and his mother. Reviewed the risk and benefits at length including bleeding, infection, seroma development, need for further surgery if a seroma develops. He also understands he may need a drain replacement if the seroma develops. We also reviewed at length the close proximity of the tumor to the neurovascular bundle. They understand that this may be associated with wrist to the neurovascular structures including the possibility of clot in the vein. Further, they understand that due to the low-grade nature of this lesion we will not be doing extensive muscle resection to achieve wide clear margins. They understand that if we encounter positive margins and the final resected specimen we will follow him expectantly. They understand and agree with the plan as outlined above. We have ordered a plain film radiograph the chest rule out metastatic disease. If that is negative and he is thereby cardiology following his echocardiogram we will move forward with surgery next week.         ;

## 2016-05-24 NOTE — Brief Op Note (Signed)
05/24/2016  11:08 AM  PATIENT:  Austin Holmes  45 y.o. male  PRE-OPERATIVE DIAGNOSIS:  Left Thigh Mass  POST-OPERATIVE DIAGNOSIS:  Left Thigh Mass  PROCEDURE:  Radical resection left proximal medial thigh mass  SURGEON:  Surgeon(s) and Role:    T Tonye Becket, MD - Primary  ANESTHESIA:   general with local bupivacaine  EBL:  20 ml  IVF: 500 ml  DRAINS: JP x 1  LOCAL MEDICATIONS USED:  Bupivicaine  SPECIMEN: Left Proximal, Medial Soft tissue mass with portion of adductor muscle  DISPOSITION OF SPECIMEN:  PATHOLOGY  COUNTS: Correct x 2   DICTATION: .Viviann Spare Dictation  PLAN OF CARE: Admit for overnight observation  PATIENT DISPOSITION:  PACU - hemodynamically stable.

## 2016-05-24 NOTE — Transfer of Care (Signed)
Immediate Anesthesia Transfer of Care Note  Patient: Austin Holmes  Procedure(s) Performed: Procedure(s): EXCISION of left thigh MASS (Left)  Patient Location: PACU  Anesthesia Type:General  Level of Consciousness: awake, alert , oriented and patient cooperative  Airway & Oxygen Therapy: Patient Spontanous Breathing and Patient connected to nasal cannula oxygen  Post-op Assessment: Report given to RN and Post -op Vital signs reviewed and stable  Post vital signs: Reviewed and stable  Last Vitals:  Vitals:   05/24/16 0744  BP: 128/82  Pulse: 75  Resp: 20  Temp: 36.7 C    Last Pain:  Vitals:   05/24/16 0744  TempSrc: Oral         Complications: No apparent anesthesia complications

## 2016-05-25 ENCOUNTER — Other Ambulatory Visit: Payer: Self-pay | Admitting: Gynecologic Oncology

## 2016-05-25 ENCOUNTER — Encounter (HOSPITAL_COMMUNITY): Payer: Self-pay | Admitting: General Surgery

## 2016-05-25 DIAGNOSIS — D1724 Benign lipomatous neoplasm of skin and subcutaneous tissue of left leg: Secondary | ICD-10-CM | POA: Diagnosis not present

## 2016-05-25 DIAGNOSIS — G8918 Other acute postprocedural pain: Secondary | ICD-10-CM

## 2016-05-25 LAB — BASIC METABOLIC PANEL
Anion gap: 6 (ref 5–15)
BUN: 15 mg/dL (ref 6–20)
CO2: 31 mmol/L (ref 22–32)
Calcium: 9.4 mg/dL (ref 8.9–10.3)
Chloride: 100 mmol/L — ABNORMAL LOW (ref 101–111)
Creatinine, Ser: 1.42 mg/dL — ABNORMAL HIGH (ref 0.61–1.24)
GFR calc Af Amer: >60 mL/min (ref >60)
GFR calc non Af Amer: 58 mL/min — ABNORMAL LOW (ref >60)
Glucose, Bld: 94 mg/dL (ref 65–99)
Potassium: 3.9 mmol/L (ref 3.5–5.1)
Sodium: 137 mmol/L (ref 135–145)

## 2016-05-25 LAB — CBC
HCT: 40 % (ref 39.0–52.0)
Hemoglobin: 13.2 g/dL (ref 13.0–17.0)
MCH: 28.1 pg (ref 26.0–34.0)
MCHC: 33 g/dL (ref 30.0–36.0)
MCV: 85.3 fL (ref 78.0–100.0)
Platelets: 165 10*3/uL (ref 150–400)
RBC: 4.69 MIL/uL (ref 4.22–5.81)
RDW: 13.7 % (ref 11.5–15.5)
WBC: 6.2 10*3/uL (ref 4.0–10.5)

## 2016-05-25 MED ORDER — OXYCODONE-ACETAMINOPHEN 5-325 MG PO TABS
1.0000 | ORAL_TABLET | ORAL | Status: DC | PRN
Start: 2016-05-25 — End: 2016-05-25

## 2016-05-25 MED ORDER — ENOXAPARIN SODIUM 60 MG/0.6ML ~~LOC~~ SOLN: 50.0000 mg | SUBCUTANEOUS | Status: DC

## 2016-05-25 MED ORDER — ENOXAPARIN SODIUM 60 MG/0.6ML ~~LOC~~ SOLN: 50.0000 mg | SUBCUTANEOUS | 0 refills | Status: DC

## 2016-05-25 MED ORDER — OXYCODONE-ACETAMINOPHEN 5-325 MG PO TABS: 1.0000 | ORAL_TABLET | ORAL | 0 refills | Status: DC | PRN

## 2016-05-25 MED ORDER — ENOXAPARIN SODIUM 60 MG/0.6ML ~~LOC~~ SOLN
50.0000 mg | SUBCUTANEOUS | Status: DC
  Administered 2016-05-25: 50 mg via SUBCUTANEOUS
  Filled 2016-05-25: qty 0.6

## 2016-05-25 NOTE — Addendum Note (Signed)
Addended by: Joylene John D on: 05/25/2016 12:39 PM   Modules accepted: Orders

## 2016-05-25 NOTE — Discharge Summary (Signed)
Physician Discharge Summary  Patient ID: CHOL GRAFFIUS MRN: [id number] DOB/AGE: [birth year] 45 y.o.  Admit date: 05/24/2016 Discharge date: 05/25/2016  Admission Diagnoses: Left proximal medial thigh atypical lipomatous tumor/low-grade liposarcoma  Discharge Diagnoses:  Active Problems:   Liposarcoma of left thigh [facility])   Discharged Condition: good  Hospital Course: The patient was admitted and underwent an uncomplicated resection of the left proximal medial thigh soft tissue mass. He has had excellent pain control postoperatively and is now ready for discharge home.   Treatments: Radical resection left proximal medial thigh soft tissue atypical lipomatous tumor/low grade liposarcoma  Discharge Exam: Blood pressure 103/69, pulse 71, temperature 98.1 F (36.7 C), temperature source Oral, resp. rate 18, height 6\' 1"  (1.854 m), weight 104.3 kg (230 lb), SpO2 98 %. Gen.: Awake alert Lungs: Clear to auscultation and equal breath sounds, no crackles Cardiovascular system: Regular rate and rhythm 2+ distal pulses Extremity: Incision clean, dry, without erythema warmth or discharge. Drain was serosanguineous output. Neurovascularly intact.   Disposition: Home in good condition. The patient has been instructed on Lovenox injections and drain care.   Follow-up Information    Frederich Cha, MD .   Specialty:  General Surgery Why:  Please call [phone] on a daily basis with the total output from your drain.  We will make an appointment in the office for drain removal once output is less than 30 cc in a 24 hour period or 2 weeks have pasted since surgery. Contact information: [place] [address] [phone]           Signed: Frederich Cha 05/25/2016, 9:45 AM

## 2016-05-25 NOTE — Discharge Instructions (Addendum)
No driving while on pain medication  Keep leg elevated when at rest.  Call daily to [phone] with drain output totals  Give yourself lovenox injections once daily but you will need to push out the extra medication (you want 0.5 ML total in the syringe then pull back on the plunger to add small air bubble). You will do these injections for a total of 2 weeks  Call for any questions, concerns, or new symptoms.  After Hours with issues or concerns, call Dr. Suzzanne Cloud cell phone at [phone].   How and Where to Give Subcutaneous Enoxaparin Injections  Enoxaparin is an injectable medicine. It is used to help prevent blood clots from developing in your veins. Health care providers often use anticoagulants like enoxaparin to prevent clots following surgery. Enoxaparin is also used in combination with other medicines to treat blood clots and heart attacks. If blood clots are left untreated, they can be life threatening.  Enoxaparin comes in single-use syringes. You inject enoxaparin through a syringe into your belly (abdomen). You should change the injection site each time you give yourself a shot. Continue the enoxaparin injections as directed by your health care provider. Your health care provider will use blood clotting test results to decide when you can safely stop using enoxaparin injections. If your health care provider prescribes any additional medicines, use the medicines exactly as directed. HOW DO I INJECT ENOXAPARIN?  1. Wash your hands with soap and water. 2. Clean the selected injection site as directed by your health care provider. 3. Remove the needle cap by pulling it straight off the syringe. 4. Hold the syringe like a pencil using your writing hand. 5. Use your other hand to pinch and hold an inch of the cleansed skin. 6. Insert the entire needle straight down into the fold of skin. 7. Push the plunger with your thumb until the syringe is empty. 8. Pull the needle  straight out of your skin. 9. Enoxaparin injection prefilled syringes and graduated prefilled syringes are available with a system that shields the needle after injection. After you have completed your injection and removed the needle from your skin, firmly push down on the plunger. The protective sleeve will automatically cover the needle and you will hear a click. The click means the needle is safely covered. 10. Place the syringe in the nearest needle box, also called a sharps container. If you do not have a sharps container, you can use a hard-sided plastic container with a secure lid, such as an empty laundry detergent bottle. WHAT ELSE DO I NEED TO KNOW?  Do not use enoxaparin if:  You have allergies to heparin or pork products.  You have been diagnosed with a condition called thrombocytopenia.  Do not use the syringe or needle more than one time.  Use medicines only as directed by your health care provider.  Changes in medicines, supplements, diet, and illness can affect your anticoagulation therapy. Be sure to inform your health care provider of any of these changes.  It is important that you tell all of your health care providers and your dentist that you are taking an anticoagulant, especially if you are injured or plan to have any type of procedure.  While on anticoagulants, you will need to have blood tests done routinely as directed by your health care provider.  While using this medicine, avoid physical activities or sports that could result in a fall or cause injury.  Follow up with your laboratory test and  health care provider appointments as directed. It is very important to keep your appointments. Not keeping appointments could result in a chronic or permanent injury, pain, or disability.  Before giving your medicine, you should make sure the injection is a clear and colorless or pale yellow solution. If your medicine becomes discolored or if there are particles in the  syringe, do not use it and notify your health care provider.  Keep your medicine safely stored at room temperature. SEEK MEDICAL CARE IF:  You develop any rashes on your skin.  You have large areas of bruising on your skin.  You have any worsening of the condition for which you take Enoxaparin.  You develop a fever. SEEK IMMEDIATE MEDICAL CARE IF:  You develop bleeding problems such as:  Bleeding from the gums or nose that does not stop quickly.  Vomiting blood or coughing up blood.  Blood in your urine.  Blood in your stool, or stool that has a dark, tarry, or coffee grounds appearance.  A cut that does not stop bleeding within 10 minutes. These symptoms may represent a serious problem that is an emergency. Do not wait to see if the symptoms will go away. Get medical help right away. Call your local emergency services (911 in the [place] Do not drive yourself to the hospital.    This information is not intended to replace advice given to you by your health care provider. Make sure you discuss any questions you have with your health care provider.   Document Released: 05/18/2004 Document Revised: 08/07/2014 Document Reviewed: 01/01/2014 Elsevier Interactive Patient Education 2016 [place]  A bulb drain consists of a thin rubber tube and a soft, round bulb that creates a gentle suction. The rubber tube is placed in the area where you had surgery. A bulb is attached to the end of the tube that is outside the body. The bulb drain removes excess fluid that normally builds up in a surgical wound after surgery. The color and amount of fluid will vary. Immediately after surgery, the fluid is bright red and is a little thicker than water. It may gradually change to a yellow or pink color and become more thin and water-like. When the amount decreases to about 1 or 2 tbsp in 24 hours, your health care provider will usually remove it. DAILY CARE  Keep the bulb  flat (compressed) at all times, except while emptying it. The flatness creates suction. You can flatten the bulb by squeezing it firmly in the middle and then closing the cap.  Keep sites where the tube enters the skin dry and covered with a bandage (dressing).  Secure the tube 1-2 in (2.5-5.1 cm) below the insertion sites to keep it from pulling on your stitches. The tube is stitched in place and will not slip out.  Secure the bulb as directed by your health care provider.  For the first 3 days after surgery, there usually is more fluid in the bulb. Empty the bulb whenever it becomes half full because the bulb does not create enough suction if it is too full. The bulb could also overflow. Write down how much fluid you remove each time you empty your drain. Add up the amount removed in 24 hours.  Empty the bulb at the same time every day once the amount of fluid decreases and you only need to empty it once a day. Write down the amounts and the 24-hour totals to give  to your health care provider. This helps your health care provider know when the tubes can be removed. EMPTYING THE BULB DRAIN Before emptying the bulb, get a measuring cup, a piece of paper and a pen, and wash your hands.  Gently run your fingers down the tube (stripping) to empty any drainage from the tubing into the bulb. This may need to be done several times a day to clear the tubing of clots and tissue.  Open the bulb cap to release suction, which causes it to inflate. Do not touch the inside of the cap.  Gently run your fingers down the tube (stripping) to empty any drainage from the tubing into the bulb.  Hold the cap out of the way, and pour fluid into the measuring cup.   Squeeze the bulb to provide suction.  Replace the cap.   Check the tape that holds the tube to your skin. If it is becoming loose, you can remove the loose piece of tape and apply a new one. Then, pin the bulb to your shirt.   Write down the  amount of fluid you emptied out. Write down the date and each time you emptied your bulb drain. (If there are 2 bulbs, note the amount of drainage from each bulb and keep the totals separate. Your health care provider will want to know the total amounts for each drain and which tube is draining more.)   Flush the fluid down the toilet and wash your hands.   Call your health care provider once you have less than 2 tbsp of fluid collecting in the bulb drain every 24 hours. If there is drainage around the tube site, change dressings and keep the area dry. Cleanse around tube with sterile saline and place dry gauze around site. This gauze should be changed when it is soiled. If it stays clean and unsoiled, it should still be changed daily.  SEEK MEDICAL CARE IF:  Your drainage has a bad smell or is cloudy.   You have a fever.   Your drainage is increasing instead of decreasing.   Your tube fell out.   You have redness or swelling around the tube site.   You have drainage from a surgical wound.   Your bulb drain will not stay flat after you empty it.  MAKE SURE YOU:   Understand these instructions.  Will watch your condition.  Will get help right away if you are not doing well or get worse.   This information is not intended to replace advice given to you by your health care provider. Make sure you discuss any questions you have with your health care provider.   Document Released: 07/14/2000 Document Revised: 08/07/2014 Document Reviewed: 02/03/2015 Elsevier Interactive Patient Education [employer].

## 2016-05-25 NOTE — Progress Notes (Signed)
Pt discharged home in stable condition. Discharge instructions given with no concerns voiced

## 2016-05-26 ENCOUNTER — Telehealth: Payer: Self-pay | Admitting: Gynecologic Oncology

## 2016-05-26 NOTE — Telephone Encounter (Signed)
Spoke with the patient's mother.  She states he is doing well after surgery and had only 20 cc from the JP drain this am.  Advised to continue to monitor over the weekend and we would touch base on Monday about the output and how he is doing.  Advised to call for any needs.

## 2016-05-29 ENCOUNTER — Telehealth: Payer: Self-pay

## 2016-05-29 NOTE — Telephone Encounter (Signed)
Orders received to contact the patient to see how his Left JP Drainage was doing : 10/27 40 cc 10/28 12 cc 10/29 5 cc Today 10/30 15 cc Patient denies pain or fever , states his Lovenox injections are going well and that they are administering 50 MG of Lovenox S/Q daily as directed. Will update Dr Eddie Dibbles and [facility], APNP.

## 2016-05-30 ENCOUNTER — Telehealth: Payer: Self-pay

## 2016-05-30 NOTE — Telephone Encounter (Signed)
Orders received from [facility] to contact the patient to follow up on his left JP darinage. Patient contacted , patient states his output from yesterday October 30 , 2017 was 15cc , today his output was 5 cc. Patient informed that he may come in tomorrow to have his left JP drain removed. Patient states he can be here tomorrow at 11 AM as requested by Joylene John, APNP. Appointment was scheduled per orders.

## 2016-05-31 ENCOUNTER — Encounter: Payer: Self-pay | Admitting: Gynecologic Oncology

## 2016-05-31 ENCOUNTER — Ambulatory Visit: Payer: Managed Care, Other (non HMO) | Attending: Gynecologic Oncology | Admitting: Gynecologic Oncology

## 2016-05-31 VITALS — BP 125/76 | HR 76 | Temp 98.0°F | Resp 18 | Wt 237.4 lb

## 2016-05-31 DIAGNOSIS — Z8673 Personal history of transient ischemic attack (TIA), and cerebral infarction without residual deficits: Secondary | ICD-10-CM | POA: Diagnosis not present

## 2016-05-31 DIAGNOSIS — Z9889 Other specified postprocedural states: Secondary | ICD-10-CM | POA: Insufficient documentation

## 2016-05-31 DIAGNOSIS — E785 Hyperlipidemia, unspecified: Secondary | ICD-10-CM | POA: Insufficient documentation

## 2016-05-31 DIAGNOSIS — I420 Dilated cardiomyopathy: Secondary | ICD-10-CM | POA: Insufficient documentation

## 2016-05-31 DIAGNOSIS — R2242 Localized swelling, mass and lump, left lower limb: Secondary | ICD-10-CM

## 2016-05-31 DIAGNOSIS — R7989 Other specified abnormal findings of blood chemistry: Secondary | ICD-10-CM

## 2016-05-31 NOTE — Patient Instructions (Signed)
We will call you once your pathology is back and to schedule an appointment in the office for Dr. Rolanda Jay.  Keep the incision clean and dry.  Wash the area daily.  No need to place a dressing unless it is draining to protect your clothing.  Please call our office for any changes in the incision (redness, swelling, draining pus, fever, chills).  Plan to follow up with cardiology as well and you can resume your potassium.  Please call [phone] for any questions or concerns.

## 2016-05-31 NOTE — Progress Notes (Signed)
Follow Up Note: Surgical Oncology  Austin Holmes 45 y.o. male  CC:  Chief Complaint  Patient presents with  . Left thigh mass    post-op, for drain removal    HPI:  Austin Holmes is a 45 year old male who presented with an asymptomatic left proximal medial thigh soft tissue mass that was biopsied with the findings consistent with atypical lipomatous tumor and imaging that was consistent with that histologic appearance. He was lost to follow-up represented to the office due to pain in his right leg despite no significant changes with a mass in his left leg.  Cardiac and neuro clearance was obtained prior to surgery.  On 05/24/16, he underwent a radical resection left proximal medial thigh mass (greater than 5 cm, deep to the fascia) with Dr. Eddie Dibbles.  His post-operative course was uneventful.  Final pathology pending.   Interval History:  He presents today with his mother for post-operative follow up and drain removal.  He reports doing well at home since surgery.  Ambulating without difficulty.  Denies numbness or tingling in the LLE, weakness, or change in gait.  He has kept the dressing on the incision because he did not want it to get infected.  He denies drainage from the incision but it has been covered with a dressing.  Tolerating diet with no nausea or emesis.  Denies fever or chills.  Has six lovenox injections left and is pushing the medication out to achieve 50 mg daily (prescribed 60 mg syringes).  States his JP drain has not been draining much with 15 cc the day before and 5 cc yesterday.  Stating he would also like to proceed with having his cardiac procedure (pacemaker per pt) before going back to work as well.  No concerns voiced.  Review of Systems: Constitutional: Feels well. No fever, chills, early satiety, change in appetite. Cardiovascular: No chest pain, shortness of breath, or edema.  Pulmonary: No cough or wheeze.  Gastrointestinal: No nausea, vomiting,  or diarrhea. No bright red blood per rectum or change in bowel movement.  Genitourinary: No frequency, urgency, or dysuria.  Musculoskeletal: No myalgia or joint pain Neurologic: No weakness, numbness, or change in gait.  Psychology: No depression, anxiety, or insomnia  Current Meds:  Outpatient Encounter Prescriptions as of 05/31/2016  Medication Sig  . aspirin EC 325 MG EC tablet Take 1 tablet (325 mg total) by mouth daily.  Marland Kitchen atorvastatin (LIPITOR) 40 MG tablet Take 1 tablet (40 mg total) by mouth daily at 6 PM.  . carvedilol (COREG) 6.25 MG tablet Take 1.5 tablets (9.375 mg total) by mouth 2 (two) times daily.  Marland Kitchen enoxaparin (LOVENOX) 60 MG/0.6ML injection Inject 0.5 mLs (50 mg total) into the skin daily.  . furosemide (LASIX) 40 MG tablet TAKE ONE TABLET BY MOUTH ONCE DAILY  . losartan (COZAAR) 25 MG tablet TAKE 1 TABLET (25 MG TOTAL) BY MOUTH DAILY.   No facility-administered encounter medications on file as of 05/31/2016.     Allergy: No Known Allergies  Social Hx:   Social History   Social History  . Marital status: Single    Spouse name: N/A  . Number of children: N/A  . 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 Smoking status: Never Smoker  . Smokeless tobacco: Never Used  . Alcohol use 0.0 oz/week     Comment: occasionally drinks beer  . Drug use:  No  . Sexual activity: Not on file   Other Topics Concern  . Not on file   Social History Narrative   Lives with mother [name]   Caffeine use: Soda daily    Past Surgical Hx:  Past Surgical History:  Procedure Laterality Date  . CARDIAC CATHETERIZATION N/A 11/30/2015   Procedure: Left Heart Cath and Coronary Angiography;  Surgeon: Jettie Booze, MD;  Location: [facility];  Service: Cardiovascular;  Laterality: N/A;  . EXCISION OF LEFT THIGH MASS Left 05/24/2016  . MASS EXCISION Left 05/24/2016   Procedure: EXCISION of left thigh MASS;   Surgeon: Hall Busing, MD;  Location: [facility];  Service: General;  Laterality: Left;  . none    . TEE WITHOUT CARDIOVERSION N/A 11/11/2015   Procedure: TRANSESOPHAGEAL ECHOCARDIOGRAM (TEE);  Surgeon: Herminio Commons, MD;  Location: [facility] ENDOSCOPY;  Service: Cardiovascular;  Laterality: N/A;    Past Medical Hx:  Past Medical History:  Diagnosis Date  . DCM (dilated cardiomyopathy) ([place])    Echo 10/17: prominent LV trabeculations, EF 20-25, diff HK, mild LAE  . HLD (hyperlipidemia)   . Myxoid liposarcoma (HCC)    L thigh  . Stroke [facility]) 2017    Family Hx:  Family History  Problem Relation Age of Onset  . Cancer Father     pancreatic  . Diabetes Mellitus II Maternal Grandmother   . Diabetes Mellitus II Maternal Grandfather     Vitals:  Blood pressure 125/76, pulse 76, temperature 98 F (36.7 C), temperature source Oral, resp. rate 18, weight 237 lb 6 oz (107.7 kg).  Physical Exam:  Well nourished, well developed male in no acute distress.  Large stain marked dressing with dried blood to the left thigh.  Large amounts of paper tape removed.  Majority of dermabond from the incision has worn off.  Minimal amount of serous drainage from the middle of the incision.  Incision intact with no erythema or active bleeding.  Dressing around JP drain dried and blood stained.  Dressing removed.  JP drain with minimal amount of serosanguinous drainage.  JP removed without difficulty.  Dry dressing placed and small dressing placed over area on the incision that was draining minimal amount of serous drainage.  Assessment/Plan:  45 year old male s/p resection of a left thigh mass.  Path sent to [facility] for review and still pending.  Incisional care discussed.  Dressing supplies given for as needed basis.  He is advised he will be contacted once his pathology has returned and a follow up appointment with Dr. Rolanda Jay will be made.  Reportable signs and symptoms reviewed with the patient and his  mother.  Advised to call for any needs or concerns.  Will follow up once final path is available.     CROSS, MELISSA DEAL, NP 05/31/2016, 3:00 PM

## 2016-06-01 ENCOUNTER — Other Ambulatory Visit (HOSPITAL_BASED_OUTPATIENT_CLINIC_OR_DEPARTMENT_OTHER): Payer: Managed Care, Other (non HMO)

## 2016-06-01 ENCOUNTER — Telehealth: Payer: Self-pay

## 2016-06-01 DIAGNOSIS — R7989 Other specified abnormal findings of blood chemistry: Secondary | ICD-10-CM

## 2016-06-01 LAB — BASIC METABOLIC PANEL
Anion Gap: 6 mEq/L (ref 3–11)
BUN: 12.4 mg/dL (ref 7.0–26.0)
CO2: 30 mEq/L — ABNORMAL HIGH (ref 22–29)
Calcium: 9.8 mg/dL (ref 8.4–10.4)
Chloride: 103 mEq/L (ref 98–109)
Creatinine: 1.1 mg/dL (ref 0.7–1.3)
EGFR: >90 mL/min/{1.73_m2} (ref >90)
Glucose: 93 mg/dl (ref 70–140)
Potassium: 4.2 mEq/L (ref 3.5–5.1)
Sodium: 140 mEq/L (ref 136–145)

## 2016-06-01 NOTE — Telephone Encounter (Signed)
Attempted to contact the patient with his creatinine level being ( WNL ) . No answer , left a detailed message with call back number if additional questions arise.

## 2016-06-05 ENCOUNTER — Telehealth: Payer: Self-pay | Admitting: Gynecologic Oncology

## 2016-06-05 NOTE — Telephone Encounter (Signed)
Called to check on patient's status.  Patient's mother answered the phone.  States he is doing well with no edema in the left leg.  Follow up appt with Dr. Rolanda Jay made for this Thurs at 12pm.  Advised to call for any needs before that time.  No concerns voiced.

## 2016-06-08 ENCOUNTER — Ambulatory Visit (HOSPITAL_BASED_OUTPATIENT_CLINIC_OR_DEPARTMENT_OTHER): Payer: Self-pay | Admitting: General Surgery

## 2016-06-08 ENCOUNTER — Encounter: Payer: Self-pay | Admitting: General Surgery

## 2016-06-08 VITALS — BP 122/75 | HR 75 | Temp 98.3°F | Resp 18 | Wt 240.5 lb

## 2016-06-08 DIAGNOSIS — C4922 Malignant neoplasm of connective and soft tissue of left lower limb, including hip: Secondary | ICD-10-CM

## 2016-06-08 DIAGNOSIS — Z7189 Other specified counseling: Secondary | ICD-10-CM

## 2016-06-08 DIAGNOSIS — C499 Malignant neoplasm of connective and soft tissue, unspecified: Secondary | ICD-10-CM

## 2016-06-08 NOTE — Patient Instructions (Addendum)
Plan to meet with Dr. Tammi Klippel in Radiation Oncology on November 20th, 2017 at 1:30pm at the [facility]. Arrive at 1:15pm for this appt.  Plan to have an MRI of the left femur in April with a follow up with Dr. Rolanda Jay after.  Please call for any questions or concerns.

## 2016-06-08 NOTE — Progress Notes (Signed)
History of presenting illness I had the pleasure of meeting with Austin Holmes today in routine follow-up status post resection of left proximal medial thigh soft tissue mass. He reports no specific complaints, he is able to cannulate without any deficits. His drain has been removed since his discharge and he reports only scant amounts of serous segment was drainage from the drain site. He denies any fevers, chills or other complaints.  Physical examination: On focused physical examination of his incision there is no evidence of erythema, warmth or discharge. The wound is healing without abnormality. There is no evidence of fluid collection. There is no active drainage at the present time.  Pathology: Diagnosis Soft tissue tumor, extensive resection, Left Proximal Medial Thigh FINAL REPORT: ATYPICAL SPINDLE CELL LIPOMATOUS TUMOR, SEE COMMENT Microscopic Comment SARCOMA AND SOFT TISSUE Procedure: Extensive resection. Tumor site: Left proximal medial thigh. Tumor size: 9.0 cm from superior to inferior, by 6.0 cm medial to lateral, by 6.8 cm anterior to posterior. 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 Mitotic rate (# per 10 hpf): 0 per 10 HPF. Necrosis (quantify percentage of tumor necrosis if present): None. Grade: N/A Margins: Closest margins: 0.1 cm from posterior, 0.2 cm from lateral, 0.5 cm from inferior margin. Distance of tumor from closest margin: See above Lymph - Vascular invasion: Absent. Perineural involvement: Absent. Lymph nodes: # examined - 0 ; # positive - 0 TNM: pTx , pNx Ancillary studies: None. Pre-resection treatment: None. Treatment effect: None. Comments: Due to the unusual morphology in this case, this case was sent to Dr. Jeani Hawking at [facility] for his expert opinion. The final results are rendered below per Dr. Irene Shipper: "I have reviewed the slides representing a resection of the left thigh mass from the  above patient and believe this to be an excellent example of a tumor originally described as spindle cell liposarcoma and more recently described under the terms fibrosarcoma-like lipomatous neoplasm and atypical spindle cell lipomatous tumor. The present case is interesting, in as much as it shows rather extensive myxoid change, a finding which initially raised the possibility of some type of myxoid soft tissue tumor. However, much of this lesion shows much more typical features of an atypical spindle cell lipomatous tumor with a fascicular proliferation of hyperchromatic, atypical-appearing spindled cells showing multi-focal lipoblastic differentiation, as well as formation of relatively mature fat. The classification and natural history of these tumors has been the subject of some debate and it has never been quite clear whether these represent a distinct entity, an unusual variant of well differentiated liposarcoma, or even an atypical form of spindle cell lipoma. The best evidence to date suggests that these are rather low grade forms of liposarcoma and the term atypical spindle cell lipomatous tumor has recently been proposed for these tumors. This term reflects the extremely low metastatic risk for these tumors. In the areas where I can evaluate the surgical margins, they seem to be histologically negative and thus I think it would also be reasonable to follow this patient for local recurrence." I spoke with Dr. Rolanda Jay about this case on 05/29/16. Reference: Catroppo JF, 2002. Diagnostic Cytopathology. Duard Brady MD Dermatopathologist, Electronic Signature (Case signed 06/02/2016) Specimen Gross and Clinical Information Specimen(s) Obtained: Soft tissue tumor, extensive resection, Left Proximal Medial Thigh Specimen Clinical Impression: Status post resection of a T2b,N0,M0,G0, atypical spindle cell lipomatous tumor. Margins microscopically close Plan: At a long discussion  with the patient and reviewed the pathologic findings and  stage of the disease. A copy of his pathology report was provided to him. I indicated that this has low metastatic potential however he may be at risk for local recurrence and it may take a prolonged period of time before recurrence. I have asked that he meet with Dr. Tammi Klippel of radiation oncology to discuss the risk and benefits of adjuvant radiation therapy. If he elects to move forward with observation alone and I will plan to see him back in 6 months time with an MRI scan and physical examination. He understands and agrees with the plan as outlined above. I will tentatively see him back in 6 months.

## 2016-06-19 ENCOUNTER — Telehealth: Payer: Self-pay | Admitting: Radiation Oncology

## 2016-06-19 ENCOUNTER — Ambulatory Visit: Payer: Managed Care, Other (non HMO)

## 2016-06-19 ENCOUNTER — Ambulatory Visit
Admission: RE | Admit: 2016-06-19 | Discharge: 2016-06-19 | Disposition: A | Discharge status: Home / Self Care | Payer: Managed Care, Other (non HMO) | Source: Ambulatory Visit | Attending: Radiation Oncology | Admitting: Radiation Oncology

## 2016-06-19 DIAGNOSIS — C4922 Malignant neoplasm of connective and soft tissue of left lower limb, including hip: Secondary | ICD-10-CM | POA: Insufficient documentation

## 2016-06-19 DIAGNOSIS — Z800 Family history of malignant neoplasm of digestive organs: Secondary | ICD-10-CM | POA: Insufficient documentation

## 2016-06-19 DIAGNOSIS — Z8673 Personal history of transient ischemic attack (TIA), and cerebral infarction without residual deficits: Secondary | ICD-10-CM | POA: Insufficient documentation

## 2016-06-19 DIAGNOSIS — I420 Dilated cardiomyopathy: Secondary | ICD-10-CM | POA: Insufficient documentation

## 2016-06-19 DIAGNOSIS — C499 Malignant neoplasm of connective and soft tissue, unspecified: Secondary | ICD-10-CM

## 2016-06-19 DIAGNOSIS — E785 Hyperlipidemia, unspecified: Secondary | ICD-10-CM | POA: Insufficient documentation

## 2016-06-19 DIAGNOSIS — Z7982 Long term (current) use of aspirin: Secondary | ICD-10-CM | POA: Insufficient documentation

## 2016-06-19 DIAGNOSIS — Z79899 Other long term (current) drug therapy: Secondary | ICD-10-CM | POA: Insufficient documentation

## 2016-06-19 NOTE — Telephone Encounter (Signed)
Patient did not show for 1330 appointment. Phoned to inquire. No answer. Left message requesting return call.

## 2016-06-20 ENCOUNTER — Ambulatory Visit: Payer: Managed Care, Other (non HMO) | Admitting: Neurology

## 2016-06-21 ENCOUNTER — Institutional Professional Consult (permissible substitution): Payer: Managed Care, Other (non HMO) | Admitting: Cardiovascular Disease

## 2016-06-29 ENCOUNTER — Ambulatory Visit
Admission: RE | Admit: 2016-06-29 | Discharge: 2016-06-29 | Disposition: A | Discharge status: Home / Self Care | Payer: Self-pay | Source: Ambulatory Visit | Attending: Radiation Oncology | Admitting: Radiation Oncology

## 2016-06-29 ENCOUNTER — Ambulatory Visit
Admission: RE | Admit: 2016-06-29 | Discharge: 2016-06-29 | Disposition: A | Discharge status: Home / Self Care | Payer: Managed Care, Other (non HMO) | Source: Ambulatory Visit | Attending: Radiation Oncology | Admitting: Radiation Oncology

## 2016-06-29 ENCOUNTER — Encounter: Payer: Self-pay | Admitting: Radiation Oncology

## 2016-06-29 ENCOUNTER — Telehealth: Payer: Self-pay | Admitting: *Deleted

## 2016-06-29 DIAGNOSIS — C4922 Malignant neoplasm of connective and soft tissue of left lower limb, including hip: Secondary | ICD-10-CM

## 2016-06-29 DIAGNOSIS — C499 Malignant neoplasm of connective and soft tissue, unspecified: Secondary | ICD-10-CM

## 2016-06-29 NOTE — Progress Notes (Signed)
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 Excision of left thigh mass occurred 05/24/16  Past/Anticipated chemotherapy by medical oncology, if any: no  Pain on a scale of 0-10 is: patient reports constant right leg pain from hip to foot 9 on a scale of 0-10. Patient reports that he was told the right leg pain would resolve once his left leg healed but, "this hasn't happened." Reports pain is more severe or "aches worse" when its cold.   Ambulatory status? Walker? Wheelchair?: ambulatory  SAFETY ISSUES:  Prior radiation? no        Pacemaker/ICD? no  Possible current pregnancy? no  Is the patient on methotrexate? no  Additional Complaints / other details: 45 year old male. Past history of stroke that affected his right side. Denies any bowel or bladder complaints. Reports excision scar and stent site has healed well. Site is well approximated without redness, drainage, edema or pain. 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

## 2016-06-29 NOTE — Progress Notes (Signed)
[place]         [phone] ________________________________  Initial outpatient Consultation  Name: Austin Holmes MRN: [id number]  Date: 06/29/2016  DOB: [birth year]  REFERRING PHYSICIAN: Hall Busing, MD  DIAGNOSIS: The encounter diagnosis was Liposarcoma of left thigh ([place]).    ICD-9-CM ICD-10-CM   1. Liposarcoma of left thigh ([place]) 171.3 C49.22     HISTORY OF PRESENT ILLNESS::[name] is a 45 y.o. male who originally presented with a left proximal thigh lesion which appeared approximately one year ago and began to grow so he went to [facility] ED for evaluation. MR Femur on 05/16/2016 showed a stable 7 x 7.4 x 9.3 cm well-circumscribed mass with heterogeneous nodular enhancement, located between the upper left sartorius and adductor longus muscle adjacent to the femoral neurovascular bundle. Excision of the left thigh mass occurred 05/24/2016 with Dr. Rolanda Jay. Pathology showed atypical spindle cell lipomatous tumor.  Since this is a relatively rare entity, the case was sent to Dr. Jeani Hawking at [facility] for his expert opinion. The final results are rendered below per Dr. Irene Shipper: "I have reviewed the slides representing a resection of the left thigh mass from the above patient and  elieve this to be an excellent example of a tumor originally described as spindle cell liposarcoma and more recently described under the terms fibrosarcoma-like lipomatous neoplasm and atypical spindle cell lipomatous tumor. The present case is interesting, in as much as it shows rather extensive myxoid change, a finding which initially raised the possibility of some type of myxoid soft tissue tumor. However, much of this lesion shows much more typical features of an atypical spindle cell lipomatous tumor with a fascicular proliferation of hyperchromatic, atypical-appearing spindled cells showing multi-focal lipoblastic differentiation, as well as formation of  relatively mature fat. The classification and natural history of these tumors has been the subject of some debate and it has never been quite clear whether these represent a distinct entity, an unusual variant of well differentiated liposarcoma, or even an atypical form of spindle cell lipoma. The best evidence to date suggests that these are rather low grade forms of liposarcoma and the term atypical spindle cell lipomatous tumor has recently been proposed for these tumors. This term reflects the extremely low metastatic risk for these tumors. In the areas where I can evaluate the surgical margins, they seem to be histologically negative and thus I think it would also be reasonable to follow this patient for local recurrence."  --  "I spoke with Dr. Rolanda Jay about this case on 05/29/16."  He has been referred today for multidisciplinary input and consultation.   PREVIOUS RADIATION THERAPY: No  Past Medical History:  Diagnosis Date  . DCM (dilated cardiomyopathy) ([place])    Echo 10/17: prominent LV trabeculations, EF 20-25, diff HK, mild LAE  . HLD (hyperlipidemia)   . Myxoid liposarcoma ([facility])    L thigh  . Stroke [facility]) 2017  :   Past Surgical History:  Procedure Laterality Date  . CARDIAC CATHETERIZATION N/A 11/30/2015   Procedure: Left Heart Cath and Coronary Angiography;  Surgeon: Jettie Booze, MD;  Location: [place] [facility];  Service: Cardiovascular;  Laterality: N/A;  . EXCISION OF LEFT THIGH MASS Left 05/24/2016  . MASS EXCISION Left 05/24/2016   Procedure: EXCISION of left thigh MASS;  Surgeon: Hall Busing, MD;  Location: [facility];  Service: General;  Laterality: Left;  . none    . TEE WITHOUT CARDIOVERSION N/A  11/11/2015   Procedure: TRANSESOPHAGEAL ECHOCARDIOGRAM (TEE);  Surgeon: Herminio Commons, MD;  Location: [facility] ENDOSCOPY;  Service: Cardiovascular;  Laterality: N/A;  :   Current Outpatient Prescriptions:  .  aspirin EC 325 MG EC tablet, Take 1 tablet (325 mg total)  by mouth daily., Disp: 100 tablet, Rfl: 0 .  atorvastatin (LIPITOR) 40 MG tablet, Take 1 tablet (40 mg total) by mouth daily at 6 PM., Disp: 30 tablet, Rfl: 11 .  carvedilol (COREG) 6.25 MG tablet, Take 1.5 tablets (9.375 mg total) by mouth 2 (two) times daily., Disp: 90 tablet, Rfl: 6 .  furosemide (LASIX) 40 MG tablet, TAKE ONE TABLET BY MOUTH ONCE DAILY, Disp: 30 tablet, Rfl: 10 .  losartan (COZAAR) 25 MG tablet, TAKE 1 TABLET (25 MG TOTAL) BY MOUTH DAILY., Disp: 30 tablet, Rfl: 11:  No Known Allergies:   Family History  Problem Relation Age of Onset  . Cancer Father     pancreatic  . Diabetes Mellitus II Maternal Grandmother   . Diabetes Mellitus II Maternal Grandfather   :   Social History   Social History  . Marital status: Single    Spouse name: N/A  . Number of children: N/A  . 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 Smoking status: Never Smoker  . Smokeless tobacco: Never Used  . Alcohol use 0.0 oz/week     Comment: occasionally drinks beer  . Drug use: No  . Sexual activity: Not Currently   Other Topics Concern  . Not on file   Social History Narrative   Lives with mother [name]   Caffeine use: Soda daily  :  REVIEW OF SYSTEMS:  A 15 point review of systems is documented in the electronic medical record. This was obtained by the nursing staff. However, I reviewed this with the patient to discuss relevant findings and make appropriate changes.    On review of systems, the patient reports that he is doing well overall. He denies any chest pain, shortness of breath, cough, fevers, chills, night sweats, unintended weight changes. He denies any bowel or bladder disturbances, and denies abdominal pain, nausea or vomiting. He reports constant right leg pain from hip to foot 9 on a scale of 0-10. Reports that he was told the right leg pain would resolve once his left leg healed but, "this hasn't happened".  Reports pain is more severe or "aches worse" when it's cold. A complete review of systems is obtained and is otherwise negative.    PHYSICAL EXAM:  Blood pressure 127/82, pulse 72, resp. rate 18, height 6\' 1"  (1.854 m), weight 239 lb (108.4 kg), SpO2 100 %.   In general this is a well appearing African-American male in no acute distress. He's alert and oriented x4 and appropriate throughout the examination. Cardiopulmonary assessment is negative for acute distress and he exhibits normal effort. Patient's left anterior thigh is notable for a well healed incision with no evidence of infection or drainage.    KPS = 90  100 - Normal; no complaints; no evidence of disease. 90   - Able to carry on normal activity; minor signs or symptoms of disease. 80   - Normal activity with effort; some signs or symptoms of disease. 70   - Cares for self; unable to carry on normal activity or to do active work. 60   - Requires occasional assistance, but is able to care for  most of his personal needs. 50   - Requires considerable assistance and frequent medical care. 51   - Disabled; requires special care and assistance. 61   - Severely disabled; hospital admission is indicated although death not imminent. 54   - Very sick; hospital admission necessary; active supportive treatment necessary. 10   - Moribund; fatal processes progressing rapidly. 0     - Dead  Karnofsky DA, Abelmann Hurt, Craver LS and Burchenal [facility] [phone]) The use of the nitrogen mustards in the palliative treatment of carcinoma: with particular reference to bronchogenic carcinoma Cancer 1 634-56  LABORATORY DATA:  Lab Results  Component Value Date   WBC 6.2 05/25/2016   HGB 13.2 05/25/2016   HCT 40.0 05/25/2016   MCV 85.3 05/25/2016   PLT 165 05/25/2016   Lab Results  Component Value Date   NA 140 06/01/2016   K 4.2 06/01/2016   CL 100 (L) 05/25/2016   CO2 30 (H) 06/01/2016   Lab Results  Component Value Date   ALT 26 05/19/2016    AST 26 05/19/2016   ALKPHOS 43 05/19/2016   BILITOT 0.7 05/19/2016     RADIOGRAPHY: No results found.    IMPRESSION: Mr. 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 Given the nature of the process, the patient would be a candidate for observation with adjuvant radiation treatment if clinically indicated in the future.   PLAN: Today, I talked to the patient about the findings and work-up thus far.  We discussed the natural history of atypical spindle cell lipomatous tumors and general treatment, highlighting the role of radiotherapy in the management.  We discussed the available radiation techniques, and focused on the details of logistics and delivery.  We reviewed the anticipated acute and late sequelae associated with radiation in this setting.  The patient was encouraged to ask questions that I answered to the best of my ability. The patient would like to proceed with observation at this time. He will continue to follow with Dr. Rolanda Jay and will return to our clinic as needed.   I spent 60 minutes minutes face to face with the patient and more than 50% of that time was spent in counseling and/or coordination of care.    ------------------------------------------------   Tyler Pita, MD [place] Director and Director of Stereotactic Radiosurgery Direct Dial: [phone]  Fax: [phone] [email or web address]  Skype  [email or web address]  This document serves as a record of services personally performed by Tyler Pita, MD. It was created on his behalf by Arlyce Harman, a trained medical scribe. The creation of this record is based on the scribe's personal observations and the provider's statements to them. This document has been checked and approved by the attending provider.

## 2016-06-29 NOTE — Progress Notes (Signed)
See progress note under physician encounter. 

## 2016-06-29 NOTE — Telephone Encounter (Signed)
Patient's mother called to confirm appointment and location.  This nurse provided appointment information.  Call lost before transfer to radiation Oncology.  Call dropped also with attempt to return call.  Mother call ed back, with th is call shared confirmation from Radiation staff.  Instructed to arrive early to register and [facility] is beside [facility].

## 2016-07-03 ENCOUNTER — Encounter: Payer: Self-pay | Admitting: *Deleted

## 2016-07-03 ENCOUNTER — Institutional Professional Consult (permissible substitution): Payer: Managed Care, Other (non HMO) | Admitting: Cardiovascular Disease

## 2016-07-05 ENCOUNTER — Encounter: Payer: Self-pay | Admitting: Neurology

## 2016-07-05 ENCOUNTER — Ambulatory Visit (INDEPENDENT_AMBULATORY_CARE_PROVIDER_SITE_OTHER): Payer: Managed Care, Other (non HMO) | Admitting: Neurology

## 2016-07-05 DIAGNOSIS — R202 Paresthesia of skin: Secondary | ICD-10-CM | POA: Insufficient documentation

## 2016-07-05 MED ORDER — TOPIRAMATE 25 MG PO TABS: 25.0000 mg | ORAL_TABLET | Freq: Two times a day (BID) | ORAL | 3 refills | Status: DC

## 2016-07-05 NOTE — Progress Notes (Signed)
[facility] [address] [place]. [place] [address] [phone]       OFFICE FOLLOW UP VISIT NOTE  Austin. Austin Holmes Date of Birth:  [birth year] Medical Record Number:  [id number]   Referring MD: Alysia Penna  Reason for Referral:  Embolic stroke HPI: Initial Consult 05/10/2016 : Austin Holmes is a 22 year African-American male who has an embolic stroke In April 5003 and  Referred to me by Dr. Kayren Eaves but did not keep 2 separate appointments and canceled. He was recently seen by Dr. Rolanda Jay surgical oncologist for elective removal of left thigh tumor and hence was referred to me for neurological clearance. History is obtained from the patient, wife and review of hospital consultation note by Dr. Doy Mince at [facility] on 11/01/05/2017. He presented with difficulty walking and headache with regard versus the day prior to admission. Initial CT scan that was unremarkable but MRI scan showed patchy embolic infarct involving medial left temporal lobe and extending into the thalamus. MRA showed occlusion of the left posterior cerebral artery just beyond the P1 segment. Transverse he Echo showed ejection fraction of 20-25% but no definite clot. LDL cholesterol was borderline at 104 mg percent. Patient had heme-negative panel, ESR, protein C&S, lupus anticoagulant, factor V Leiden, antithrombin III, homocystine and anticardiolipin antibodies all of which were normal. Hemoglobin A1c was 5.0. Patient subsequently had an outpatient TEE performed on 4/13 2017 at [facility] which confirmed a low ejection fraction but did not show any definite intra-atrial clot or PFO. Patient was seen by Dr. Joylene Igo from rehabilitation and was referred to me but he twice no showed for his appointments in July and August 2017. Patient also has incidental left thigh lipomatous tumor for which he requires surgical excision and saw Dr. Rolanda Jay who referred the patient  back to me for neurological clearance for his surgery. Patient states his done well since his stroke. his physical deficits his back to his baseline and able to work. He is tolerating aspirin well without bruising or bleeding. He has no complaints today. He is eating healthy and exercising in fact has lost about 20 pounds. Update 07/05/2016 : He returns for follow-up after last visit 2 months ago. He has not had any recurrent stroke or TIA symptoms. He remains on aspirin which is tolerating well without significant bleeding or bruising. He is tolerating Lipitor well and had lipid profile checked a week ago and was satisfactory. He has however does have some muscle aches and pains. States his blood pressure is well controlled and today it is 130/81. He has been eating healthy and in fact has lost some weight. He does exercise regularly. Patient did undergo removal of her left thigh tumor by Dr. Rolanda Jay and surgery went well. Patient has new complaint of right arm and leg discomfort and paresthesias. This occurs intermittently and is more towards the afternoon and on days and has been quite active. He has not been taking any medications for this. He notices after his strokes but has not been getting better. Patient states that he does not have a job or insurance and will not be able to afford medications. ROS:   14 system review of systems is positive for  right arm and leg paresthesias, aching muscles,   and all other systems negative  PMH:  Past Medical History:  Diagnosis Date  . DCM (dilated cardiomyopathy) ([place])    Echo 10/17: prominent LV trabeculations, EF 20-25, diff HK, mild LAE  .  HLD (hyperlipidemia)   . Myxoid liposarcoma (HCC)    L thigh  . Stroke [facility]) 2017    Social History:  Social History   Social History  . Marital status: Single    Spouse name: N/A  . Number of children: N/A  . Years of education: N/A   Occupational History  . [employer]    Social History Main  Topics  . Smoking status: Never Smoker  . Smokeless tobacco: Never Used  . Alcohol use 0.0 oz/week     Comment: occasionally drinks beer  . Drug use: No  . Sexual activity: Not Currently   Other Topics Concern  . Not on file   Social History Narrative   Lives with mother [name]   Caffeine use: Soda daily    Medications:   Current Outpatient Prescriptions on File Prior to Visit  Medication Sig Dispense Refill  . aspirin EC 325 MG EC tablet Take 1 tablet (325 mg total) by mouth daily. 100 tablet 0  . atorvastatin (LIPITOR) 40 MG tablet Take 1 tablet (40 mg total) by mouth daily at 6 PM. 30 tablet 11  . carvedilol (COREG) 6.25 MG tablet Take 1.5 tablets (9.375 mg total) by mouth 2 (two) times daily. 90 tablet 6  . furosemide (LASIX) 40 MG tablet TAKE ONE TABLET BY MOUTH ONCE DAILY 30 tablet 10  . losartan (COZAAR) 25 MG tablet TAKE 1 TABLET (25 MG TOTAL) BY MOUTH DAILY. 30 tablet 11   No current facility-administered medications on file prior to visit.     Allergies:  No Known Allergies  Physical Exam General: well developed, well nourished middle aged African-American male, seated, in no evident distress Head: head normocephalic and atraumatic.   Neck: supple with no carotid or supraclavicular bruits Cardiovascular: regular rate and rhythm, no murmurs Musculoskeletal: no deformity Skin:  no rash/petichiae Vascular:  Normal pulses all extremities  Neurologic Exam Mental Status: Awake and fully alert. Oriented to place and time. Recent and remote memory intact. Attention span, concentration and fund of knowledge appropriate. Mood and affect appropriate.  Cranial Nerves: Fundoscopic exam not done. Pupils equal, briskly reactive to light. Extraocular movements full without nystagmus. Visual fields full to confrontation. Hearing intact. Facial sensation intact. Face, tongue, palate moves normally and symmetrically.  Motor: Normal bulk and tone. Normal strength in all tested  extremity muscles. Sensory.: intact to touch , pinprick , position and vibratory sensation.  Coordination: Rapid alternating movements normal in all extremities. Finger-to-nose and heel-to-shin performed accurately bilaterally. Gait and Station: Arises from chair without difficulty. Stance is normal. Gait demonstrates normal stride length and balance . Able to heel, toe and tandem walk without difficulty.  Reflexes: 1+ and symmetric. Toes downgoing.   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 He is clinically doing quite well with no deficits. He also has lipomatous tumor in his left thigh for which he has had elective surgical removal by Dr. Rolanda Jay. New complaints of right-sided paresthesias likely due to post stroke dysesthesias.    PLAN: I had a long d/w patient about his remote stroke,post stroke dysesthesias, risk for recurrent stroke/TIAs, personally independently reviewed imaging studies and stroke evaluation results and answered questions.Continue aspirin 325 mg daily  for secondary stroke prevention and maintain strict control of hypertension with blood pressure goal below  130/90, diabetes with hemoglobin A1c goal below 6.5% and lipids with LDL cholesterol goal below 70 mg/dL. I also advised the patient to eat a healthy diet with plenty of whole grains, cereals, fruits and vegetables, exercise regularly and maintain ideal body weight. Recommend trial of Topamax 25 mg twice daily for his post stroke dysesthesias and to be increase as tolerated. Greater than 50% time during this 25 minute visit was spent on counseling and coordination of care about his stroke and paresthesias Followup in the future with me only as necessary and no scheduled appointment was made Antony Contras,  MD  [facility] [address] [place], [place] [address]  Phone [phone] Fax [phone] Note: This document was prepared with digital dictation and possible smart phrase technology. Any transcriptional errors that result from this process are unintentional.

## 2016-07-05 NOTE — Patient Instructions (Addendum)
    Paresthesia Introduction Paresthesia is a burning or prickling feeling. This feeling can happen in any part of the body. It often happens in the hands, arms, legs, or feet. Usually, it is not painful. In most cases, the feeling goes away in a short time and is not a sign of a serious problem. Follow these instructions at home:  Avoid drinking alcohol.  Try massage or needle therapy (acupuncture) to help with your problems.  Keep all follow-up visits as told by your doctor. This is important. Contact a doctor if:  You keep on having episodes of paresthesia.  Your burning or prickling feeling gets worse when you walk.  You have pain or cramps.  You feel dizzy.  You have a rash. Get help right away if:  You feel weak.  You have trouble walking or moving.  You have problems speaking, understanding, or seeing.  You feel confused.  You cannot control when you pee (urinate) or poop (bowel movement).  You lose feeling (numbness) after an injury.  You pass out (faint). This information is not intended to replace advice given to you by your health care provider. Make sure you discuss any questions you have with your health care provider. Document Released: 06/29/2008 Document Revised: 12/23/2015 Document Reviewed: 07/13/2014  2017 Elsevier

## 2016-07-10 ENCOUNTER — Other Ambulatory Visit: Payer: Self-pay | Admitting: Physician Assistant

## 2016-07-10 DIAGNOSIS — E785 Hyperlipidemia, unspecified: Secondary | ICD-10-CM

## 2016-07-10 DIAGNOSIS — I5022 Chronic systolic (congestive) heart failure: Secondary | ICD-10-CM

## 2016-07-10 DIAGNOSIS — I429 Cardiomyopathy, unspecified: Secondary | ICD-10-CM

## 2016-07-10 DIAGNOSIS — C499 Malignant neoplasm of connective and soft tissue, unspecified: Secondary | ICD-10-CM

## 2016-07-10 DIAGNOSIS — Z8673 Personal history of transient ischemic attack (TIA), and cerebral infarction without residual deficits: Secondary | ICD-10-CM

## 2016-08-01 ENCOUNTER — Institutional Professional Consult (permissible substitution): Payer: Managed Care, Other (non HMO) | Admitting: Cardiovascular Disease

## 2016-08-07 ENCOUNTER — Telehealth: Payer: Self-pay | Admitting: Physician Assistant

## 2016-08-07 ENCOUNTER — Encounter: Payer: Self-pay | Admitting: Internal Medicine

## 2016-08-07 NOTE — Telephone Encounter (Signed)
-----   Message from Michae Kava, [place] sent at 08/07/2016 12:37 PM EST ----- Thank you  ----- Message ----- From: Debbora Dus Sent: 08/07/2016  12:31 PM To: Michae Kava, CMA  FYI:  This patient was called 05-23-16 to make EP appt for low EF per [facility]. Pt stated he had surgery the next day and would call back to schedule appt. He was also left a message to call by [place] on 07-10-16 and 07-14-16, then by [place] on 07-25-16, with no response. We contact three times then delete then referral, he  has had more than than the three contacts and has been taken out of the referral que.   Thanks, [employer]

## 2016-08-17 ENCOUNTER — Ambulatory Visit: Payer: Managed Care, Other (non HMO) | Admitting: Cardiovascular Disease

## 2016-08-23 ENCOUNTER — Ambulatory Visit (INDEPENDENT_AMBULATORY_CARE_PROVIDER_SITE_OTHER): Payer: Self-pay | Admitting: Cardiovascular Disease

## 2016-08-23 ENCOUNTER — Encounter: Payer: Self-pay | Admitting: Cardiovascular Disease

## 2016-08-23 VITALS — BP 153/100 | HR 89 | Ht 74.0 in | Wt 246.8 lb

## 2016-08-23 DIAGNOSIS — Z8673 Personal history of transient ischemic attack (TIA), and cerebral infarction without residual deficits: Secondary | ICD-10-CM

## 2016-08-23 DIAGNOSIS — F1010 Alcohol abuse, uncomplicated: Secondary | ICD-10-CM

## 2016-08-23 DIAGNOSIS — I10 Essential (primary) hypertension: Secondary | ICD-10-CM

## 2016-08-23 DIAGNOSIS — C499 Malignant neoplasm of connective and soft tissue, unspecified: Secondary | ICD-10-CM

## 2016-08-23 DIAGNOSIS — I420 Dilated cardiomyopathy: Secondary | ICD-10-CM

## 2016-08-23 DIAGNOSIS — I5022 Chronic systolic (congestive) heart failure: Secondary | ICD-10-CM

## 2016-08-23 MED ORDER — CARVEDILOL 25 MG PO TABS: 25.0000 mg | ORAL_TABLET | Freq: Two times a day (BID) | ORAL | 3 refills | Status: DC

## 2016-08-23 MED ORDER — LOSARTAN POTASSIUM 100 MG PO TABS: 100.0000 mg | ORAL_TABLET | Freq: Every day | ORAL | 3 refills | Status: DC

## 2016-08-23 NOTE — Progress Notes (Signed)
Cardiology Office Note  Date:  08/23/2016   ID:  Austin Holmes, DOB 08-26-1970, MRN [id number]  PCP:  [name] Patient   Chief Complaint  Patient presents with  . other    Ref. [facility] F/u echo no complaints today. Meds reviewed verbally with pt.    HPI:  46 year old gentleman with nonischemic dilated cardiomyopathy, ejection fraction 25%, cardiac catheterization May 2017 with no coronary disease,history of chronic alcohol use/beer daily, History of stroke April 2017, left bundle branch block  Who presents to establish care in the [facility] cardiology office for his dilated cardiomyopathy  Recently in the hospital October 2017 for left proximal thigh lipomatous tumor, liposarcoma He had radical resection, this measured 7 cm x 9 cm  Echocardiogram 05/18/2016 discussed with him in detail, as well as prior study April 2017 Ejection fraction 20-25%, diffuse hypokinesis,  Previous echocardiogram April 2017 with ejection fraction 20%  Prior carotid ultrasound with no stenosis by report  In follow-up today, he does not check his blood pressure at home, overall feels well Reports having recovered from surgery on his left leg well without complications Though he does report having pain in his right thigh, right arm, painful to palpation of the muscle Has not been back to work as he is concerned about standing on cement for long periods of time with his right leg pain. Pain is nonradiating, only when he squeezes on his thigh with his hand or stands on it for long period of time.  Denies any significant shortness of breath on exertion, reports having good energy, no PND orthopnea, no significant weight gain  Blood pressure elevated on today's visit Discussed previous stroke with him   PMH:   has a past medical history of DCM (dilated cardiomyopathy) ([place]); HLD (hyperlipidemia); Myxoid liposarcoma ([place]); and Stroke ([place]) (2017).  PSH:    Past Surgical History:  Procedure  Laterality Date  . CARDIAC CATHETERIZATION N/A 11/30/2015   Procedure: Left Heart Cath and Coronary Angiography;  Surgeon: Jettie Booze, MD;  Location: [facility];  Service: Cardiovascular;  Laterality: N/A;  . EXCISION OF LEFT THIGH MASS Left 05/24/2016  . MASS EXCISION Left 05/24/2016   Procedure: EXCISION of left thigh MASS;  Surgeon: Hall Busing, MD;  Location: [facility];  Service: General;  Laterality: Left;  . none    . TEE WITHOUT CARDIOVERSION N/A 11/11/2015   Procedure: TRANSESOPHAGEAL ECHOCARDIOGRAM (TEE);  Surgeon: Herminio Commons, MD;  Location: [facility];  Service: Cardiovascular;  Laterality: N/A;    Current Outpatient Prescriptions  Medication Sig Dispense Refill  . aspirin EC 325 MG EC tablet Take 1 tablet (325 mg total) by mouth daily. 100 tablet 0  . atorvastatin (LIPITOR) 40 MG tablet Take 1 tablet (40 mg total) by mouth daily at 6 PM. 30 tablet 11  . furosemide (LASIX) 40 MG tablet TAKE ONE TABLET BY MOUTH ONCE DAILY 30 tablet 10  . topiramate (TOPAMAX) 25 MG tablet Take 1 tablet (25 mg total) by mouth 2 (two) times daily. 60 tablet 3  . carvedilol (COREG) 25 MG tablet Take 1 tablet (25 mg total) by mouth 2 (two) times daily. 180 tablet 3  . losartan (COZAAR) 100 MG tablet Take 1 tablet (100 mg total) by mouth daily. 90 tablet 3   No current facility-administered medications for this visit.      Allergies:   Patient has no known allergies.   Social History:  The patient  reports that he has never  smoked. He has never used smokeless tobacco. He reports that he drinks alcohol. He reports that he does not use drugs.   Family History:   family history includes Cancer in his father; Diabetes Mellitus II in his maternal grandfather and maternal grandmother.    Review of Systems: Review of Systems  Constitutional: Negative.   Respiratory: Negative.   Cardiovascular: Negative.   Gastrointestinal: Negative.   Musculoskeletal: Positive for myalgias.        Right leg and right arm pain  Neurological: Negative.   Psychiatric/Behavioral: Negative.   All other systems reviewed and are negative.    PHYSICAL EXAM: VS:  BP (!) 153/100 (BP Location: Left Arm, Patient Position: Sitting, Cuff Size: Large)   Pulse 89   Ht 6\' 2"  (1.88 m)   Wt 246 lb 12 oz (111.9 kg)   BMI 31.68 kg/m  , BMI Body mass index is 31.68 kg/m. GEN: Well nourished, well developed, in no acute distress  HEENT: normal  Neck: no JVD, carotid bruits, or masses Cardiac: RRR; no murmurs, rubs, or gallops,no edema  Respiratory:  clear to auscultation bilaterally, normal work of breathing GI: soft, nontender, nondistended, + BS MS: no deformity or atrophy  Skin: warm and dry, no rash Neuro:  Strength and sensation are intact Psych: euthymic mood, full affect    Recent Labs: 05/19/2016: ALT 26 05/25/2016: Hemoglobin 13.2; Platelets 165 06/01/2016: BUN 12.4; Creatinine 1.1; Potassium 4.2; Sodium 140    Lipid Panel Lab Results  Component Value Date   CHOL 202 (H) 11/02/2015   HDL 91 11/02/2015   LDLCALC 104 (H) 11/02/2015   TRIG 37 11/02/2015      Wt Readings from Last 3 Encounters:  08/23/16 246 lb 12 oz (111.9 kg)  07/05/16 244 lb 9.6 oz (110.9 kg)  06/29/16 239 lb (108.4 kg)       ASSESSMENT AND PLAN:  Chronic systolic CHF (congestive heart failure) (HCC) - Ejection fraction remains severely depressed, etiology unclear, nonischemic,  He does drink alcohol. Recomended cessation  Myxoid liposarcoma (HCC) Surgery left leg Still with leg pain on the contralateral leg on the right, As well as right arm  History of CVA (cerebrovascular accident) Prior stroke likely secondary to mural thrombus, in the setting of cardiomyopathy He is on aspirin, if he has any recurrent TIA or stroke symptoms were likely need warfarin or eliquis  Congestive dilated cardiomyopathy ([place]) - Plan: Ambulatory referral to Cardiac Electrophysiology Etiology unclear,  nonischemic, he has alcohol, tonic hypertension We will increase his losartan up to 100 mg daily, carvedilol up to 25 mg twice a day It appears he has no insurance, Will recommend low-dose Aldactone on follow-up visit Potentially could petition for company assistance with entresto to replace his losartan in follow-up visit We'll recommend he get established with EP to determine if ICD is indicated. Nonischemic dilated cardiomyopathy, no appreciated arrhythmia events  Essential hypertension Hypertensive on today's visit, Medication changes as above. He does not have a blood pressure cuff  Alcohol abuse Recommended alcohol cessation Discussed complications from alcohol including cardiomyopathy  Leg pain/arm pain Etiology unclear, possibly from the Lipitor. Recommended he hold Lipitor for 2-3 weeks   Total encounter time more than 25 minutes  Greater than 50% was spent in counseling and coordination of care with the patient   Disposition:   F/U  6 months   Orders Placed This Encounter  Procedures  . Ambulatory referral to Cardiac Electrophysiology     Signed, Esmond Plants, M.D., Ph.D. 08/23/2016  [place], [place]

## 2016-08-23 NOTE — Patient Instructions (Addendum)
We will set up an appt with Dr. Caryl Comes to discuss possible need for ICD  Medication Instructions:   Do a trial off the lipitor for a few weeks to see if leg pain gets better  Please increase the losartan up to 100 mg daily After one week, Then increase the coreg/carvedilol up to 25 mg twice a day  Labwork:  No new labs needed  Testing/Procedures:  No further testing at this time   I recommend watching educational videos on topics of interest to you at:       [email or web address]  Enter code: [email or web address]    Follow-Up: It was a pleasure seeing you in the office today. Please call us if you have new issues that need to be addressed before your next appt.  [phone]  Your physician wants you to follow-up in: 2 months.  You will receive a reminder letter in the mail two months in advance. If you don't receive a letter, please call our office to schedule the follow-up appointment.  If you need a refill on your cardiac medications before your next appointment, please call your pharmacy.

## 2016-10-16 ENCOUNTER — Ambulatory Visit: Payer: Self-pay | Admitting: Cardiovascular Disease

## 2016-10-16 NOTE — Progress Notes (Signed)
Cardiology Office Note  Date:  10/17/2016   ID:  Austin Holmes, DOB 12-Mar-1971, MRN [id number]  PCP:  [name] Patient   Chief Complaint  Patient presents with  . other    2 month follow up. Patient states he is doing well.  Meds reviewed verbally with patient.     HPI:  46 year old gentleman with nonischemic dilated cardiomyopathy, ejection fraction 25%, cardiac catheterization May 2017 with no coronary disease,history of chronic alcohol use/beer daily, History of stroke April 2017, left bundle branch block  He presents for routine follow-up of his nonischemic dilated cardiomyopathy   in the hospital October 2017 for left proximal thigh lipomatous tumor, liposarcoma He had radical resection, this measured 7 cm x 9 cm  Echocardiogram 05/18/2016  as well as prior study April 2017 Ejection fraction 20-25%, diffuse hypokinesis,  Previous echocardiogram April 2017 with ejection fraction 20%  Prior carotid ultrasound with no stenosis by report  In follow-up he reports feeling well, denies any leg swelling, ankle edema, abdominal bloating, shortness of breath, PND or orthopnea. Reports he is tolerating his medications without symptoms. Reports he has stopped drinking alcohol Good exertional tolerance  no significant weight gain  Denies any TIA or stroke type symptoms  EKG on today's visit shows normal sinus rhythm with rate 75 bpm left bundle branch block, no significant change from prior EKG   PMH:   has a past medical history of DCM (dilated cardiomyopathy) ([place]); HLD (hyperlipidemia); Myxoid liposarcoma ([place]); and Stroke ([place]) (2017).  PSH:    Past Surgical History:  Procedure Laterality Date  . CARDIAC CATHETERIZATION N/A 11/30/2015   Procedure: Left Heart Cath and Coronary Angiography;  Surgeon: Jettie Booze, MD;  Location: [facility];  Service: Cardiovascular;  Laterality: N/A;  . EXCISION OF LEFT THIGH MASS Left 05/24/2016  . MASS EXCISION Left  05/24/2016   Procedure: EXCISION of left thigh MASS;  Surgeon: Hall Busing, MD;  Location: [facility];  Service: General;  Laterality: Left;  . none    . TEE WITHOUT CARDIOVERSION N/A 11/11/2015   Procedure: TRANSESOPHAGEAL ECHOCARDIOGRAM (TEE);  Surgeon: Herminio Commons, MD;  Location: [facility];  Service: Cardiovascular;  Laterality: N/A;    Current Outpatient Prescriptions  Medication Sig Dispense Refill  . aspirin EC 325 MG EC tablet Take 1 tablet (325 mg total) by mouth daily. 100 tablet 0  . atorvastatin (LIPITOR) 40 MG tablet Take 1 tablet (40 mg total) by mouth daily at 6 PM. 30 tablet 11  . carvedilol (COREG) 25 MG tablet Take 1 tablet (25 mg total) by mouth 2 (two) times daily. 180 tablet 3  . furosemide (LASIX) 40 MG tablet TAKE ONE TABLET BY MOUTH ONCE DAILY 30 tablet 10  . losartan (COZAAR) 100 MG tablet Take 1 tablet (100 mg total) by mouth daily. 90 tablet 3  . topiramate (TOPAMAX) 25 MG tablet Take 1 tablet (25 mg total) by mouth 2 (two) times daily. 60 tablet 3   No current facility-administered medications for this visit.      Allergies:   Patient has no known allergies.   Social History:  The patient  reports that he has never smoked. He has never used smokeless tobacco. He reports that he drinks alcohol. He reports that he does not use drugs.   Family History:   family history includes Cancer in his father; Diabetes Mellitus II in his maternal grandfather and maternal grandmother.    Review of Systems: Review of Systems  Constitutional: Negative.   Respiratory: Negative.   Cardiovascular: Negative.   Gastrointestinal: Negative.   Musculoskeletal: Negative.   Neurological: Negative.   Psychiatric/Behavioral: Negative.   All other systems reviewed and are negative.    PHYSICAL EXAM: VS:  BP 102/70 (BP Location: Left Arm, Patient Position: Sitting, Cuff Size: Normal)   Pulse 75   Ht 6\' 1"  (1.854 m)   Wt 244 lb 8 oz (110.9 kg)   BMI 32.26 kg/m  , BMI  Body mass index is 32.26 kg/m. GEN: Well nourished, well developed, in no acute distress  HEENT: normal  Neck: no JVD, carotid bruits, or masses Cardiac: RRR; no murmurs, rubs, or gallops,no edema  Respiratory:  clear to auscultation bilaterally, normal work of breathing GI: soft, nontender, nondistended, + BS MS: no deformity or atrophy  Skin: warm and dry, no rash Neuro:  Strength and sensation are intact Psych: euthymic mood, full affect    Recent Labs: 05/19/2016: ALT 26 05/25/2016: Hemoglobin 13.2; Platelets 165 06/01/2016: BUN 12.4; Creatinine 1.1; Potassium 4.2; Sodium 140    Lipid Panel Lab Results  Component Value Date   CHOL 202 (H) 11/02/2015   HDL 91 11/02/2015   LDLCALC 104 (H) 11/02/2015   TRIG 37 11/02/2015      Wt Readings from Last 3 Encounters:  10/17/16 244 lb 8 oz (110.9 kg)  08/23/16 246 lb 12 oz (111.9 kg)  07/05/16 244 lb 9.6 oz (110.9 kg)       ASSESSMENT AND PLAN:  Alcoholic cardiomyopathy ([place]) - Plan: EKG 12-Lead Reports he has stopped drinking, feels well Unable to advance his medications given low blood pressure BMP will be drawn today, Order placed Consider repeat echocardiogram in the future now that he has stopped drinking  Mixed hyperlipidemia - Plan: EKG 12-Lead Encouraged him to stay on his Lipitor  Chronic systolic CHF (congestive heart failure) ([place]) - Plan: EKG 12-Lead Appears relatively euvolemic based on clinical exam No changes to his medications Discussed warning signs to watch for Recommended he watch his weight closely  Acute CVA (cerebrovascular accident) ([place]) - Plan: EKG 12-Lead Etiology unclear, possibly mural thrombus in the past He is on high-dose aspirin with no complications  Malignant lipomatous tumor ([place]) - Plan: EKG 12-Lead Recovered well from surgery   Total encounter time more than 25 minutes  Greater than 50% was spent in counseling and coordination of care with the patient   Disposition:    F/U  6 months   Orders Placed This Encounter  Procedures  . EKG 12-Lead     Signed, Esmond Plants, M.D., Ph.D. 10/17/2016  [place], [place]

## 2016-10-17 ENCOUNTER — Ambulatory Visit (INDEPENDENT_AMBULATORY_CARE_PROVIDER_SITE_OTHER): Payer: Self-pay | Admitting: Cardiovascular Disease

## 2016-10-17 ENCOUNTER — Encounter: Payer: Self-pay | Admitting: Cardiovascular Disease

## 2016-10-17 VITALS — BP 102/70 | HR 75 | Ht 73.0 in | Wt 244.5 lb

## 2016-10-17 DIAGNOSIS — C499 Malignant neoplasm of connective and soft tissue, unspecified: Secondary | ICD-10-CM

## 2016-10-17 DIAGNOSIS — I426 Alcoholic cardiomyopathy: Secondary | ICD-10-CM

## 2016-10-17 DIAGNOSIS — I639 Cerebral infarction, unspecified: Secondary | ICD-10-CM

## 2016-10-17 DIAGNOSIS — I5022 Chronic systolic (congestive) heart failure: Secondary | ICD-10-CM

## 2016-10-17 DIAGNOSIS — E782 Mixed hyperlipidemia: Secondary | ICD-10-CM

## 2016-10-17 NOTE — Patient Instructions (Signed)
You look great! Continue the same medications Tell momma everything ok!   Medication Instructions:   No medication changes made  Labwork:  We will check a BMP today  Testing/Procedures:  No further testing at this time   I recommend watching educational videos on topics of interest to you at:       [email or web address]  Enter code: [email or web address]    Follow-Up: It was a pleasure seeing you in the office today. Please call us if you have new issues that need to be addressed before your next appt.  [phone]  Your physician wants you to follow-up in: 6 months.  You will receive a reminder letter in the mail two months in advance. If you don't receive a letter, please call our office to schedule the follow-up appointment.  If you need a refill on your cardiac medications before your next appointment, please call your pharmacy.

## 2016-10-18 LAB — BASIC METABOLIC PANEL
BUN/Creatinine Ratio: 11 (ref 9–20)
BUN: 12 mg/dL (ref 6–24)
CO2: 26 mmol/L (ref 18–29)
Calcium: 9.5 mg/dL (ref 8.7–10.2)
Chloride: 97 mmol/L (ref 96–106)
Creatinine, Ser: 1.14 mg/dL (ref 0.76–1.27)
GFR calc Af Amer: 88 mL/min/{1.73_m2} (ref >59)
GFR calc non Af Amer: 76 mL/min/{1.73_m2} (ref >59)
Glucose: 93 mg/dL (ref 65–99)
Potassium: 4.8 mmol/L (ref 3.5–5.2)
Sodium: 138 mmol/L (ref 134–144)

## 2016-11-02 ENCOUNTER — Ambulatory Visit (HOSPITAL_COMMUNITY): Admission: RE | Admit: 2016-11-02 | Payer: Self-pay | Source: Ambulatory Visit

## 2016-11-07 ENCOUNTER — Ambulatory Visit: Payer: Managed Care, Other (non HMO) | Admitting: General Surgery

## 2016-11-08 ENCOUNTER — Telehealth: Payer: Self-pay | Admitting: *Deleted

## 2016-11-08 NOTE — Telephone Encounter (Signed)
I have called and left the patient a message to the office back. Patient FTKA on 4/10

## 2016-11-09 ENCOUNTER — Telehealth: Payer: Self-pay | Admitting: *Deleted

## 2016-11-09 NOTE — Telephone Encounter (Signed)
I have called and spoke the patient's mother regarding appts. Appt rescheduled for April 24th. Per patient's mother they will reschedule the MRI scan to be done before visit

## 2016-11-18 ENCOUNTER — Ambulatory Visit (HOSPITAL_COMMUNITY)
Admission: RE | Admit: 2016-11-18 | Discharge: 2016-11-18 | Disposition: A | Discharge status: Home / Self Care | Payer: Self-pay | Source: Ambulatory Visit | Attending: Gynecologic Oncology | Admitting: Gynecologic Oncology

## 2016-11-18 DIAGNOSIS — C4922 Malignant neoplasm of connective and soft tissue of left lower limb, including hip: Secondary | ICD-10-CM | POA: Insufficient documentation

## 2016-11-18 DIAGNOSIS — C499 Malignant neoplasm of connective and soft tissue, unspecified: Secondary | ICD-10-CM

## 2016-11-18 MED ORDER — GADOBENATE DIMEGLUMINE 529 MG/ML IV SOLN
20.0000 mL | Freq: Once | INTRAVENOUS | Status: AC | PRN
  Administered 2016-11-18: 20 mL via INTRAVENOUS

## 2016-11-21 ENCOUNTER — Ambulatory Visit (HOSPITAL_BASED_OUTPATIENT_CLINIC_OR_DEPARTMENT_OTHER): Payer: Self-pay | Admitting: General Surgery

## 2016-11-21 ENCOUNTER — Encounter: Payer: Self-pay | Admitting: General Surgery

## 2016-11-21 VITALS — BP 129/88 | HR 77 | Temp 98.6°F | Resp 18 | Wt 244.0 lb

## 2016-11-21 DIAGNOSIS — C499 Malignant neoplasm of connective and soft tissue, unspecified: Secondary | ICD-10-CM

## 2016-11-21 DIAGNOSIS — M79604 Pain in right leg: Secondary | ICD-10-CM

## 2016-11-21 NOTE — Progress Notes (Signed)
Clinic note: The patient is here in routine follow-up status post resection of a left proximal thigh lipomatous lesion. He reports that he's been having soreness and pain in his right anterior quadriceps over the last 3 months that is constant, nonradiating, made worse by movement and slight decrease when elevating the leg. He denies any changes in his gait. He denies any buckling or other neurologic symptoms of the right lower extremity. He rates the pain as a 5 out of 10. He is not currently taking any medicine for this. Physical examination: Focused examination of bilateral lower extremities reveals a left sided well-healed proximal medial thigh incision with no palpable masses or evidence of local recurrence. He has 5 over 5 strength in bilateral lower extremities throughout. I am unable to appreciate any masses, weakness or tenderness throughout his right lower extremity including more extensive palpation in the area he pointed out of his right quadriceps. I do not see any discrepancy in the size of his proximal bilateral lower extremities. He has no calf tenderness bilaterally. And there is a negative Homans sign bilaterally. 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 Subsequent encounter. EXAM: MR OF THE LEFT LOWER EXTREMITY WITHOUT AND WITH CONTRAST TECHNIQUE: Multiplanar, multisequence MR imaging of the left thigh was performed both before and after administration of intravenous contrast. CONTRAST:  20 ml MULTIHANCE GADOBENATE DIMEGLUMINE 529 MG/ML IV SOLN COMPARISON:  MRI left thigh 05/16/2016. FINDINGS: Bones/Joint/Cartilage The coronal images incidentally include the right lower leg. Bone marrow signal is normal throughout. Ligaments Intact. Muscles and Tendons Large mass in the proximal right thigh has been resected. No residual tumor is identified. No fluid collection is seen. Soft tissues Mild linear signal abnormality and  enhancement in subcutaneous fatty tissues of the anterior, medial left upper leg noted. IMPRESSION: Negative for residual recurrent tumor. No acute abnormality. This examination can serve as a baseline for follow-up imaging. Electronically Signed   By: Inge Rise M.D.   On: 11/18/2016 11:58 Impression: Patient status post resection with no evidence of recurrence on physical examination or by imaging. He now complains of right quadricep pain without physical findings. Plan: We will order a right femur MRI scan with and without contrast. If that is negative we will move forward with every 6 month follow-up with left femur MRI scan and physical examination. This was explained to the patient and his mother who is accompanying him. They understand and agree with the plan as outlined above.

## 2016-12-01 ENCOUNTER — Other Ambulatory Visit: Payer: Self-pay | Admitting: Gynecologic Oncology

## 2016-12-01 DIAGNOSIS — M79651 Pain in right thigh: Secondary | ICD-10-CM

## 2016-12-04 ENCOUNTER — Telehealth: Payer: Self-pay | Admitting: *Deleted

## 2016-12-04 NOTE — Telephone Encounter (Signed)
I have called and left the patient a message with the dates and times of his MRI in May and October.

## 2016-12-16 ENCOUNTER — Ambulatory Visit (HOSPITAL_COMMUNITY): Payer: Self-pay

## 2016-12-16 ENCOUNTER — Ambulatory Visit (HOSPITAL_COMMUNITY): Admission: RE | Admit: 2016-12-16 | Payer: Self-pay | Source: Ambulatory Visit

## 2016-12-28 ENCOUNTER — Other Ambulatory Visit: Payer: Self-pay

## 2016-12-28 MED ORDER — FUROSEMIDE 40 MG PO TABS: 40.0000 mg | ORAL_TABLET | Freq: Every day | ORAL | 3 refills | Status: DC

## 2017-03-08 ENCOUNTER — Telehealth: Payer: Self-pay | Admitting: *Deleted

## 2017-03-08 NOTE — Telephone Encounter (Signed)
Attempted to contact the patient regarding his scan for Dr. Rolanda Jay. Unable to leave message, phone number rang then disconnected.

## 2017-03-13 ENCOUNTER — Telehealth: Payer: Self-pay | Admitting: *Deleted

## 2017-03-13 NOTE — Telephone Encounter (Signed)
Attempted to contact the patient to cancel the scan for 10/29. Unable to leave message on machine, phone rang and then disconnected. Scan canceled

## 2017-04-30 ENCOUNTER — Other Ambulatory Visit: Payer: Self-pay | Admitting: Cardiovascular Disease

## 2017-05-28 ENCOUNTER — Ambulatory Visit: Payer: Self-pay

## 2017-08-03 ENCOUNTER — Telehealth: Payer: Self-pay | Admitting: Cardiovascular Disease

## 2017-08-03 MED ORDER — LOSARTAN POTASSIUM 100 MG PO TABS: 100.0000 mg | ORAL_TABLET | Freq: Every day | ORAL | 0 refills | Status: DC

## 2017-08-03 NOTE — Telephone Encounter (Signed)
S/w patient's mother, ok per DPR. She stated patient needed a refill for Losartan. Patient last saw Dr Rockey Situ 09/2016 and was supposed to f/u in 6 months. Advised mother that patient needs f/u appointment and I can send in refill for 30 days to get him to that appointment. She was agreeable. Patient scheduled to see Dr Rockey Situ on 08/27/17. Rx sent to [facility].

## 2017-08-03 NOTE — Telephone Encounter (Signed)
Pt states he has heard his Losartan has been discontinued, and asks if Dr. Rockey Situ wants to give him a different rx. Please call.

## 2017-08-09 ENCOUNTER — Telehealth: Payer: Self-pay | Admitting: Cardiovascular Disease

## 2017-08-09 ENCOUNTER — Other Ambulatory Visit: Payer: Self-pay

## 2017-08-09 MED ORDER — LOSARTAN POTASSIUM 100 MG PO TABS: 100.0000 mg | ORAL_TABLET | Freq: Every day | ORAL | 0 refills | Status: DC

## 2017-08-09 MED ORDER — CARVEDILOL 25 MG PO TABS: 25.0000 mg | ORAL_TABLET | Freq: Two times a day (BID) | ORAL | 3 refills | Status: DC

## 2017-08-09 NOTE — Telephone Encounter (Signed)
°*  STAT* If patient is at the pharmacy, call can be transferred to refill team.   1. Which medications need to be refilled? (please list name of each medication and dose if known)      Losartan 100 mg po q day   2. Which pharmacy/location (including street and city if local pharmacy) is medication to be sent to?     [facility]   3. Do they need a 30 day or 90 day supply? 30   PATIENT IS WORRIED THIS WONT BE CALLED IN TODAY AND HE IS OUT OF MEDS   Please call when sent

## 2017-08-09 NOTE — Telephone Encounter (Signed)
Requested Prescriptions   Signed Prescriptions Disp Refills  . losartan (COZAAR) 100 MG tablet 30 tablet 0    Sig: Take 1 tablet (100 mg total) by mouth daily. Please keep appt on 08/27/17 at 8:40 am for further refills.    Authorizing Provider: Minna Merritts    Ordering User: Janan Ridge

## 2017-08-25 NOTE — Progress Notes (Signed)
Cardiology Office Note  Date:  08/27/2017   ID:  Austin Holmes, DOB 1971-04-11, MRN [id number]  PCP:  Patient, [name]   Chief Complaint  Patient presents with  . other    6 month follow up. Meds reviewed by the pt. verbally. "doing well."    HPI:  47 year old gentleman with nonischemic dilated cardiomyopathy,  ejection fraction 25%, in 2017 cardiac catheterization May 2017 with no coronary disease, history of chronic alcohol use/beer daily, much less now History of stroke April 2017,right side  left bundle branch block  He presents for routine follow-up of his nonischemic dilated cardiomyopathy   in the hospital October 2017 for left proximal thigh lipomatous tumor, liposarcoma He had radical resection, this measured 7 cm x 9 cm  Echocardiogram 05/18/2016  as well as prior study April 2017 Ejection fraction 20-25%, diffuse hypokinesis,  Previous echocardiogram April 2017 with ejection fraction 20%  Prior carotid ultrasound with no stenosis by report  Reports doing well on today's visit Reports that he stopped drinking malt liquor over a year ago Now just drinks Bud Light 3 days/week, 3 beers all week Denies any abdominal bloating, leg swelling, shortness of breath When monitored  In follow-up he reports feeling well, denies any leg swelling, ankle edema, abdominal bloating, shortness of breath, PND or orthopnea. Reports he is tolerating his medications without symptoms. 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  PSH:     Past Surgical History:  Procedure Laterality Date  . CARDIAC CATHETERIZATION N/A 11/30/2015   Procedure: Left Heart Cath and Coronary Angiography;  Surgeon: Jettie Booze, MD;  Location: [facility];  Service: Cardiovascular;  Laterality: N/A;  . EXCISION OF LEFT THIGH MASS Left 05/24/2016  . MASS EXCISION Left 05/24/2016   Procedure: EXCISION of left thigh MASS;  Surgeon: Hall Busing, MD;  Location: [facility];  Service: General;  Laterality: Left;  . none    . TEE WITHOUT CARDIOVERSION N/A 11/11/2015   Procedure: TRANSESOPHAGEAL ECHOCARDIOGRAM (TEE);  Surgeon: Herminio Commons, MD;  [facility];  Service: Cardiovascular;  Laterality: N/A;    Current Outpatient Medications  Medication Sig Dispense Refill  . aspirin EC 325 MG EC tablet Take 1 tablet (325 mg total) by mouth daily. 100 tablet 0  . atorvastatin (LIPITOR) 40 MG tablet Take 1 tablet (40 mg total) by mouth daily at 6 PM. 30 tablet 11  . carvedilol (COREG) 25 MG tablet Take 1 tablet (25 mg total) by mouth 2 (two) times daily. 180 tablet 3  . furosemide (LASIX) 40 MG tablet TAKE 1 TABLET BY MOUTH ONCE DAILY 90 tablet 3  . losartan (COZAAR) 100 MG tablet Take 1 tablet (100 mg total) by mouth daily. Please keep appt on 08/27/17 at 8:40 am for further refills. 30 tablet 0  . topiramate (TOPAMAX) 25 MG tablet Take 1 tablet (25 mg total) by mouth 2 (two) times daily. 60 tablet 3   No current facility-administered medications for this visit.      Allergies:   Patient has no known allergies.  Social History:  The patient  reports that  has never smoked. he has never used smokeless tobacco. He reports that he drinks alcohol. He reports that he does not use drugs.   Family History:   family history includes Cancer in his father; Diabetes Mellitus II in his maternal grandfather and maternal grandmother.    Review of Systems: Review of Systems  Constitutional: Negative.   Respiratory: Negative.    Cardiovascular: Negative.   Gastrointestinal: Negative.   Musculoskeletal: Negative.   Neurological: Negative.   Psychiatric/Behavioral: Negative.   All other systems reviewed and are negative.    PHYSICAL EXAM: VS:  BP 130/70 (BP Location: Left Arm, Patient Position: Sitting, Cuff Size: Large)   Pulse 73   Ht 6\' 1"  (1.854 m)   Wt 250 lb (113.4 kg)   BMI 32.98 kg/m  , BMI Body mass index is 32.98 kg/m. GEN: Well nourished, well developed, in no acute distress  HEENT: normal  Neck: no JVD, carotid bruits, or masses Cardiac: RRR; no murmurs, rubs, or gallops,no edema  Respiratory:  clear to auscultation bilaterally, normal work of breathing GI: soft, nontender, nondistended, + BS MS: no deformity or atrophy  Skin: warm and dry, no rash Neuro:  Strength and sensation are intact Psych: euthymic mood, full affect    Recent Labs: 10/17/2016: BUN 12; Creatinine, Ser 1.14; Potassium 4.8; Sodium 138    Lipid Panel Lab Results  Component Value Date   CHOL 202 (H) 11/02/2015   HDL 91 11/02/2015   LDLCALC 104 (H) 11/02/2015   TRIG 37 11/02/2015      Wt Readings from Last 3 Encounters:  08/27/17 250 lb (113.4 kg)  11/21/16 244 lb (110.7 kg)  10/17/16 244 lb 8 oz (110.9 kg)       ASSESSMENT AND PLAN:  Alcoholic cardiomyopathy ([place]) - Plan: EKG 12-Lead Reports he has stopped drinking, feels well Repeat echocardiogram ordered BMP will be drawn today, Order placed Stressed importance of alcohol cessation  Mixed hyperlipidemia - Plan: EKG 12-Lead Encouraged him to stay on his Lipitor Medication has been renewed  Chronic systolic CHF (congestive heart failure) ([place]) - Plan: EKG 12-Lead Weight trending up, likely overeating No signs of fluid overload Continue current medications, repeat echocardiogram now that he has stopped drinking  Acute CVA (cerebrovascular accident) ([place]) - Plan: EKG 12-Lead Etiology unclear, possibly mural thrombus in the past Recommend he  consider decreasing aspirin dosing  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 EKG 12-Lead     Signed, Esmond Plants, M.D., Ph.D. 08/27/2017  [place], [place]

## 2017-08-27 ENCOUNTER — Encounter: Payer: Self-pay | Admitting: Cardiovascular Disease

## 2017-08-27 ENCOUNTER — Ambulatory Visit (INDEPENDENT_AMBULATORY_CARE_PROVIDER_SITE_OTHER): Payer: Self-pay | Admitting: Cardiovascular Disease

## 2017-08-27 VITALS — BP 130/70 | HR 73 | Ht 73.0 in | Wt 250.0 lb

## 2017-08-27 DIAGNOSIS — E785 Hyperlipidemia, unspecified: Secondary | ICD-10-CM

## 2017-08-27 DIAGNOSIS — I5022 Chronic systolic (congestive) heart failure: Secondary | ICD-10-CM

## 2017-08-27 DIAGNOSIS — Z8673 Personal history of transient ischemic attack (TIA), and cerebral infarction without residual deficits: Secondary | ICD-10-CM

## 2017-08-27 DIAGNOSIS — E782 Mixed hyperlipidemia: Secondary | ICD-10-CM

## 2017-08-27 DIAGNOSIS — F1010 Alcohol abuse, uncomplicated: Secondary | ICD-10-CM

## 2017-08-27 DIAGNOSIS — I10 Essential (primary) hypertension: Secondary | ICD-10-CM

## 2017-08-27 DIAGNOSIS — I426 Alcoholic cardiomyopathy: Secondary | ICD-10-CM

## 2017-08-27 DIAGNOSIS — I639 Cerebral infarction, unspecified: Secondary | ICD-10-CM

## 2017-08-27 MED ORDER — LOSARTAN POTASSIUM 100 MG PO TABS: 100.0000 mg | ORAL_TABLET | Freq: Every day | ORAL | 3 refills | Status: DC

## 2017-08-27 MED ORDER — CARVEDILOL 25 MG PO TABS: 25.0000 mg | ORAL_TABLET | Freq: Two times a day (BID) | ORAL | 3 refills | Status: DC

## 2017-08-27 MED ORDER — SILDENAFIL CITRATE 20 MG PO TABS: 20.0000 mg | ORAL_TABLET | Freq: Three times a day (TID) | ORAL | 1 refills | Status: DC | PRN

## 2017-08-27 MED ORDER — ATORVASTATIN CALCIUM 40 MG PO TABS: 40.0000 mg | ORAL_TABLET | Freq: Every day | ORAL | 3 refills | Status: DC

## 2017-08-27 NOTE — Patient Instructions (Addendum)
Medication Instructions:   Try revatio/sildenafil 3 to 5 pills at one time on the night  Labwork:  BMP today  Testing/Procedures:  We will order an echocardiogram for dilated cardiomyopathy   Follow-Up: It was a pleasure seeing you in the office today. Please call us if you have new issues that need to be addressed before your next appt.  [phone]  Your physician wants you to follow-up in: 12 months.  You will receive a reminder letter in the mail two months in advance. If you don't receive a letter, please call our office to schedule the follow-up appointment.  If you need a refill on your cardiac medications before your next appointment, please call your pharmacy.

## 2017-08-28 LAB — BASIC METABOLIC PANEL
BUN/Creatinine Ratio: 12 (ref 9–20)
BUN: 14 mg/dL (ref 6–24)
CO2: 25 mmol/L (ref 20–29)
Calcium: 9.6 mg/dL (ref 8.7–10.2)
Chloride: 101 mmol/L (ref 96–106)
Creatinine, Ser: 1.18 mg/dL (ref 0.76–1.27)
GFR calc Af Amer: 84 mL/min/{1.73_m2} (ref >59)
GFR calc non Af Amer: 73 mL/min/{1.73_m2} (ref >59)
Glucose: 99 mg/dL (ref 65–99)
Potassium: 4.3 mmol/L (ref 3.5–5.2)
Sodium: 140 mmol/L (ref 134–144)

## 2017-08-29 ENCOUNTER — Other Ambulatory Visit: Payer: Self-pay | Admitting: Cardiovascular Disease

## 2017-08-29 DIAGNOSIS — I426 Alcoholic cardiomyopathy: Secondary | ICD-10-CM

## 2017-08-29 DIAGNOSIS — Z8673 Personal history of transient ischemic attack (TIA), and cerebral infarction without residual deficits: Secondary | ICD-10-CM

## 2017-08-29 DIAGNOSIS — I5022 Chronic systolic (congestive) heart failure: Secondary | ICD-10-CM

## 2017-09-06 ENCOUNTER — Ambulatory Visit (INDEPENDENT_AMBULATORY_CARE_PROVIDER_SITE_OTHER): Payer: Self-pay

## 2017-09-06 ENCOUNTER — Other Ambulatory Visit: Payer: Self-pay

## 2017-09-06 DIAGNOSIS — I426 Alcoholic cardiomyopathy: Secondary | ICD-10-CM

## 2017-09-06 DIAGNOSIS — Z8673 Personal history of transient ischemic attack (TIA), and cerebral infarction without residual deficits: Secondary | ICD-10-CM

## 2017-09-06 DIAGNOSIS — I5022 Chronic systolic (congestive) heart failure: Secondary | ICD-10-CM

## 2017-09-10 ENCOUNTER — Telehealth: Payer: Self-pay | Admitting: *Deleted

## 2017-09-10 MED ORDER — SACUBITRIL-VALSARTAN 97-103 MG PO TABS: 1.0000 | ORAL_TABLET | Freq: Two times a day (BID) | ORAL | 6 refills | Status: DC

## 2017-09-10 NOTE — Telephone Encounter (Signed)
Spoke with patient and reviewed Dr. Donivan Scull recommendations to discontinue losartan and start Entresto 97/103 mg twice daily. Patient states that he does not have insurance and is not sure if they can afford this change in medication. They just picked up new prescription of the losartan and recommended that he stay on that until we see what the cost will be. Advised that I would make Dr. Rockey Situ aware and for them to call back if there are any problems getting medication. Patient and mother verbalized understanding with no further questions at this time.

## 2017-09-10 NOTE — Telephone Encounter (Signed)
-----   Message from Minna Merritts, MD sent at 09/07/2017 11:16 AM EST ----- Echo showing severely depressed EF , no change from previous study In effort to get heart function higher, Would recommend we stop nlosartan and start entresto 97/103 mg twice day BMP in one month after starting

## 2017-09-13 NOTE — Telephone Encounter (Signed)
Can we see if he will qualify for company assistance or [employer]

## 2017-09-13 NOTE — Telephone Encounter (Signed)
Spoke with patient and he reports he has application from [employer] that he is working on. He was not able to pick up prescription and had just filled previous one. So he is going to stay on current medications until he can get application for assistance done. He was appreciative for the call with no further questions.

## 2017-09-13 NOTE — Telephone Encounter (Signed)
Left voicemail message to call back  

## 2017-09-25 IMAGING — CT CT ABD-PELV W/ CM
1 of 3 series · 14 of 31 positions shown, 18 images · non-contrast
Comparison: None.

CLINICAL DATA: Myxoid liposarcoma of the left thigh

EXAM:
CT CHEST, ABDOMEN AND PELVIS WITHOUT CONTRAST
TECHNIQUE: Multidetector CT imaging of the chest, abdomen and pelvis was
performed following the standard protocol without IV contrast.

[Series 2: cap with st · axial · 0.78mm/px · z∈[-628,-72]mm · 14 of 133 slices shown, 18 images]
[im 11/133  mediastinal]
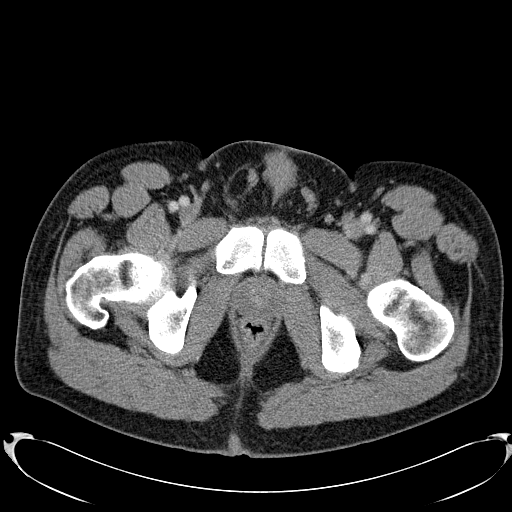
[im 11/133  lung]
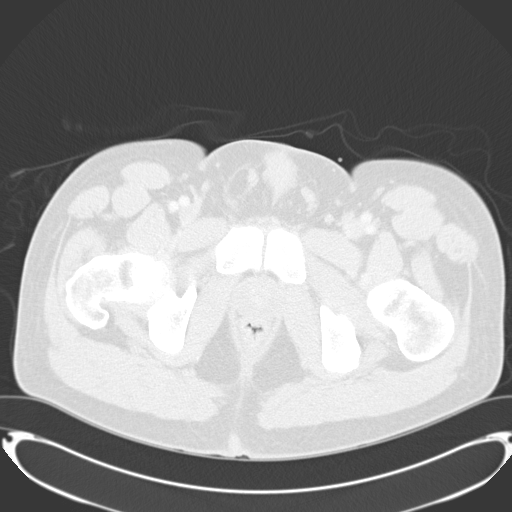
[im 21/133  lung]
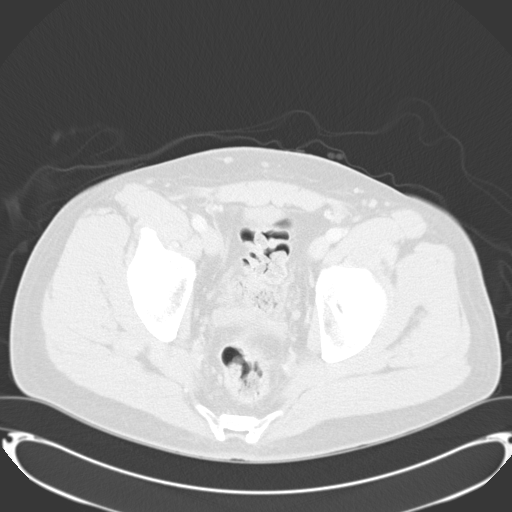
[im 31/133  lung]
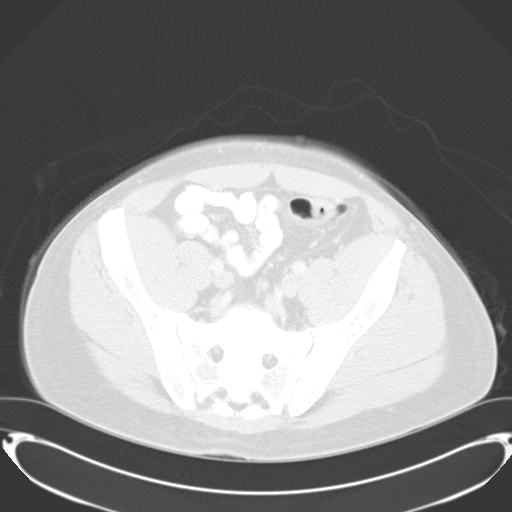
[im 41/133  lung]
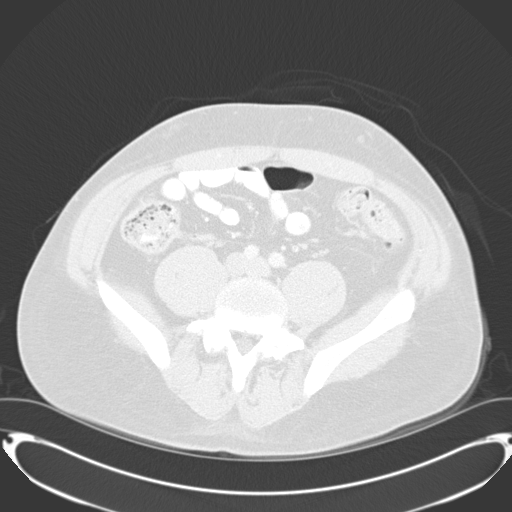
[im 51/133  mediastinal]
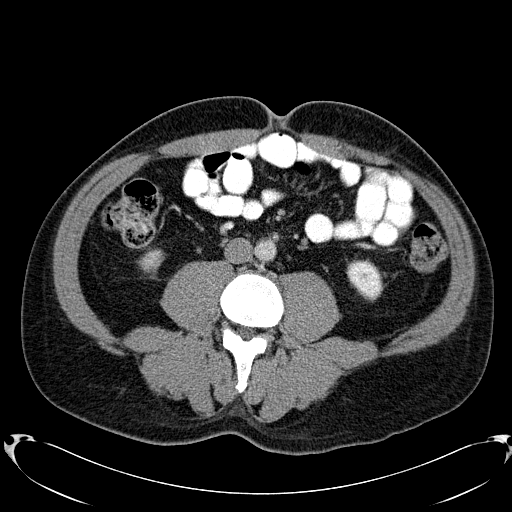
[im 51/133  lung]
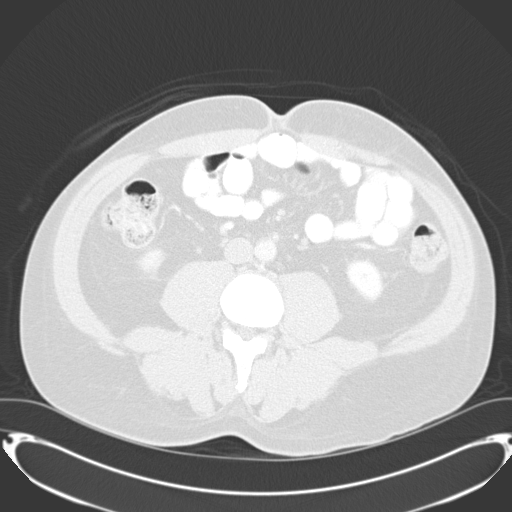
[im 61/133  lung]
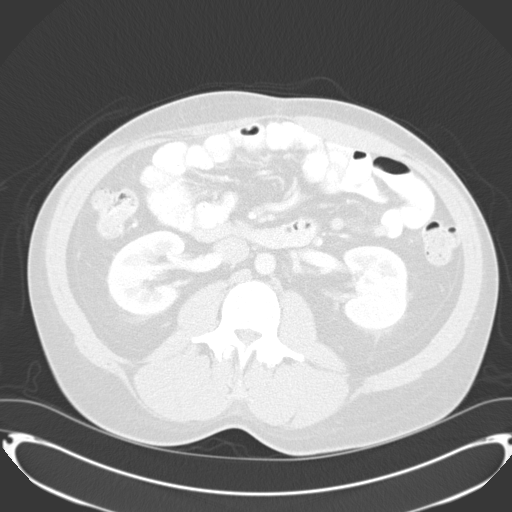
[im 66/133  lung]
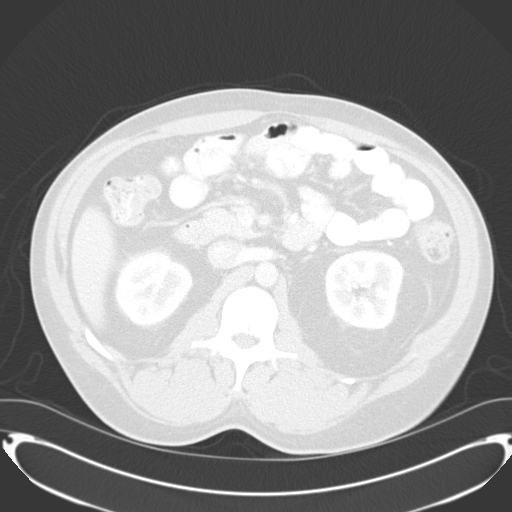
[im 67/133  lung]
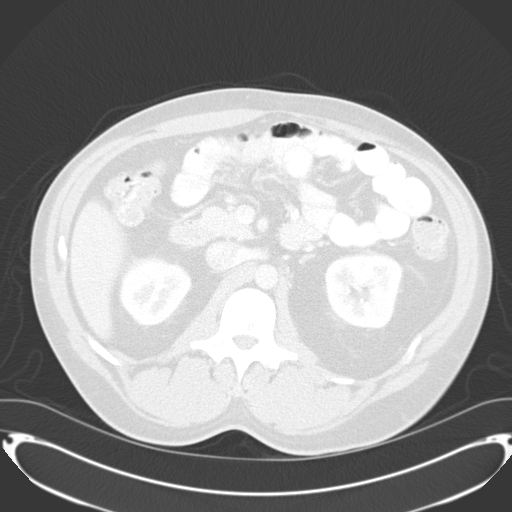
[im 72/133  mediastinal]
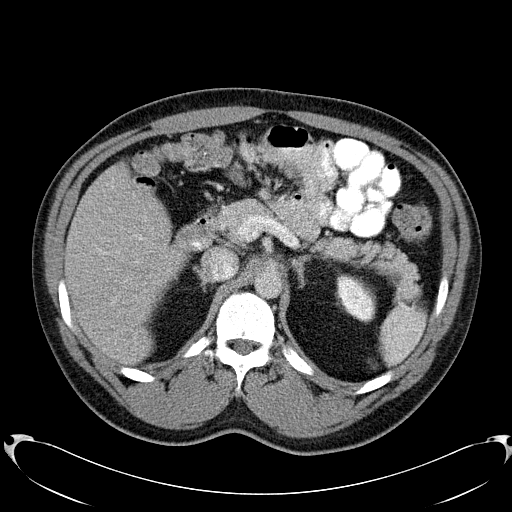
[im 72/133  lung]
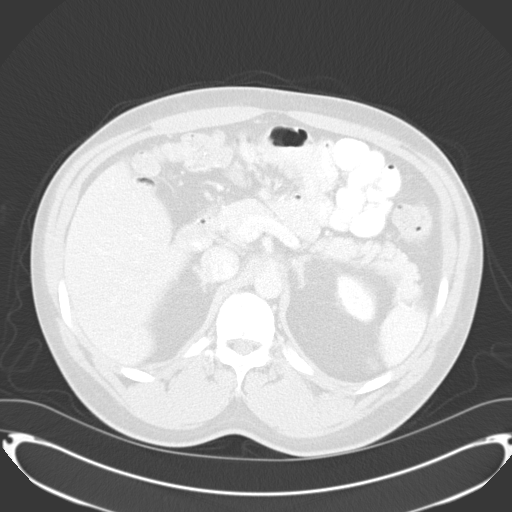
[im 82/133  lung]
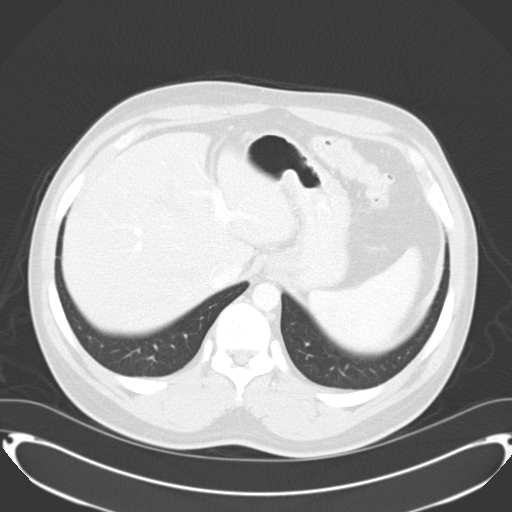
[im 92/133  lung]
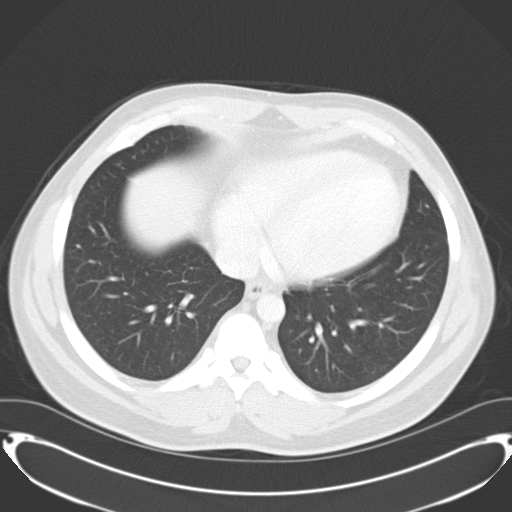
[im 102/133  lung]
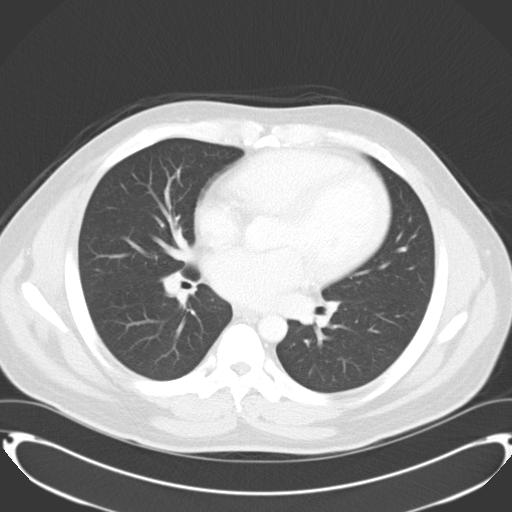
[im 112/133  mediastinal]
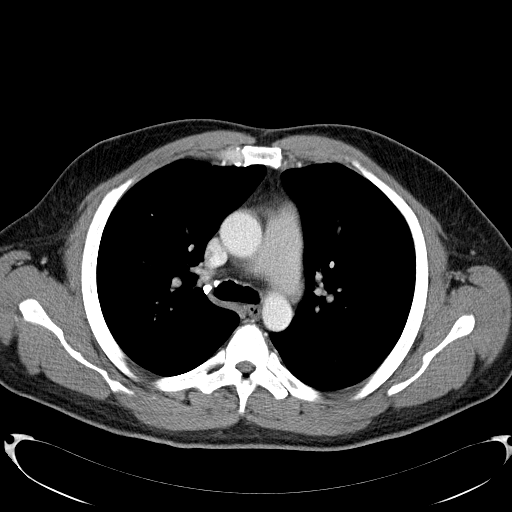
[im 112/133  lung]
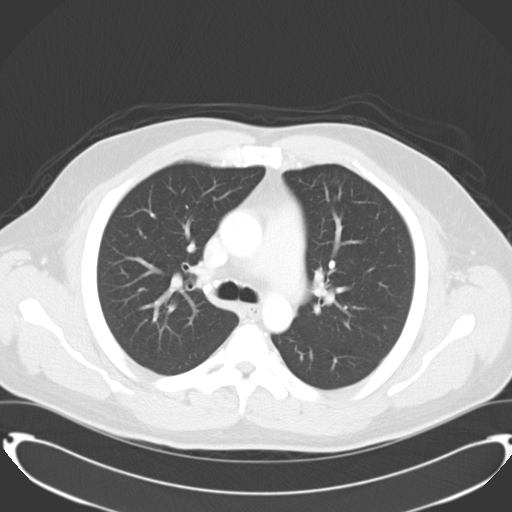
[im 122/133  lung]
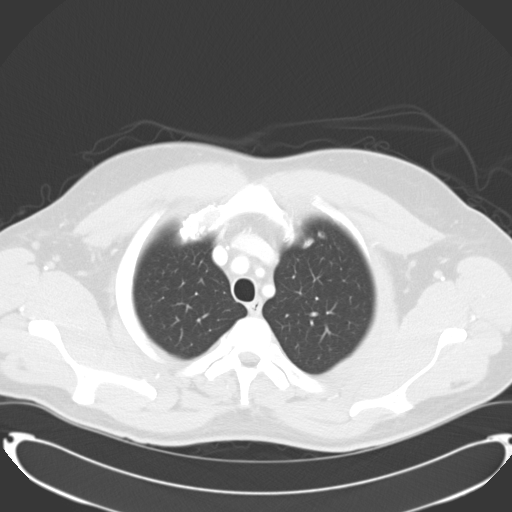

[14 of 31 positions shown; findings below may reference images not displayed]

FINDINGS: CT CHEST FINDINGS

Mediastinum/Lymph Nodes: There is no axillary lymphadenopathy. No
mediastinal lymphadenopathy. There is no hilar lymphadenopathy.
Cardiopericardial silhouette is at upper limits of normal for size.
No pericardial effusion. The esophagus has normal imaging features.

Lungs/Pleura: 3 mm left upper lobe nodule is seen on image 35. No
other pulmonary nodule or mass is evident. No focal airspace
consolidation. No pulmonary edema or pleural effusion.

Musculoskeletal: Bone windows reveal no worrisome lytic or sclerotic
osseous lesions.

CT ABDOMEN PELVIS FINDINGS

Hepatobiliary: No focal abnormality within the liver parenchyma.
There is no evidence for gallstones, gallbladder wall thickening, or
pericholecystic fluid. No intrahepatic or extrahepatic biliary
dilation.

Pancreas: No focal mass lesion. No dilatation of the main duct. No
intraparenchymal cyst. No peripancreatic edema.

Spleen: No splenomegaly. No focal mass lesion.

Adrenals/Urinary Tract: No adrenal nodule or mass. 16 mm cyst
identified upper pole right kidney. Left kidney unremarkable. No
evidence for hydroureter. The urinary bladder appears normal for the
degree of distention.

Stomach/Bowel: Stomach is nondistended. No gastric wall thickening.
No evidence of outlet obstruction. Duodenum is normally positioned
as is the ligament of Treitz. No small bowel wall thickening. No
small bowel dilatation. The terminal ileum is normal. The appendix
is normal. No gross colonic mass. No colonic wall thickening. No
substantial diverticular change.

Vascular/Lymphatic: No abdominal aortic aneurysm. No abdominal
aortic atherosclerotic calcification. There is no gastrohepatic or
hepatoduodenal ligament lymphadenopathy. No intraperitoneal or
retroperitoneal lymphadenopathy. No pelvic sidewall lymphadenopathy.

Reproductive: The prostate gland and seminal vesicles have normal
imaging features.

Other: No intraperitoneal free fluid.

Musculoskeletal: Bone windows reveal no worrisome lytic or sclerotic
osseous lesions.
IMPRESSION: 1. 3 mm left upper lobe pulmonary nodule. This is unlikely to
represent metastatic disease, but close attention on follow-up
imaging recommended.
2. Otherwise no evidence for metastatic disease in the chest,
abdomen, or pelvis.

## 2017-09-26 ENCOUNTER — Telehealth: Payer: Self-pay | Admitting: *Deleted

## 2017-09-26 NOTE — Telephone Encounter (Signed)
Per request from [facility] faxed the medical records to [phone].

## 2018-05-08 ENCOUNTER — Other Ambulatory Visit: Payer: Self-pay | Admitting: Cardiovascular Disease

## 2018-05-10 ENCOUNTER — Telehealth: Payer: Self-pay | Admitting: Cardiovascular Disease

## 2018-05-10 ENCOUNTER — Telehealth: Payer: Self-pay | Admitting: *Deleted

## 2018-05-10 NOTE — Telephone Encounter (Signed)
Pt c/o BP issue: STAT if pt c/o blurred vision, one-sided weakness or slurred speech  1. What are your last 5 BP readings?  100/60 when paramedic took  Doesn't have any more readings.  2. Are you having any other symptoms (ex. Dizziness, headache, blurred vision, passed out)? Dizziness and shaking, pt mother called parmadic and they advised she reach out to Dr. Rockey Situ  3. What is your BP issue? low

## 2018-05-10 NOTE — Telephone Encounter (Signed)
Spoke with patient and he states that he feels fine now and that blood pressure was normal. Reviewed importance of not getting too dehydrated and to continue monitoring blood pressures. Instructed him to please give [place] a call back if his symptoms persist or worsen. He verbalized understanding with no further questions at this time.

## 2018-05-10 NOTE — Telephone Encounter (Signed)
C from pt's mother, [name].  She states that pt had a dizzy spell this morning. No falls or injuries.  She had called 911. Paramedics came and assessed pt. Per pt's mother, pt's BP was low @ 100/60.  She states that the paramedics advised her and patient to have his BP meds and diuretics re-evaluated.  Pt remained at home. Pt's mother was not sure who ordered these medications. Reviewed pt's meds and Dr. Ida Rogue is the ordering MD. Provided his office # to pt's mother and advised her to call there to discuss his BP meds and diuretic.   She voiced understanding.  No further questions.

## 2018-06-10 ENCOUNTER — Other Ambulatory Visit: Payer: Self-pay | Admitting: Cardiovascular Disease

## 2018-06-10 NOTE — Telephone Encounter (Signed)
Please review the Losartan refill. The Losartan is no longer on the patient's med list but Dr. Donivan Scull last office visit the Losartan 100 mg was on the list of medications to continue. Please advise if the patient is to continue Losartan 100 mg and if so, please add to medication list and refill.

## 2018-08-07 ENCOUNTER — Other Ambulatory Visit: Payer: Self-pay | Admitting: Cardiovascular Disease

## 2018-08-09 ENCOUNTER — Other Ambulatory Visit: Payer: Self-pay | Admitting: Cardiovascular Disease

## 2018-09-02 ENCOUNTER — Other Ambulatory Visit: Payer: Self-pay | Admitting: Cardiovascular Disease

## 2018-09-02 NOTE — Telephone Encounter (Signed)
°*  STAT* If patient is at the pharmacy, call can be transferred to refill team.   1. Which medications need to be refilled? (please list name of each medication and dose if known)    Asa 325 mg po q d    Atorvastatin 40 po q d    Carvedilol 25 mg po BID   Furosemide 40 mg p q d    Losartan 100 mg po q d    Potassium 20 mEq po q d      2. Which pharmacy/location (including street and city if local pharmacy) is medication to be sent to? [facility] [address]  3. Do they need a 30 day or 90 day supply? [place]

## 2018-09-02 NOTE — Telephone Encounter (Signed)
Please advise if ok to refill: Aspirin 325 mg tablet qd. Potassium 20 meq qd.  Potassium is not on pt's current medication list last seen 07/2017.

## 2018-09-03 NOTE — Telephone Encounter (Signed)
After trying to dial number a few times, got message that "call cannot be completed as dialed at this time, please try your call again later."   Patient is scheduled to see Sharolyn Douglas, NP on 09/26/18.   Need to clarify what patient is taking.

## 2018-09-05 ENCOUNTER — Other Ambulatory Visit: Payer: Self-pay | Admitting: Cardiovascular Disease

## 2018-09-05 NOTE — Telephone Encounter (Signed)
Please call to discuss medications.

## 2018-09-05 NOTE — Telephone Encounter (Signed)
No answer. Left message to call back.   

## 2018-09-09 NOTE — Telephone Encounter (Signed)
Attempted to call the patient. Message stated "your call cannot be completed as dialed."

## 2018-09-16 NOTE — Telephone Encounter (Signed)
Spoke with patients mother per release form to review upcoming appointment and medications. She states that he picked up some medications last week and has enough now until his appointment. Confirmed date, time, and location with her and advised that we can then review all medications and determine if changes need to be made. She verbalized understanding with no further questions at this time. Confirmed patients phone number because the one we have on file is no longer in service. She provided me with updated number and had no further questions at this time.

## 2018-09-16 NOTE — Telephone Encounter (Signed)
Spoke with patients mother per release form and she requested that I please call her back in roughly 20 minutes because she is currently driving.

## 2018-09-26 ENCOUNTER — Telehealth: Payer: Self-pay | Admitting: Cardiovascular Disease

## 2018-09-26 ENCOUNTER — Encounter: Payer: Self-pay | Admitting: Nurse Practitioner

## 2018-09-26 ENCOUNTER — Ambulatory Visit (INDEPENDENT_AMBULATORY_CARE_PROVIDER_SITE_OTHER): Payer: Self-pay | Admitting: Nurse Practitioner

## 2018-09-26 VITALS — BP 110/60 | HR 73 | Ht 73.0 in | Wt 250.8 lb

## 2018-09-26 DIAGNOSIS — Z7289 Other problems related to lifestyle: Secondary | ICD-10-CM

## 2018-09-26 DIAGNOSIS — Z789 Other specified health status: Secondary | ICD-10-CM

## 2018-09-26 DIAGNOSIS — Z79899 Other long term (current) drug therapy: Secondary | ICD-10-CM

## 2018-09-26 DIAGNOSIS — I5022 Chronic systolic (congestive) heart failure: Secondary | ICD-10-CM

## 2018-09-26 DIAGNOSIS — I10 Essential (primary) hypertension: Secondary | ICD-10-CM

## 2018-09-26 DIAGNOSIS — E785 Hyperlipidemia, unspecified: Secondary | ICD-10-CM

## 2018-09-26 DIAGNOSIS — I428 Other cardiomyopathies: Secondary | ICD-10-CM

## 2018-09-26 MED ORDER — CARVEDILOL 25 MG PO TABS: 25.0000 mg | ORAL_TABLET | Freq: Two times a day (BID) | ORAL | 2 refills | Status: DC

## 2018-09-26 MED ORDER — FUROSEMIDE 40 MG PO TABS: ORAL_TABLET | ORAL | 2 refills | Status: DC

## 2018-09-26 MED ORDER — LOSARTAN POTASSIUM 100 MG PO TABS: 100.0000 mg | ORAL_TABLET | Freq: Every day | ORAL | 2 refills | Status: DC

## 2018-09-26 NOTE — Patient Instructions (Signed)
Medication Instructions:  Your physician recommends that you continue on your current medications as directed. Please refer to the Current Medication list given to you today.  If you need a refill on your cardiac medications before your next appointment, please call your pharmacy.   Lab work: Your physician recommends that you return for lab work in: TODAY - CBC, CMET, LIPID.   If you have labs (blood work) drawn today and your tests are completely normal, you will receive your results only by: Marland Kitchen MyChart Message (if you have MyChart) OR . A paper copy in the mail If you have any lab test that is abnormal or we need to change your treatment, we will call you to review the results.  Testing/Procedures: none  Follow-Up: At [facility], you and your health needs are our priority.  As part of our continuing mission to provide you with exceptional heart care, we have created designated Provider Care Teams.  These Care Teams include your primary Cardiologist (physician) and Advanced Practice Providers (APPs -  Physician Assistants and Nurse Practitioners) who all work together to provide you with the care you need, when you need it. You will need a follow up appointment in 6 months.  Please call our office 2 months in advance to schedule this appointment.  You may see Ida Rogue, MD or one of the following Advanced Practice Providers on your designated Care Team:   Murray Hodgkins, NP Christell Faith, PA-C . Marrianne Mood, PA-C

## 2018-09-26 NOTE — Telephone Encounter (Signed)
Attempted to reach patient. No answer and VM is full.   Attempted to reach mother, no answer and call cannot be completed at this time.

## 2018-09-26 NOTE — Telephone Encounter (Signed)
This patient was seen today. Did you need clarification on this?

## 2018-09-26 NOTE — Telephone Encounter (Signed)
New Message  Pts mom called and verbalized pt is taking everything on his medication list besides atorvastatin.

## 2018-09-26 NOTE — Progress Notes (Signed)
Office Visit    Patient Name: Austin Holmes Date of Encounter: 09/26/2018  Primary Care Provider:  Patient, [name] Primary Cardiologist:  Ida Rogue, MD  Chief Complaint    Mr. 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    Past Medical History    Past Medical History:  Diagnosis Date  . Alcohol abuse   . HLD (hyperlipidemia)   . Myxoid liposarcoma (HCC)    L thigh  . NICM (nonischemic cardiomyopathy) ([place])    a. 11/2015 Cath: nl cors; b. 04/2016 Echo: prominent LV trabeculations, EF 20-25, diff HK, mild LAE; c. 08/2017 Echo: EF 20-25%, diff HK. Gr2 DD. Mildly dil LA. Nl RV fxn.  . Stroke [facility]) 2017   Past Surgical History:  Procedure Laterality Date  . CARDIAC CATHETERIZATION N/A 11/30/2015   Procedure: Left Heart Cath and Coronary Angiography;  Surgeon: Jettie Booze, MD;  Location: [place] CV [facility];  Service: Cardiovascular;  Laterality: N/A;  . EXCISION OF LEFT THIGH MASS Left 05/24/2016  . MASS EXCISION Left 05/24/2016   Procedure: EXCISION of left thigh MASS;  Surgeon: Hall Busing, MD;  Location: [place];  Service: General;  Laterality: Left;  . none    . TEE WITHOUT CARDIOVERSION N/A 11/11/2015   Procedure: TRANSESOPHAGEAL ECHOCARDIOGRAM (TEE);  Surgeon: Herminio Commons, MD;  Location: [place];  Service: Cardiovascular;  Laterality: N/A;    Allergies  No Known Allergies  History of Present Illness    [name] is a 48 year old male with a history of a stroke with residual right sided weakness in April of 2017.  CTa of head/neck in April 2017 was negative for carotid stenosis.  Echocardiogram done in May of 2017 showed an EF of 20-25%, cardiac cath at the same time revealed no coronary disease.  He was also found to have a LBBB.  In October of 2017 he was  hospitalized with a left proximal thigh lipomatous tumor, liposarcoma, and underwent a radical resection.  Follow up echocardiogram in October 2017 showed diffuse hypokinesis and an EF of 20-25%.  Last seen January of 2019 when he reported drinking 3 Bud Lights each week and was otherwise was doing well.  Echocardiogram from February 2019 showed an EF of 20-25%.  [name] was prescribed in January of 2019 but was never filled due to lack of insurance and cost concerns.  October 2019 he called the office with concerns of dizziness with blood pressures of 100/60.    Today [name] reports "feeling fine" mostly.  He does admit to dizziness with quick position changes, but not at rest.  He discussed cooking his own food, stating "I don't eat out" and that he does not add salt to his food.  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  During these episodes he feels his heart race, and rests for 10 minutes and then is able to continue with work.  He reported feeling that his RLE was "hard and swollen" in comparison to his LLE.  The RLE begins to become sore during work and requires stretching at night to feel better.  During examination BLE are similar size and no swelling noted.  When asked about alcohol intake, he admits to drinking 2 Bud Lights every night.  He denies chest pain, palpitations, pnd, orthopnea, n, v, syncope, weight gain, or early satiety.   Home Medications    Prior to Admission medications   Medication Sig Start Date End Date Taking? Authorizing Provider  aspirin EC 325 MG EC tablet Take 1 tablet (325 mg total) by mouth daily. 11/09/15  Yes Love, Ivan Anchors, PA-C  atorvastatin (LIPITOR) 40 MG tablet Take 1 tablet (40 mg total) by mouth daily at 6 PM. 08/27/17  Yes Gollan, Kathlene November, MD  carvedilol (COREG) 25 MG tablet Take 25 mg by mouth 2 (two) times daily with a meal.   Yes [provider]  furosemide (LASIX) 40 MG  tablet TAKE 1 TABLET BY MOUTH ONCE DAILY---PATIENT NEEDS TO SCHEDULE APPOINTMENT FOR REFILLS 09/05/18  Yes Gollan, Kathlene November, MD  losartan (COZAAR) 100 MG tablet Take 100 mg by mouth daily.   Yes [provider]    Review of Systems    Cardiac: ++dsypnea and increased HR with strenuous activity - but does well w/ routine activites, denies chest pain, palpitations, pnd, orthopnea, n, v, syncope, edema, weight gain, or early satiety. MS: ++pain in R thigh Psych: ++Drinks 2 Bud lights a night All other systems reviewed and are otherwise negative except as noted above.  Physical Exam    VS:  BP 110/60 (BP Location: Left Arm, Patient Position: Sitting, Cuff Size: Normal)   Pulse 73   Ht 6\' 1"  (1.854 m)   Wt 250 lb 12 oz (113.7 kg)   BMI 33.08 kg/m  , BMI Body mass index is 33.08 kg/m. GEN: Well nourished, well developed, in no acute distress. HEENT: normal. Neck: Supple, no JVD, carotid bruits, or masses. Cardiac: RRR, no murmurs, rubs, or gallops. No clubbing, cyanosis, edema.  Radials/DP/PT 2+ and equal bilaterally.  Respiratory:  Respirations regular and unlabored, clear to auscultation bilaterally. GI: Soft, nontender, nondistended, BS + x 4. MS: no deformity or atrophy. Skin: warm and dry, no rash. Neuro:  Strength and sensation are intact. Psych: Normal affect.  Accessory Clinical Findings    ECG personally reviewed by me today - NSR with a LBBB and inferior T wave inversions - no acute changes.  Assessment & Plan    1.  Non-ischemic dilated cardiomyopathy/HFrEF Reports overall feeling well, weight is the same as visit last year (250 lb.), no swelling noted on examination.  He notes occasional dizziness w/ quick position changes and dyspnea with strenuous activity, but overall does well.  He does not currently weigh daily.  We discussed the importance of daily weights, sodium restriction, medication compliance, and symptom reporting and he verbalizes understanding.  He  remains on ARB,  blocker and diuretic.  He cannot afford entresto. We discussed the importance of complete alcohol cessation at length.  He is not an ICD candidate so long as he continues to drink.  His mother verbalized understanding, however he does not show any interest in quitting drinking.  Will draw BMET today to monitor electrolytes and renal function. Could consider spiro in the future, though bp on soft side.  2. Mixed Hyperlipidemia Per previous visit documentation, he was encouraged to continue taking his atorvastatin 40 mg.  Today his mother who states she takes care of his medications believes it was discontinued.  Due to the previous history of CVA, I recommended he continue taking atorvastatin.  Will check lipid panel and liver enzymes today as he does not have a PCP.  3. History of CVA Recommended to continue taking aspirin and  atorvastatin prescriptions.  4. ETOH abuse Strongly advised that he stop consuming alcohol.  He stated "I need my Bud Light", reported at home there is nothing for him to do.  Discussed the role alcohol is playing in his reduced heart function.  He did not show any signs that he would stop drinking.  5. Right lower extremity pain RLE on examination was same size as LLE, no edema or tightness noted.  Discussed topical agents/NSAIDs/Tylenol that might assist with soreness/musculoskeletal pain.  Offered U/S of RLE if patient wanted to rule out any other abnormality.  He declined at this time, instructed to monitor weight and to call back if redness/warmth/edema are noted at any point.  6. Disposition Will check BMET/lipid panel/liver enzymes today, medications refilled as needed.  Scheduled for follow up in 6 months with Dr. Rockey Situ.  Advised that he connect with primary care services.   Murray Hodgkins, NP 09/26/2018, 9:30 AM

## 2018-09-27 LAB — CBC WITH DIFFERENTIAL/PLATELET
Basophils Absolute: 0 10*3/uL (ref 0.0–0.2)
Basos: 1 %
EOS (ABSOLUTE): 0.1 10*3/uL (ref 0.0–0.4)
Eos: 2 %
Hematocrit: 40.1 % (ref 37.5–51.0)
Hemoglobin: 13.3 g/dL (ref 13.0–17.7)
Immature Grans (Abs): 0 10*3/uL (ref 0.0–0.1)
Immature Granulocytes: 0 %
Lymphocytes Absolute: 1.2 10*3/uL (ref 0.7–3.1)
Lymphs: 29 %
MCH: 27.9 pg (ref 26.6–33.0)
MCHC: 33.2 g/dL (ref 31.5–35.7)
MCV: 84 fL (ref 79–97)
Monocytes Absolute: 0.4 10*3/uL (ref 0.1–0.9)
Monocytes: 10 %
Neutrophils Absolute: 2.3 10*3/uL (ref 1.4–7.0)
Neutrophils: 58 %
Platelets: 208 10*3/uL (ref 150–450)
RBC: 4.76 x10E6/uL (ref 4.14–5.80)
RDW: 13.6 % (ref 11.6–15.4)
WBC: 3.9 10*3/uL (ref 3.4–10.8)

## 2018-09-27 LAB — COMPREHENSIVE METABOLIC PANEL
ALT: 12 IU/L (ref 0–44)
AST: 14 IU/L (ref 0–40)
Albumin/Globulin Ratio: 1.5 (ref 1.2–2.2)
Albumin: 4.8 g/dL (ref 4.0–5.0)
Alkaline Phosphatase: 51 IU/L (ref 39–117)
BUN/Creatinine Ratio: 12 (ref 9–20)
BUN: 15 mg/dL (ref 6–24)
Bilirubin Total: 0.4 mg/dL (ref 0.0–1.2)
CO2: 24 mmol/L (ref 20–29)
Calcium: 9.6 mg/dL (ref 8.7–10.2)
Chloride: 100 mmol/L (ref 96–106)
Creatinine, Ser: 1.24 mg/dL (ref 0.76–1.27)
GFR calc Af Amer: 78 mL/min/{1.73_m2} (ref >59)
GFR calc non Af Amer: 68 mL/min/{1.73_m2} (ref >59)
Globulin, Total: 3.2 g/dL (ref 1.5–4.5)
Glucose: 89 mg/dL (ref 65–99)
Potassium: 4.8 mmol/L (ref 3.5–5.2)
Sodium: 138 mmol/L (ref 134–144)
Total Protein: 8 g/dL (ref 6.0–8.5)

## 2018-09-27 LAB — LIPID PANEL
Chol/HDL Ratio: 3.9 ratio (ref 0.0–5.0)
Cholesterol, Total: 207 mg/dL — ABNORMAL HIGH (ref 100–199)
HDL: 53 mg/dL (ref >39)
LDL Calculated: 141 mg/dL — ABNORMAL HIGH (ref 0–99)
Triglycerides: 63 mg/dL (ref 0–149)
VLDL Cholesterol Cal: 13 mg/dL (ref 5–40)

## 2018-10-01 ENCOUNTER — Telehealth: Payer: Self-pay

## 2018-10-01 NOTE — Telephone Encounter (Signed)
2nd attempt to contact the patient with lab results and Ignacia Bayley, NP recommendation. Unable to lmom, the patient's voicemail is full.

## 2018-10-01 NOTE — Telephone Encounter (Signed)
-----   Message from Theora Gianotti, NP sent at 09/27/2018 12:35 PM EST ----- Blood counts, kidney fxn, lytes, lft's ok. Chol is elevated - - higher than previous.  It appears that he is not taking atorvastatin as rx.  With h/o stroke, he should resume @ 40mg  daily (f/u lipids/lft's 6 wks after resumption).

## 2018-10-02 NOTE — Telephone Encounter (Signed)
3 rd attempt to reach pt to discuss lab results and POC. No voicemail available.   Letter sent:  "Dear Austin Holmes,  This letter is to inform you to call the office for lab results. We have made 3 attempts to reach you by phone @ [phone]. Voicemail box was full.   Please return call to clinic so we can discuss your plan of care. If you would like to update your account with any new numbers you can do so at that time.   Thank you for allowing [place] to care for you.   Sincerely,    Kamaryn Grimley RN [facility]  [phone]"  Encounter closed at this time.

## 2018-10-02 NOTE — Telephone Encounter (Signed)
-----   Message from Theora Gianotti, NP sent at 09/27/2018 12:35 PM EST ----- Blood counts, kidney fxn, lytes, lft's ok. Chol is elevated - - higher than previous.  It appears that he is not taking atorvastatin as rx.  With h/o stroke, he should resume @ 40mg  daily (f/u lipids/lft's 6 wks after resumption).

## 2019-06-30 ENCOUNTER — Telehealth: Payer: Self-pay

## 2019-06-30 MED ORDER — FUROSEMIDE 40 MG PO TABS: ORAL_TABLET | ORAL | 0 refills | Status: DC

## 2019-06-30 NOTE — Telephone Encounter (Signed)
Requested Prescriptions   Signed Prescriptions Disp Refills  . furosemide (LASIX) 40 MG tablet 30 tablet 0    Sig: TAKE 1 TABLET BY MOUTH ONCE DAILY. *NEEDS OFFICE VISIT FOR FURTHER REFILLS*    Authorizing Provider: Murray Hodgkins RONALD    Ordering User: NEWCOMER MCCLAIN, BRANDY L

## 2019-07-29 ENCOUNTER — Other Ambulatory Visit: Payer: Self-pay

## 2019-07-29 MED ORDER — FUROSEMIDE 40 MG PO TABS: ORAL_TABLET | ORAL | 0 refills | Status: DC

## 2019-07-29 NOTE — Telephone Encounter (Signed)
Patient wants this sent to [facility] on [address]

## 2019-07-29 NOTE — Telephone Encounter (Signed)
*  STAT* If patient is at the pharmacy, call can be transferred to refill team.   1. Which medications need to be refilled? (please list name of each medication and dose if known) Furosemide  2. Which pharmacy/location (including street and city if local pharmacy) is medication to be sent to? [facility]  3. Do they need a 30 day or 90 day supply? [place]

## 2019-07-29 NOTE — Telephone Encounter (Signed)
furosemide (LASIX) 40 MG tablet 90 tablet 0 07/29/2019    Sig: TAKE 1 TABLET BY MOUTH ONCE DAILY. *NEEDS OFFICE VISIT FOR FURTHER REFILLS*   Sent to pharmacy as: furosemide (LASIX) 40 MG tablet   Notes to Pharmacy: Patient needs an appointment for further refills, Thanks ! [phone]   E-Prescribing Status: Transmission to pharmacy in progress (07/29/2019 11:49 AM EST)   Pharmacy  [facility] [place] ([address]), [place] - [place]

## 2019-09-19 ENCOUNTER — Telehealth: Payer: Self-pay

## 2019-09-19 ENCOUNTER — Other Ambulatory Visit: Payer: Self-pay | Admitting: *Deleted

## 2019-09-19 MED ORDER — CARVEDILOL 25 MG PO TABS: 25.0000 mg | ORAL_TABLET | Freq: Two times a day (BID) | ORAL | 0 refills | Status: DC

## 2019-09-19 MED ORDER — LOSARTAN POTASSIUM 100 MG PO TABS: 100.0000 mg | ORAL_TABLET | Freq: Every day | ORAL | 0 refills | Status: DC

## 2019-09-19 NOTE — Telephone Encounter (Signed)
Left voicemail message requesting for patient to call back to schedule appointment.   Requested Prescriptions   Signed Prescriptions Disp Refills  . carvedilol (COREG) 25 MG tablet 60 tablet 0    Sig: Take 1 tablet (25 mg total) by mouth 2 (two) times daily with a meal. Please call to schedule office visit for further refills. Thank you!    Authorizing Provider: Theora Gianotti    Ordering User: Raelene Bott, Christna Kulick L

## 2019-10-29 ENCOUNTER — Other Ambulatory Visit: Payer: Self-pay | Admitting: Cardiovascular Disease

## 2019-10-29 MED ORDER — FUROSEMIDE 40 MG PO TABS: ORAL_TABLET | ORAL | 0 refills | Status: DC

## 2019-10-29 MED ORDER — LOSARTAN POTASSIUM 100 MG PO TABS: 100.0000 mg | ORAL_TABLET | Freq: Every day | ORAL | 0 refills | Status: DC

## 2019-10-29 NOTE — Telephone Encounter (Signed)
*  STAT* If patient is at the pharmacy, call can be transferred to refill team.   1. Which medications need to be refilled? (please list name of each medication and dose if known) Losartan 100mg , furosemide 40 mg  2. Which pharmacy/location (including street and city if local pharmacy) is medication to be sent to? [facility] on [address]  3. Do they need a 30 day or 90 day supply? 90  Patient is scheduled with Laurann Montana next week

## 2019-10-29 NOTE — Telephone Encounter (Signed)
Requested Prescriptions   Signed Prescriptions Disp Refills  . furosemide (LASIX) 40 MG tablet 30 tablet 0    Sig: TAKE 1 TABLET BY MOUTH ONCE DAILY.    Authorizing Provider: Theora Gianotti    Ordering User: Eugenio Hoes, Jearlean Demauro C  . losartan (COZAAR) 100 MG tablet 30 tablet 0    Sig: Take 1 tablet (100 mg total) by mouth daily.    Authorizing Provider: Theora Gianotti    Ordering User: Britt Bottom

## 2019-11-05 ENCOUNTER — Encounter: Payer: Self-pay | Admitting: Family

## 2019-11-05 ENCOUNTER — Ambulatory Visit (INDEPENDENT_AMBULATORY_CARE_PROVIDER_SITE_OTHER): Payer: Self-pay | Admitting: Family

## 2019-11-05 ENCOUNTER — Other Ambulatory Visit: Payer: Self-pay

## 2019-11-05 VITALS — BP 120/80 | HR 88 | Ht 73.0 in | Wt 261.2 lb

## 2019-11-05 DIAGNOSIS — E782 Mixed hyperlipidemia: Secondary | ICD-10-CM

## 2019-11-05 DIAGNOSIS — Z8673 Personal history of transient ischemic attack (TIA), and cerebral infarction without residual deficits: Secondary | ICD-10-CM

## 2019-11-05 DIAGNOSIS — E785 Hyperlipidemia, unspecified: Secondary | ICD-10-CM

## 2019-11-05 DIAGNOSIS — I428 Other cardiomyopathies: Secondary | ICD-10-CM

## 2019-11-05 DIAGNOSIS — I5020 Unspecified systolic (congestive) heart failure: Secondary | ICD-10-CM

## 2019-11-05 MED ORDER — ATORVASTATIN CALCIUM 40 MG PO TABS: 40.0000 mg | ORAL_TABLET | Freq: Every day | ORAL | 3 refills | Status: DC

## 2019-11-05 MED ORDER — FUROSEMIDE 40 MG PO TABS: ORAL_TABLET | ORAL | 3 refills | Status: DC

## 2019-11-05 MED ORDER — CARVEDILOL 25 MG PO TABS: 25.0000 mg | ORAL_TABLET | Freq: Two times a day (BID) | ORAL | 3 refills | Status: DC

## 2019-11-05 MED ORDER — LOSARTAN POTASSIUM 100 MG PO TABS: 100.0000 mg | ORAL_TABLET | Freq: Every day | ORAL | 3 refills | Status: DC

## 2019-11-05 NOTE — Progress Notes (Signed)
Office Visit    Patient Name: Austin Holmes Date of Encounter: 11/05/2019  Primary Care Provider:  Patient, [name] Primary Cardiologist:  Ida Rogue, MD Electrophysiologist:  None   Chief Complaint    Austin Holmes is a 49 y.o. 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  Past Medical History    Past Medical History:  Diagnosis Date  . Alcohol abuse   . HLD (hyperlipidemia)   . Myxoid liposarcoma (HCC)    L thigh  . NICM (nonischemic cardiomyopathy) ([place])    a. 11/2015 Cath: nl cors; b. 04/2016 Echo: prominent LV trabeculations, EF 20-25, diff HK, mild LAE; c. 08/2017 Echo: EF 20-25%, diff HK. Gr2 DD. Mildly dil LA. Nl RV fxn.  . Stroke [facility]) 2017   Past Surgical History:  Procedure Laterality Date  . CARDIAC CATHETERIZATION N/A 11/30/2015   Procedure: Left Heart Cath and Coronary Angiography;  Surgeon: Jettie Booze, MD;  Location: [place] [facility];  Service: Cardiovascular;  Laterality: N/A;  . EXCISION OF LEFT THIGH MASS Left 05/24/2016  . MASS EXCISION Left 05/24/2016   Procedure: EXCISION of left thigh MASS;  Surgeon: Hall Busing, MD;  Location: [place];  Service: General;  Laterality: Left;  . none    . TEE WITHOUT CARDIOVERSION N/A 11/11/2015   Procedure: TRANSESOPHAGEAL ECHOCARDIOGRAM (TEE);  Surgeon: Herminio Commons, MD;  Location: [facility] ENDOSCOPY;  Service: Cardiovascular;  Laterality: N/A;    Allergies  No Known Allergies  History of Present Illness    Austin Holmes is a 49 y.o. 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  He had a stroke April 2017 with residual right-sided weakness.  CTA of head/neck at that time negative for carotid stenosis.  Echo May 2008  with EF 20 to 25%, cardiac cath at same time with no evidence of coronary disease.  Noted LBBB.  October 2017 hospitalized with left proximal thigh lipomatous tumor, liposarcoma, underwent radical resection.  Follow-up echo October 2017 with diffuse hypokinesis and EF 20 to 25%.  Echo February 2019 EF 20 to 25%.  Entresto prescribed January 2019 but never filled due to lack of insurance and cost concerns.  October 2019 called office with concerns of dizziness and low blood pressures.  Reports feeling well since last seen. Walks on the track in [place] for exercise. Walks about an hour a few times per week. Reports no shortness of breath nor dyspnea on exertion. Endorses drinking 2 beers two nights per week. Reviewed recommendation for complete cessation of alcohol as it can be contributory to cardiomyopathy.   Checks blood pressure at home with readings 160/40. Reports never as high as 170. Reviewed BP goal of <130/80.   Reports no chest pain, pressure, tightness. No orthopnea, PND, edema.   Tells me he is working on Print production planner through [place].  Just received paperwork yesterday follow-up.  Long discussion regarding heart failure treatment modalities.  Discussed various recommended medication groups including ARB, beta-blocker, loop diuretic, MRA, SGLT2 I.  Encouraged him to call us when he has his insurance as he may be able to afford [place] with insurance assistance.  We discussed possibly adding spironolactone today but he is hesitant regarding medication changes prior to insurance approval.  EKGs/Labs/Other Studies Reviewed:   The following studies were reviewed today:  EKG:  EKG is  ordered today.  The ekg ordered today demonstrates SR 87 bpm with known LBBB.   Recent Labs: No results found for requested labs within last 8760 hours.  Recent Lipid Panel    Component Value Date/Time   CHOL 207 (H) 09/26/2018 0906   TRIG 63 09/26/2018 0906   HDL 53 09/26/2018 0906   CHOLHDL 3.9  09/26/2018 0906   CHOLHDL 2.2 11/02/2015 0557   VLDL 7 11/02/2015 0557   LDLCALC 141 (H) 09/26/2018 0906    Home Medications   Current Meds  Medication Sig  . aspirin EC 325 MG EC tablet Take 1 tablet (325 mg total) by mouth daily.  Marland Kitchen atorvastatin (LIPITOR) 40 MG tablet Take 1 tablet (40 mg total) by mouth daily at 6 PM.  . carvedilol (COREG) 25 MG tablet Take 1 tablet (25 mg total) by mouth 2 (two) times daily with a meal. Please call to schedule office visit for further refills. Thank you!  . furosemide (LASIX) 40 MG tablet TAKE 1 TABLET BY MOUTH ONCE DAILY.  Marland Kitchen losartan (COZAAR) 100 MG tablet Take 1 tablet (100 mg total) by mouth daily.    Review of Systems   Review of Systems  Constitution: Negative for chills, fever and malaise/fatigue.  Cardiovascular: Negative for chest pain, dyspnea on exertion, leg swelling, near-syncope, orthopnea, palpitations and syncope.  Respiratory: Negative for cough, shortness of breath and wheezing.   Gastrointestinal: Negative for nausea and vomiting.  Neurological: Negative for dizziness, light-headedness and weakness.   All other systems reviewed and are otherwise negative except as noted above.  Physical Exam    VS:  BP 120/80 (BP Location: Left Arm, Patient Position: Sitting, Cuff Size: Large)   Pulse 88   Ht 6\' 1"  (1.854 m)   Wt 261 lb 4 oz (118.5 kg)   SpO2 97%   BMI 34.47 kg/m  , BMI Body mass index is 34.47 kg/m. GEN: Well nourished, well developed, in no acute distress. HEENT: normal. Neck: Supple, no JVD, carotid bruits, or masses. Cardiac: RRR, no murmurs, rubs, or gallops. No clubbing, cyanosis, edema.  Radials/DP/PT 2+ and equal bilaterally.  Respiratory:  Respirations regular and unlabored, clear to auscultation bilaterally. GI: Soft, nontender, nondistended, BS + x 4. MS: No deformity or atrophy. Skin: Warm and dry, no rash. Neuro:  Strength and sensation are intact. Psych: Normal affect.  Accessory Clinical Findings     ECG personally reviewed by me today - SR 87 bpm with known LBBB - no acute changes.  Assessment & Plan    1. Nonischemic dilated cardiomyopathy/HFrEF - Echo 08/2017 LVEF 20-25%.  Euvolemic and well compensated on exam.  He is up 10 pounds compared to last year, but no signs of volume overload. NYHA I - Reports no dyspnea, edema, orthopnea.  GDMT includes ARB, beta-blocker, diuretic. Refill Losartan 100mg  daily, Coreg 25mg  BID, Lasix 40mg  daily. Unable to afford Entresto.  Reemphasized importance of complete alcohol cessation.  Not an ICD candidate as he continues to drink.  We discussed adding spironolactone today for further optimization of GDMT, he declines as he is in the process of getting insurance through [place]. Strongly encouraged to schedule repeat office visit when he has insurance as he may be able to get Entresto. May also benefit from repeat echocardiogram for re-evaluation of EF prior to up-titration of therapy in the future.   2. Mixed HLD, LDL goal <70 - Refill  Atorvastatin 40 mg daily.  Lipid panel and CMET today for monitoring. LDL goal <70 due to hx of CVA.   3. History of CVA -Continue aspirin 325 mg at direction of neurology and atorvastatin.  No bleeding complications on aspirin.  4. EtOH abuse -reports drinking 2 beers approximately 2 times per week.  Reemphasized importance of complete cessation for reduced heart function.  Disposition: CMET, CBC, lipid panel for monitoring. Follow up in 6 month(s) with Dr. Rockey Situ or APP.  Encouraged to establish with primary care.   Loel Dubonnet, NP 11/05/2019, 10:01 AM

## 2019-11-05 NOTE — Patient Instructions (Signed)
Medication Instructions:  Your physician recommends that you continue on your current medications as directed. Please refer to the Current Medication list given to you today.  Your cardiac medications have been refilled today.  The medication we discussed adding once you have obtained new insurance is called Spironolactone.  *If you need a refill on your cardiac medications before your next appointment, please call your pharmacy*   Lab Work: Cmet, Cbc, Lipid today If you have labs (blood work) drawn today and your tests are completely normal, you will receive your results only by: Marland Kitchen MyChart Message (if you have MyChart) OR . A paper copy in the mail If you have any lab test that is abnormal or we need to change your treatment, we will call you to review the results.   Testing/Procedures: None ordered   Follow-Up: At [facility], you and your health needs are our priority.  As part of our continuing mission to provide you with exceptional heart care, we have created designated Provider Care Teams.  These Care Teams include your primary Cardiologist (physician) and Advanced Practice Providers (APPs -  Physician Assistants and Nurse Practitioners) who all work together to provide you with the care you need, when you need it.  We recommend signing up for the patient portal called "MyChart".  Sign up information is provided on this After Visit Summary.  MyChart is used to connect with patients for Virtual Visits (Telemedicine).  Patients are able to view lab/test results, encounter notes, upcoming appointments, etc.  Non-urgent messages can be sent to your provider as well.   To learn more about what you can do with MyChart, go to [email or web address].    Your next appointment:   6 month(s)  The format for your next appointment:   In Person  Provider:    You may see Ida Rogue, MD or one of the following Advanced Practice Providers on your designated Care Team:     Murray Hodgkins, NP  Christell Faith, PA-C  Marrianne Mood, PA-C    Other Instructions N/A

## 2019-11-06 ENCOUNTER — Encounter: Payer: Self-pay | Admitting: Family

## 2019-11-06 ENCOUNTER — Telehealth: Payer: Self-pay | Admitting: *Deleted

## 2019-11-06 DIAGNOSIS — Z8673 Personal history of transient ischemic attack (TIA), and cerebral infarction without residual deficits: Secondary | ICD-10-CM

## 2019-11-06 DIAGNOSIS — E785 Hyperlipidemia, unspecified: Secondary | ICD-10-CM

## 2019-11-06 LAB — LIPID PANEL
Chol/HDL Ratio: 4.7 ratio (ref 0.0–5.0)
Cholesterol, Total: 259 mg/dL — ABNORMAL HIGH (ref 100–199)
HDL: 55 mg/dL (ref >39)
LDL Chol Calc (NIH): 152 mg/dL — ABNORMAL HIGH (ref 0–99)
Triglycerides: 285 mg/dL — ABNORMAL HIGH (ref 0–149)
VLDL Cholesterol Cal: 52 mg/dL — ABNORMAL HIGH (ref 5–40)

## 2019-11-06 LAB — CBC WITH DIFFERENTIAL/PLATELET
Basophils Absolute: 0 10*3/uL (ref 0.0–0.2)
Basos: 1 %
EOS (ABSOLUTE): 0.1 10*3/uL (ref 0.0–0.4)
Eos: 3 %
Hematocrit: 38.1 % (ref 37.5–51.0)
Hemoglobin: 13.1 g/dL (ref 13.0–17.7)
Immature Grans (Abs): 0 10*3/uL (ref 0.0–0.1)
Immature Granulocytes: 0 %
Lymphocytes Absolute: 1.2 10*3/uL (ref 0.7–3.1)
Lymphs: 37 %
MCH: 29.3 pg (ref 26.6–33.0)
MCHC: 34.4 g/dL (ref 31.5–35.7)
MCV: 85 fL (ref 79–97)
Monocytes Absolute: 0.3 10*3/uL (ref 0.1–0.9)
Monocytes: 9 %
Neutrophils Absolute: 1.7 10*3/uL (ref 1.4–7.0)
Neutrophils: 50 %
Platelets: 182 10*3/uL (ref 150–450)
RBC: 4.47 x10E6/uL (ref 4.14–5.80)
RDW: 13.9 % (ref 11.6–15.4)
WBC: 3.2 10*3/uL — ABNORMAL LOW (ref 3.4–10.8)

## 2019-11-06 LAB — COMPREHENSIVE METABOLIC PANEL
ALT: 36 IU/L (ref 0–44)
AST: 40 IU/L (ref 0–40)
Albumin/Globulin Ratio: 1.3 (ref 1.2–2.2)
Albumin: 4.6 g/dL (ref 4.0–5.0)
Alkaline Phosphatase: 59 IU/L (ref 39–117)
BUN/Creatinine Ratio: 12 (ref 9–20)
BUN: 15 mg/dL (ref 6–24)
Bilirubin Total: 0.5 mg/dL (ref 0.0–1.2)
CO2: 22 mmol/L (ref 20–29)
Calcium: 9.6 mg/dL (ref 8.7–10.2)
Chloride: 102 mmol/L (ref 96–106)
Creatinine, Ser: 1.29 mg/dL — ABNORMAL HIGH (ref 0.76–1.27)
GFR calc Af Amer: 74 mL/min/{1.73_m2} (ref >59)
GFR calc non Af Amer: 64 mL/min/{1.73_m2} (ref >59)
Globulin, Total: 3.6 g/dL (ref 1.5–4.5)
Glucose: 99 mg/dL (ref 65–99)
Potassium: 4.9 mmol/L (ref 3.5–5.2)
Sodium: 140 mmol/L (ref 134–144)
Total Protein: 8.2 g/dL (ref 6.0–8.5)

## 2019-11-06 NOTE — Telephone Encounter (Signed)
-----   Message from Loel Dubonnet, NP sent at 11/06/2019  9:24 AM EDT ----- Electrolytes and liver function normal. Kidney function shows mild signs dehydration - recommend staying well hydrated. Blood counts stable, no evidence of anemia. WBC very mildly low - not of concern.   Lipid panel not at goal.Triglycerides elevated 285 (goal <150) - recommend reducing carbohydrates, sweets. LDL 152 with goal of less than 70 due to history of stroke. Recommend increasing Atorvastatin to 80mg  daily and recheck in 6 weeks.  Detailed MyChart message sent to patient with dietary and lifestyle recommendations.

## 2019-11-06 NOTE — Telephone Encounter (Signed)
Left voice mail message for patient to call back regarding results.

## 2019-11-11 NOTE — Telephone Encounter (Signed)
No answer. Left message to call back.   

## 2019-11-12 NOTE — Telephone Encounter (Signed)
Attempted to call the patient. No answer- I left a message on his identified voice mail to please call back to discuss results.

## 2019-11-13 ENCOUNTER — Encounter: Payer: Self-pay | Admitting: *Deleted

## 2019-11-13 MED ORDER — ATORVASTATIN CALCIUM 80 MG PO TABS: 80.0000 mg | ORAL_TABLET | Freq: Every day | ORAL | 1 refills | Status: DC

## 2019-11-13 NOTE — Telephone Encounter (Signed)
4th attempt to reach patient and no answer. Left message to call back and that I would be sending him a letter.  Letter mailed.

## 2020-05-08 NOTE — Progress Notes (Deleted)
NO SHOW

## 2020-05-10 ENCOUNTER — Ambulatory Visit: Payer: Self-pay | Admitting: Cardiovascular Disease

## 2020-05-10 DIAGNOSIS — E782 Mixed hyperlipidemia: Secondary | ICD-10-CM

## 2020-05-10 DIAGNOSIS — I5022 Chronic systolic (congestive) heart failure: Secondary | ICD-10-CM

## 2020-05-10 DIAGNOSIS — E785 Hyperlipidemia, unspecified: Secondary | ICD-10-CM

## 2020-05-10 DIAGNOSIS — I428 Other cardiomyopathies: Secondary | ICD-10-CM

## 2020-05-11 ENCOUNTER — Encounter: Payer: Self-pay | Admitting: Cardiovascular Disease

## 2020-11-17 ENCOUNTER — Other Ambulatory Visit: Payer: Self-pay | Admitting: Family

## 2020-11-18 NOTE — Telephone Encounter (Signed)
Please call pt to schedule follow-up appointment for refills. Thanks!

## 2020-11-18 NOTE — Telephone Encounter (Signed)
No ans. No VM

## 2020-11-24 NOTE — Telephone Encounter (Signed)
Attempted to schedule, no answer and no vm

## 2020-11-26 NOTE — Telephone Encounter (Signed)
Attempted to schedule no ans no vm  

## 2020-12-01 NOTE — Telephone Encounter (Signed)
Patient has been scheduled for 12/09/20 Please send refills to [facility] location patient is completely out.

## 2020-12-01 NOTE — Telephone Encounter (Signed)
Rx request sent to pharmacy.  

## 2020-12-01 NOTE — Telephone Encounter (Signed)
Unable to reach .  Closing encounter.  

## 2020-12-09 ENCOUNTER — Other Ambulatory Visit: Payer: Self-pay

## 2020-12-09 ENCOUNTER — Encounter: Payer: Self-pay | Admitting: Medical

## 2020-12-09 ENCOUNTER — Ambulatory Visit (INDEPENDENT_AMBULATORY_CARE_PROVIDER_SITE_OTHER): Payer: Self-pay | Admitting: Medical

## 2020-12-09 VITALS — BP 130/90 | HR 85 | Ht 72.0 in | Wt 249.0 lb

## 2020-12-09 DIAGNOSIS — E785 Hyperlipidemia, unspecified: Secondary | ICD-10-CM

## 2020-12-09 DIAGNOSIS — I5020 Unspecified systolic (congestive) heart failure: Secondary | ICD-10-CM

## 2020-12-09 DIAGNOSIS — I428 Other cardiomyopathies: Secondary | ICD-10-CM

## 2020-12-09 DIAGNOSIS — Z8673 Personal history of transient ischemic attack (TIA), and cerebral infarction without residual deficits: Secondary | ICD-10-CM

## 2020-12-09 DIAGNOSIS — I10 Essential (primary) hypertension: Secondary | ICD-10-CM

## 2020-12-09 NOTE — Progress Notes (Signed)
Cardiology Office Note:    Date:  12/09/2020   ID:  [name], DOB 1971/04/22, MRN [id number]  PCP:  Patient, [name] (Inactive)  [facility] Cardiologist:  Austin Rogue, MD  [place] Electrophysiologist:  None   Referring MD: No ref. provider found   Chief Complaint: 6 month overdue follow-up  History of Present Illness:    Austin Holmes is a 50 y.o. 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  She had a stroke and April 2017 with residual right-sided weakness.  CTA of head and neck at that time negative for carotid stenosis.  Echo in May 2018 showed EF 20 to 25%. Cardiac cath at that time showed no evidence of coronary disease.  Noted left bundle branch block.  In October 2017 patient was hospitalized with left proximal thigh lipomatous tumor, liposarcoma, underwent radial resection. Follow-up echo October 2017 showed diffuse hypokinesis and EF of 20-25%.  Echo February 2019 EF 20-25%. Austin Holmes was prescribed territory 2019 but never filled due to lack of insurance and cost concerns.  Patient called the office October 2019 with concerns for dizziness and low blood pressure.  Patient was last seen 11/05/2019 and had been feeling well since last visit. Denied shortness of breath or chest pain.  Drinking 2 beers daily.  Was working on Print production planner.  Losartan, Coreg, Lasix were refilled.  Still unable to afford Entresto.  Labs were drawn.  Today, the patient reports still doesn't have insurance, says it should be coming in soon. Still taking all his medications. Hasn't established with PCP. No recent labs. He says from a cardiac perspective he is doing well. He works in a factory. He is on his feet all day. No anginal symptoms. He walks once a week, walks on the track. Diet is good, does not eat out a lot.  He is still drinking a 6 pack of beers a week. He is not drinking every day. Denies LLe, orthopnea, pnd, palpitations. No recent fever, chills, nasuea, vomiting.  He is fasting today.  Lipid CMET TSH CBC  Past Medical History:  Diagnosis Date  . Alcohol abuse   . HLD (hyperlipidemia)   . Myxoid liposarcoma (HCC)    L thigh  . NICM (nonischemic cardiomyopathy) ([place])    a. 11/2015 Cath: nl cors; b. 04/2016 Echo: prominent LV trabeculations, EF 20-25, diff HK, mild LAE; c. 08/2017 Echo: EF 20-25%, diff HK. Gr2 DD. Mildly dil LA. Nl RV fxn.  . Stroke [facility]) 2017    Past Surgical History:  Procedure Laterality Date  . CARDIAC CATHETERIZATION N/A 11/30/2015   Procedure: Left Heart Cath and Coronary Angiography;  Surgeon: Jettie Booze, MD;  Location: [place] [facility];  Service: Cardiovascular;  Laterality: N/A;  . EXCISION OF LEFT THIGH MASS Left 05/24/2016  . MASS EXCISION Left 05/24/2016   Procedure: EXCISION of left thigh MASS;  Surgeon: Hall Busing, MD;  Location: [facility] [place];  Service: General;  Laterality: Left;  . none    . TEE WITHOUT CARDIOVERSION N/A 11/11/2015   Procedure: TRANSESOPHAGEAL ECHOCARDIOGRAM (TEE);  Surgeon: Herminio Commons, MD;  Location: [facility] ENDOSCOPY;  Service: Cardiovascular;  Laterality: N/A;    Current Medications: Current Meds  Medication Sig  . aspirin EC 325 MG EC tablet Take 1 tablet (325 mg total) by mouth daily.  Marland Kitchen atorvastatin (LIPITOR) 80 MG tablet  Take 1 tablet (80 mg total) by mouth daily.  . carvedilol (COREG) 25 MG tablet Take 1 tablet (25 mg total) by mouth 2 (two) times daily with a meal.  . furosemide (LASIX) 40 MG tablet Take 1 tablet by mouth once daily  . losartan (COZAAR) 100 MG tablet Take 1 tablet by mouth once daily     Allergies:   Patient has no known allergies.   Social History   Socioeconomic History  . Marital status: Single    Spouse name: Not on file  . Number of children: Not on file  . Years of education: Not on  file  . Highest education level: Not on file  Occupational History  . 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 Smoking status: Never Smoker  . Smokeless tobacco: Never Used  Vaping Use  . Vaping Use: Never used  Substance and Sexual Activity  . Alcohol use: Yes    Alcohol/week: 0.0 standard drinks    Comment: occasionally drinks beer  . Drug use: No  . Sexual activity: Not Currently  Other Topics Concern  . Not on file  Social History Narrative   Lives with mother Austin Holmes   Caffeine use: Soda daily   Social Determinants of Radio broadcast assistant Strain: Not on file  Food Insecurity: Not on file  Transportation Needs: Not on file  Physical Activity: Not on file  Stress: Not on file  Social Connections: Not on file     Family History: The patient's *family history includes Cancer in his father; Diabetes Mellitus II in his maternal grandfather and maternal grandmother.  ROS:   Please see the history of present illness.     All other systems reviewed and are negative.  EKGs/Labs/Other Studies Reviewed:    The following studies were reviewed today:  Echo 08/2017 Study Conclusions   - Left ventricle: The cavity size was moderately dilated. Systolic  function was severely reduced. The estimated ejection fraction  was in the range of 20% to 25%. Diffuse hypokinesis, wall motion  inferior and lateral best preserved. Features are consistent with  a pseudonormal left ventricular filling pattern, with concomitant  abnormal relaxation and increased filling pressure (grade 2  diastolic dysfunction).  - Left atrium: The atrium was mildly dilated.  - Right ventricle: Systolic function was normal.  - Pulmonary arteries: Systolic pressure was within the normal  range.   Echo 04/2016 Study Conclusions   - Left ventricle: Prominent LV apical trabeculations. The cavity  size was severely dilated. Wall thickness was normal. Systolic  function was  severely reduced. The estimated ejection fraction  was in the range of 20% to 25%. Diffuse hypokinesis. Doppler  parameters are consistent with both elevated ventricular  end-diastolic filling pressure and elevated left atrial filling  pressure.  - Left atrium: The atrium was mildly dilated.  - Atrial septum: No defect or patent foramen ovale was identified.   Cardiac cath 11/2015  There is severe left ventricular systolic dysfunction.  No significant coronary artery disease.  Elevated LVEDP.   Medical therapy for nonischemic cardiomyopathy.  Start Lasix and Potassium.  Check BMet in one week.  Limit sodium intake.    No further cardiac testing needed prior to surgery.    EKG:  EKG is  ordered today.  The ekg ordered today demonstrates NSR, 85bpm, LBBB, LVH, repol changes  Recent Labs: No results found for requested labs within last 8760 hours.  Recent Lipid Panel    Component Value  Date/Time   CHOL 259 (H) 11/05/2019 1027   TRIG 285 (H) 11/05/2019 1027   HDL 55 11/05/2019 1027   CHOLHDL 4.7 11/05/2019 1027   CHOLHDL 2.2 11/02/2015 0557   VLDL 7 11/02/2015 0557   LDLCALC 152 (H) 11/05/2019 1027     Physical Exam:    VS:  BP 130/90 (BP Location: Left Arm, Patient Position: Sitting, Cuff Size: Normal)   Pulse 85   Ht 6' (1.829 m)   Wt 249 lb (112.9 kg)   SpO2 98%   BMI 33.77 kg/m     Wt Readings from Last 3 Encounters:  12/09/20 249 lb (112.9 kg)  11/05/19 261 lb 4 oz (118.5 kg)  09/26/18 250 lb 12 oz (113.7 kg)     GEN:  Well nourished, well developed in no acute distress HEENT: Normal NECK: No JVD; No carotid bruits LYMPHATICS: No lymphadenopathy CARDIAC: RRR, no murmurs, rubs, gallops RESPIRATORY:  Clear to auscultation without rales, wheezing or rhonchi  ABDOMEN: Soft, non-tender, non-distended MUSCULOSKELETAL:  No edema; No deformity  SKIN: Warm and dry NEUROLOGIC:  Alert and oriented x 3 PSYCHIATRIC:  Normal affect   ASSESSMENT:    1.  NICM (nonischemic cardiomyopathy) ([place])   2. Hyperlipidemia LDL goal <70   3. History of CVA (cerebrovascular accident)   4. HFrEF (heart failure with reduced ejection fraction) ([place])   5. Essential hypertension    PLAN:    In order of problems listed above:   Nonischemic cardiomyopathy Last echo in 08/2017 showed LVEF 9604%, grade 2 diastolic dysfunction.  Patient is still drinking alcohol.  Since he did not have insurance he was unable to be started on Entresto.  He still does not have insurance, says he will get it soon. Continue Coreg 25 twice daily and losartan 100 mg daily.  Pressure and heart rate good.  He takes Lasix 40 mg daily.  He is euvolemic on exam.  I will repeat echo today and see him back in 1-2 months.  Encourage he establish with PCP.  Labs today: CMet, TSH, CBC  Hyperlipidemia Continue atorvastatin 40 mg daily.  He is fasting so I will check lipid panel.  History of CVA Continue aspirin 325 mg and statin.  Denies any residual right-sided weakness. Encouraged PCP establishment as above.  Alcohol abuse He is drinking a six-pack of beer a week.  Says he will work on decreasing alcohol intake.  Hypertension Blood pressure today 130/90.  He does not take his blood pressure at home regularly.  Continue Coreg and losartan.  Disposition: Follow up in 1-2 months with MD/APP  Signed, Jung Ingerson Ninfa Meeker, PA-C  12/09/2020 9:51 AM    [facility]

## 2020-12-09 NOTE — Patient Instructions (Signed)
Medication Instructions:  Your physician recommends that you continue on your current medications as directed. Please refer to the Current Medication list given to you today.  *If you need a refill on your cardiac medications before your next appointment, please call your pharmacy*   Lab Work: Your physician recommends that you have lab work TODAY: CMET, Lipid panel, CBC, TSH  If you have labs (blood work) drawn today and your tests are completely normal, you will receive your results only by: Marland Kitchen MyChart Message (if you have MyChart) OR . A paper copy in the mail If you have any lab test that is abnormal or we need to change your treatment, we will call you to review the results.   Testing/Procedures:  Your physician has requested that you have an echocardiogram. Echocardiography is a painless test that uses sound waves to create images of your heart. It provides your doctor with information about the size and shape of your heart and how well your heart's chambers and valves are working. This procedure takes approximately one hour. There are no restrictions for this procedure.   Follow-Up: At [facility], you and your health needs are our priority.  As part of our continuing mission to provide you with exceptional heart care, we have created designated Provider Care Teams.  These Care Teams include your primary Cardiologist (physician) and Advanced Practice Providers (APPs -  Physician Assistants and Nurse Practitioners) who all work together to provide you with the care you need, when you need it.  We recommend signing up for the patient portal called "MyChart".  Sign up information is provided on this After Visit Summary.  MyChart is used to connect with patients for Virtual Visits (Telemedicine).  Patients are able to view lab/test results, encounter notes, upcoming appointments, etc.  Non-urgent messages can be sent to your provider as well.   To learn more about what you can do with  MyChart, go to [email or web address].    Your next appointment:    Follow up after echo  The format for your next appointment:   In Person  Provider:   You may see Ida Rogue, MD or one of the following Advanced Practice Providers on your designated Care Team:     Cadence [place], [place]

## 2020-12-10 LAB — CBC
Hematocrit: 40.5 % (ref 37.5–51.0)
Hemoglobin: 13.4 g/dL (ref 13.0–17.7)
MCH: 28.3 pg (ref 26.6–33.0)
MCHC: 33.1 g/dL (ref 31.5–35.7)
MCV: 85 fL (ref 79–97)
Platelets: 216 10*3/uL (ref 150–450)
RBC: 4.74 x10E6/uL (ref 4.14–5.80)
RDW: 12.8 % (ref 11.6–15.4)
WBC: 3.7 10*3/uL (ref 3.4–10.8)

## 2020-12-10 LAB — COMPREHENSIVE METABOLIC PANEL
ALT: 15 IU/L (ref 0–44)
AST: 19 IU/L (ref 0–40)
Albumin/Globulin Ratio: 1.4 (ref 1.2–2.2)
Albumin: 4.8 g/dL (ref 3.8–4.9)
Alkaline Phosphatase: 53 IU/L (ref 44–121)
BUN/Creatinine Ratio: 10 (ref 9–20)
BUN: 12 mg/dL (ref 6–24)
Bilirubin Total: 0.5 mg/dL (ref 0.0–1.2)
CO2: 23 mmol/L (ref 20–29)
Calcium: 9.8 mg/dL (ref 8.7–10.2)
Chloride: 104 mmol/L (ref 96–106)
Creatinine, Ser: 1.21 mg/dL (ref 0.76–1.27)
Globulin, Total: 3.4 g/dL (ref 1.5–4.5)
Glucose: 96 mg/dL (ref 65–99)
Potassium: 4.6 mmol/L (ref 3.5–5.2)
Sodium: 142 mmol/L (ref 134–144)
Total Protein: 8.2 g/dL (ref 6.0–8.5)
eGFR: 72 mL/min/{1.73_m2} (ref >59)

## 2020-12-10 LAB — LIPID PANEL
Chol/HDL Ratio: 3.4 ratio (ref 0.0–5.0)
Cholesterol, Total: 228 mg/dL — ABNORMAL HIGH (ref 100–199)
HDL: 67 mg/dL (ref >39)
LDL Chol Calc (NIH): 141 mg/dL — ABNORMAL HIGH (ref 0–99)
Triglycerides: 115 mg/dL (ref 0–149)
VLDL Cholesterol Cal: 20 mg/dL (ref 5–40)

## 2020-12-10 LAB — TSH: TSH: 0.994 u[IU]/mL (ref 0.450–4.500)

## 2021-01-11 ENCOUNTER — Other Ambulatory Visit: Payer: Self-pay

## 2021-01-11 ENCOUNTER — Ambulatory Visit (INDEPENDENT_AMBULATORY_CARE_PROVIDER_SITE_OTHER): Payer: Self-pay

## 2021-01-11 DIAGNOSIS — I428 Other cardiomyopathies: Secondary | ICD-10-CM

## 2021-01-11 LAB — ECHOCARDIOGRAM COMPLETE
AR max vel: 2.84 cm2
AV Area VTI: 2.81 cm2
AV Area mean vel: 2.71 cm2
AV Mean grad: 4 mmHg
AV Peak grad: 7.7 mmHg
Ao pk vel: 1.39 m/s
Calc EF: 21.7 %
S' Lateral: 5.3 cm
Single Plane A2C EF: 21.7 %
Single Plane A4C EF: 19.6 %

## 2021-01-11 MED ORDER — PERFLUTREN LIPID MICROSPHERE
1.0000 mL | INTRAVENOUS | Status: AC | PRN
  Administered 2021-01-11: 2 mL via INTRAVENOUS

## 2021-01-12 ENCOUNTER — Telehealth: Payer: Self-pay | Admitting: Medical

## 2021-01-12 NOTE — Telephone Encounter (Signed)
Cadence Ninfa Meeker, PA-C  01/12/2021  9:07 AM EDT      LVEF still low 20-25%, G2DD (impaired relaxation), moderately dilated LA, mildMR.    Refer to EP for consideration of ICD

## 2021-01-12 NOTE — Telephone Encounter (Signed)
Attempted to call the patient at his home # on file [phone]. The person answering this # advised we had the wrong #. I have deleted this contact # from the patient's chart.  Attempted to call the patient at [phone]. No answer- unable to leave a message as the voice mail box is full.   Will attempt to call back at a later time.

## 2021-01-14 NOTE — Telephone Encounter (Signed)
Attempted to call the patient. No answer- voice mail box is full.  

## 2021-01-17 NOTE — Telephone Encounter (Signed)
No answer/Voicemail box is full.  

## 2021-01-18 ENCOUNTER — Encounter: Payer: Self-pay | Admitting: *Deleted

## 2021-01-18 NOTE — Telephone Encounter (Signed)
No response back from the patient. Letter mailed asking him to please call the office to discuss/ keep his scheduled follow up appointment for 02/01/21 at 8:00 am with Cadence Kathlen Mody, PA.

## 2021-02-01 ENCOUNTER — Ambulatory Visit: Payer: Self-pay | Admitting: Medical

## 2021-02-01 NOTE — Progress Notes (Deleted)
Cardiology Office Note:    Date:  02/01/2021   ID:  Austin Holmes, DOB 1970/08/29, MRN [id number]  PCP:  Patient, [name] (Inactive)  [facility] Cardiologist:  Ida Rogue, MD  [place] Electrophysiologist:  None   Referring MD: Antony Madura, PA-C   Chief Complaint: Follow-up Echo  History of Present Illness:    Austin Holmes is a 50 y.o. male with a hx of nonischemic cardiomyopathy (EF 20 to 25% by echo 39), CVA 2017, hyperlipidemia, myxoid liposarcoma 2017, and alcohol abuse who presents for follow-up.  He had a stroke in April 2017 with residual right-sided weakness CTA of the head and neck at that time negative for carotid stenosis.  Echo in May 2018 showed EF 20 to 25%.  Cardiac cath at that time showed no evidence of coronary disease.  Noted left bundle branch block on EKG.  In October 2017 patient was hospitalized with left proximal thigh lipomatous tumor, liposarcoma, and underwent radial resection.  Follow-up echo October 2017 showed diffuse hypokinesis with an EF of 2025%.  Echo in February 2019 showed EF 20 to 25%.  [name] was prescribed but never filled due to lack of insurance and cost concerns.    Patient was seen 11/05/2019 and was feeling well at that time.  Still drinking 2 beers a day.  Did not have insurance, unable to afford Entresto.  Compliant with other medications  Last seen 12/09/2020 for follow-up of nonischemic cardiomyopathy.  Still did not have insurance.  Doing well from a cardiac perspective, compliant with all medications.  Labs were drawn, an echo was ordered.  Today,     Past Medical History:  Diagnosis Date   Alcohol abuse    HLD (hyperlipidemia)    Myxoid liposarcoma (HCC)    L thigh   NICM (nonischemic cardiomyopathy) ([place])    a. 11/2015 Cath: nl cors; b. 04/2016 Echo: prominent LV trabeculations, EF 20-25, diff HK, mild LAE; c. 08/2017 Echo: EF 20-25%, diff HK. Gr2 DD. Mildly dil LA. Nl RV fxn.   Stroke [facility])  2017    Past Surgical History:  Procedure Laterality Date   CARDIAC CATHETERIZATION N/A 11/30/2015   Procedure: Left Heart Cath and Coronary Angiography;  Surgeon: Jettie Booze, MD;  Location: [place] [facility];  Service: Cardiovascular;  Laterality: N/A;   EXCISION OF LEFT THIGH MASS Left 05/24/2016   MASS EXCISION Left 05/24/2016   Procedure: EXCISION of left thigh MASS;  Surgeon: Hall Busing, MD;  Location: [place];  Service: General;  Laterality: Left;   none     TEE WITHOUT CARDIOVERSION N/A 11/11/2015   Procedure: TRANSESOPHAGEAL ECHOCARDIOGRAM (TEE);  Surgeon: Herminio Commons, MD;  Location: [place];  Service: Cardiovascular;  Laterality: N/A;    Current Medications: No outpatient medications have been marked as taking for the 02/01/21 encounter (Appointment) with Kathlen Mody, Drayden Lukas H, PA-C.     Allergies:   Patient has no known allergies.   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  Tobacco Use   Smoking status: Never   Smokeless tobacco: Never  Vaping Use   Vaping Use: Never used  Substance and Sexual Activity   Alcohol use: Yes    Alcohol/week: 0.0 standard  drinks    Comment: occasionally drinks beer   Drug use: No   Sexual activity: Not Currently  Other Topics Concern   Not on file  Social History Narrative   Lives with mother [name]   Caffeine use: Soda daily   Social Determinants of Radio broadcast assistant Strain: Not on file  Food Insecurity: Not on file  Transportation Needs: Not on file  Physical Activity: Not on file  Stress: Not on file  Social Connections: Not on file     Family History: The patient's ***family history includes Cancer in his father; Diabetes Mellitus II in his maternal grandfather and maternal  grandmother.  ROS:   Please see the history of present illness.    *** All other systems reviewed and are negative.  EKGs/Labs/Other Studies Reviewed:    The following studies were reviewed today:  Echo 2022   1. Left ventricular ejection fraction, by estimation, is 20 to 25%. The  left ventricle has severely decreased function. The left ventricle  demonstrates global hypokinesis. The left ventricular internal cavity size  was moderately dilated. Left  ventricular diastolic parameters are consistent with Grade II diastolic  dysfunction (pseudonormalization). The average left ventricular global  longitudinal strain is -7.0 %. The global longitudinal strain is abnormal.   2. Right ventricular systolic function is normal. The right ventricular  size is normal.   3. Left atrial size was moderately dilated.   4. The mitral valve is normal in structure. Mild mitral valve  regurgitation.   Echo 08/2017 Study Conclusions   - Left ventricle: The cavity size was moderately dilated. Systolic    function was severely reduced. The estimated ejection fraction    was in the range of 20% to 25%. Diffuse hypokinesis, wall motion    inferior and lateral best preserved. Features are consistent with    a pseudonormal left ventricular filling pattern, with concomitant    abnormal relaxation and increased filling pressure (grade 2    diastolic dysfunction).  - Left atrium: The atrium was mildly dilated.  - Right ventricle: Systolic function was normal.  - Pulmonary arteries: Systolic pressure was within the normal    range.    Echo 04/2016 Study Conclusions   - Left ventricle: Prominent LV apical trabeculations. The cavity    size was severely dilated. Wall thickness was normal. Systolic    function was severely reduced. The estimated ejection fraction    was in the range of 20% to 25%. Diffuse hypokinesis. Doppler    parameters are consistent with both elevated ventricular    end-diastolic  filling pressure and elevated left atrial filling    pressure.  - Left atrium: The atrium was mildly dilated.  - Atrial septum: No defect or patent foramen ovale was identified.   Cardiac cath 11/2015 There is severe left ventricular systolic dysfunction. No significant coronary artery disease. Elevated LVEDP.   Medical therapy for nonischemic cardiomyopathy.  Start Lasix and Potassium.  Check BMet in one week.  Limit sodium intake.     No further cardiac testing needed prior to surgery.    EKG:  EKG is *** ordered today.  The ekg ordered today demonstrates ***  Recent Labs: 12/09/2020: ALT 15; BUN 12; Creatinine, Ser 1.21; Hemoglobin 13.4; Platelets 216; Potassium 4.6; Sodium 142; TSH 0.994  Recent Lipid Panel    Component Value Date/Time   CHOL 228 (H) 12/09/2020 0832   TRIG 115 12/09/2020 0832   HDL 67 12/09/2020 0832   CHOLHDL 3.4  12/09/2020 0832   CHOLHDL 2.2 11/02/2015 0557   VLDL 7 11/02/2015 0557   LDLCALC 141 (H) 12/09/2020 0832     Risk Assessment/Calculations:   {Does this patient have ATRIAL FIBRILLATION?:[phone]}   Physical Exam:    VS:  There were no vitals taken for this visit.    Wt Readings from Last 3 Encounters:  12/09/20 249 lb (112.9 kg)  11/05/19 261 lb 4 oz (118.5 kg)  09/26/18 250 lb 12 oz (113.7 kg)     GEN: *** Well nourished, well developed in no acute distress HEENT: Normal NECK: No JVD; No carotid bruits LYMPHATICS: No lymphadenopathy CARDIAC: ***RRR, no murmurs, rubs, gallops RESPIRATORY:  Clear to auscultation without rales, wheezing or rhonchi  ABDOMEN: Soft, non-tender, non-distended MUSCULOSKELETAL:  No edema; No deformity  SKIN: Warm and dry NEUROLOGIC:  Alert and oriented x 3 PSYCHIATRIC:  Normal affect   ASSESSMENT:    No diagnosis found. PLAN:    In order of problems listed above:  NICM EF 20-25%  HLD  HTN  H/o CVA  Alcohol Abuse  Disposition: Follow up {follow up:15908} with ***   Shared Decision  Making/Informed Consent   {Are you ordering a CV Procedure (e.g. stress test, cath, DCCV, TEE, etc)?   Press F2        :[id number]}    Signed, Zorah Backes Ninfa Meeker, PA-C  02/01/2021 7:30 AM    [facility]

## 2021-02-02 ENCOUNTER — Encounter: Payer: Self-pay | Admitting: Medical

## 2021-02-22 ENCOUNTER — Other Ambulatory Visit: Payer: Self-pay | Admitting: Family

## 2021-02-23 NOTE — Telephone Encounter (Signed)
Rx(s) sent to pharmacy electronically.  

## 2021-04-12 ENCOUNTER — Other Ambulatory Visit: Payer: Self-pay | Admitting: Family

## 2021-11-16 ENCOUNTER — Other Ambulatory Visit: Payer: Self-pay | Admitting: Cardiovascular Disease

## 2022-02-09 ENCOUNTER — Other Ambulatory Visit: Payer: Self-pay | Admitting: Cardiovascular Disease

## 2022-02-09 NOTE — Telephone Encounter (Signed)
Unable to refill medications until patient seen in office. Patient did not show for appointment on 7/5. Please reschedule. Thank you!

## 2022-02-10 NOTE — Telephone Encounter (Signed)
Attempted to schedule.  LMOV to call office.  ° °

## 2022-02-14 NOTE — Telephone Encounter (Addendum)
Lmov to schedule  

## 2022-02-20 ENCOUNTER — Telehealth: Payer: Self-pay | Admitting: Cardiovascular Disease

## 2022-02-20 NOTE — Telephone Encounter (Signed)
*  STAT* If patient is at the pharmacy, call can be transferred to refill team.   1. Which medications need to be refilled? (please list name of each medication and dose if known) furosemide (LASIX) 40 MG tablet  2. Which pharmacy/location (including street and city if local pharmacy) is medication to be sent to? [facility] ([facility]), [place] [address]  3. Do they need a 30 day or 90 day supply? 90  Pt is completely out of this medication.   Pt has appt scheduled with Dr. Rockey Situ 09/27

## 2022-02-20 NOTE — Telephone Encounter (Signed)
Please advise if OK to refill. Patient has hx of no shows/cancelling appts. Last seen May 2022. Thank you!

## 2022-02-21 MED ORDER — FUROSEMIDE 40 MG PO TABS: 40.0000 mg | ORAL_TABLET | Freq: Every day | ORAL | 1 refills | Status: DC

## 2022-02-21 NOTE — Telephone Encounter (Signed)
Requested Prescriptions   Signed Prescriptions Disp Refills   furosemide (LASIX) 40 MG tablet 30 tablet 1    Sig: Take 1 tablet (40 mg total) by mouth daily.    Authorizing Provider: Minna Merritts    Ordering User: Raelene Bott, Romy Ipock L

## 2022-02-23 ENCOUNTER — Telehealth: Payer: Self-pay | Admitting: Cardiovascular Disease

## 2022-02-23 MED ORDER — LOSARTAN POTASSIUM 100 MG PO TABS: 100.0000 mg | ORAL_TABLET | Freq: Every day | ORAL | 0 refills | Status: DC

## 2022-02-23 NOTE — Telephone Encounter (Signed)
Requested Prescriptions   Signed Prescriptions Disp Refills   losartan (COZAAR) 100 MG tablet 30 tablet 0    Sig: Take 1 tablet (100 mg total) by mouth daily.    Authorizing Provider: Minna Merritts    Ordering User: Britt Bottom

## 2022-02-23 NOTE — Telephone Encounter (Signed)
*  STAT* If patient is at the pharmacy, call can be transferred to refill team.   1. Which medications need to be refilled? (please list name of each medication and dose if known) losartan (COZAAR) 100 MG tablet  2. Which pharmacy/location (including street and city if local pharmacy) is medication to be sent to? [facility] (N), [place] - [address]  3. Do they need a 30 day or 90 day supply? [place]

## 2022-02-27 NOTE — Telephone Encounter (Signed)
Attempted to schedule.  LMOV to call office.  ° °

## 2022-02-27 NOTE — Telephone Encounter (Signed)
Unable to reach closing encounter 

## 2022-03-22 ENCOUNTER — Other Ambulatory Visit: Payer: Self-pay | Admitting: Cardiovascular Disease

## 2022-03-25 ENCOUNTER — Other Ambulatory Visit: Payer: Self-pay | Admitting: Cardiovascular Disease

## 2022-03-27 ENCOUNTER — Telehealth: Payer: Self-pay | Admitting: Cardiovascular Disease

## 2022-03-27 MED ORDER — LOSARTAN POTASSIUM 100 MG PO TABS: 100.0000 mg | ORAL_TABLET | Freq: Every day | ORAL | 0 refills | Status: DC

## 2022-03-27 NOTE — Telephone Encounter (Signed)
Requested Prescriptions   Signed Prescriptions Disp Refills   losartan (COZAAR) 100 MG tablet 30 tablet 0    Sig: Take 1 tablet (100 mg total) by mouth daily. NO FURTHER REFILLS UNTIL SEEN IN OFFICE.    Authorizing Provider: Minna Merritts    Ordering User: Raelene Bott, Sandar Krinke L

## 2022-03-27 NOTE — Telephone Encounter (Signed)
Follow Up:      Would you please call this in today- patient is out of his medicine

## 2022-03-27 NOTE — Telephone Encounter (Signed)
*  STAT* If patient is at the pharmacy, call can be transferred to refill team.   1. Which medications need to be refilled? (please list name of each medication and dose if known) Losartan  2. Which pharmacy/location (including street and city if local pharmacy) is medication to be sent to? [facility] [place], [place]  3. Do they need a 30 day or 90 day supply? enough  until his appointment on 04-26-22

## 2022-04-25 NOTE — Progress Notes (Unsigned)
Cardiology Office Note  Date:  04/26/2022   ID:  Austin Holmes, DOB 1971/07/17, MRN [id number]  PCP:  Patient, [name]   Chief Complaint  Patient presents with   12 month follow up     "Doing well." Medications reviewed by the patient verbally.     HPI:  51 year old gentleman with past medical history of  nonischemic dilated cardiomyopathy,  ejection fraction 25%, in 2017 cardiac catheterization May 2017 with no coronary disease, history of chronic alcohol use/beer daily, much less now History of stroke April 2017,right side left bundle branch block  He presents for routine follow-up of his nonischemic dilated cardiomyopathy  Last seen by one of our providers May 2022 On that visit had no insurance, did not have PCP, no recent lab work Works in [employer], on his feet all day Was drinking a six pack beer week  In follow-up today reports that he feels well Continues to lift heavy cylinders, sometimes full of water, feels it is a strain on his body, back  Reports he is out of his Lasix and losartan for short period of time Denies leg swelling, no abdominal distention from fluid, no cough, no PND orthopnea  No recent hospitalizations Still drinks beer  EKG personally reviewed by myself on todays visit Normal sinus rhythm rate 75 bpm left bundle branch block  Other past medical history reviewed in the hospital October 2017 for left proximal thigh lipomatous tumor, liposarcoma He had radical resection, this measured 7 cm x 9 cm   Echocardiogram 05/18/2016  as well as prior study April 2017 Ejection fraction 20-25%, diffuse hypokinesis,  Previous echocardiogram April 2017 with ejection fraction 20%   Prior carotid ultrasound with no stenosis by report  PMH:   has a past medical history of Alcohol abuse, HLD (hyperlipidemia), 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  PSH:    Past Surgical History:  Procedure  Laterality Date   CARDIAC CATHETERIZATION N/A 11/30/2015   Procedure: Left Heart Cath and Coronary Angiography;  Surgeon: Jettie Booze, MD;  Location: [place] [facility];  Service: Cardiovascular;  Laterality: N/A;   EXCISION OF LEFT THIGH MASS Left 05/24/2016   MASS EXCISION Left 05/24/2016   Procedure: EXCISION of left thigh MASS;  Surgeon: Hall Busing, MD;  Location: [place];  Service: General;  Laterality: Left;   none     TEE WITHOUT CARDIOVERSION N/A 11/11/2015   Procedure: TRANSESOPHAGEAL ECHOCARDIOGRAM (TEE);  Surgeon: Herminio Commons, MD;  [facility];  Service: Cardiovascular;  Laterality: N/A;    Current Outpatient Medications  Medication Sig Dispense Refill   aspirin EC 81 MG tablet Take 1 tablet (81 mg total) by mouth daily. 90 tablet 3   atorvastatin (LIPITOR) 80 MG tablet Take 1 tablet (80 mg total) by mouth daily. 90 tablet 3   carvedilol (COREG) 25 MG tablet Take 1 tablet (25 mg total) by mouth 2 (two) times daily with a meal. 180 tablet 3   furosemide (LASIX) 40 MG tablet Take 1 tablet (40 mg total) by mouth daily. 90 tablet 3   losartan (COZAAR) 100 MG tablet Take 1 tablet (100 mg total) by mouth daily. 90 tablet 3   No current facility-administered medications for this visit.    Allergies:   Patient has no known allergies.   Social History:  The patient  reports that he has never smoked. He has never used smokeless tobacco. He reports current alcohol use. He  reports that he does not use drugs.   Family History:   family history includes Cancer in his father; Diabetes Mellitus II in his maternal grandfather and maternal grandmother.    Review of Systems: Review of Systems  Constitutional: Negative.   Respiratory: Negative.    Cardiovascular: Negative.   Gastrointestinal: Negative.   Musculoskeletal: Negative.   Neurological: Negative.   Psychiatric/Behavioral: Negative.    All other systems reviewed and are negative.   PHYSICAL  EXAM: VS:  BP (!) 140/82 (BP Location: Left Arm, Patient Position: Sitting, Cuff Size: Normal)   Pulse 85   Ht '6\' 1"'$  (1.854 m)   Wt 246 lb 2 oz (111.6 kg)   SpO2 98%   BMI 32.47 kg/m  , BMI Body mass index is 32.47 kg/m. Constitutional:  oriented to person, place, and time. No distress.  HENT:  Head: Grossly normal Eyes:  no discharge. No scleral icterus.  Neck: No JVD, no carotid bruits  Cardiovascular: Regular rate and rhythm, no murmurs appreciated Pulmonary/Chest: Clear to auscultation bilaterally, no wheezes or rails Abdominal: Soft.  no distension.  no tenderness.  Musculoskeletal: Normal range of motion Neurological:  normal muscle tone. Coordination normal. No atrophy Skin: Skin warm and dry Psychiatric: normal affect, pleasant  Recent Labs: No results found for requested labs within last 365 days.    Lipid Panel Lab Results  Component Value Date   CHOL 228 (H) 12/09/2020   HDL 67 12/09/2020   LDLCALC 141 (H) 12/09/2020   TRIG 115 12/09/2020      Wt Readings from Last 3 Encounters:  04/26/22 246 lb 2 oz (111.6 kg)  12/09/20 249 lb (112.9 kg)  11/05/19 261 lb 4 oz (118.5 kg)      ASSESSMENT AND PLAN:  Alcoholic cardiomyopathy ([place]) - Plan: EKG 12-Lead Recommend continued alcohol cessation including beer, this was discussed Medications refilled, he is out of several Weight is down from prior clinic visit BMP ordered  Mixed hyperlipidemia - Plan: EKG 12-Lead Encouraged him to stay on his Lipitor Lipid panel ordered through [facility]  Chronic systolic CHF (congestive heart failure) ([place]) - Plan: EKG 12-Lead Weight stable, trending downward, appears euvolemic BMP ordered, continue Lasix Recommend all alcohol cessation  Acute CVA (cerebrovascular accident) ([place]) - Plan: EKG 12-Lead Etiology unclear, possibly mural thrombus in the past Aspirin 81 daily  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 04/26/2022  [place], [place]

## 2022-04-26 ENCOUNTER — Encounter: Payer: Self-pay | Admitting: Cardiovascular Disease

## 2022-04-26 ENCOUNTER — Ambulatory Visit: Payer: Self-pay | Attending: Cardiovascular Disease | Admitting: Cardiovascular Disease

## 2022-04-26 VITALS — BP 140/82 | HR 85 | Ht 73.0 in | Wt 246.1 lb

## 2022-04-26 DIAGNOSIS — I428 Other cardiomyopathies: Secondary | ICD-10-CM

## 2022-04-26 DIAGNOSIS — G459 Transient cerebral ischemic attack, unspecified: Secondary | ICD-10-CM

## 2022-04-26 DIAGNOSIS — Z8673 Personal history of transient ischemic attack (TIA), and cerebral infarction without residual deficits: Secondary | ICD-10-CM

## 2022-04-26 DIAGNOSIS — E785 Hyperlipidemia, unspecified: Secondary | ICD-10-CM

## 2022-04-26 DIAGNOSIS — I10 Essential (primary) hypertension: Secondary | ICD-10-CM

## 2022-04-26 DIAGNOSIS — I5020 Unspecified systolic (congestive) heart failure: Secondary | ICD-10-CM

## 2022-04-26 MED ORDER — FUROSEMIDE 40 MG PO TABS: 40.0000 mg | ORAL_TABLET | Freq: Every day | ORAL | 3 refills | Status: DC

## 2022-04-26 MED ORDER — ASPIRIN 81 MG PO TBEC: 81.0000 mg | DELAYED_RELEASE_TABLET | Freq: Every day | ORAL | 3 refills | Status: AC

## 2022-04-26 MED ORDER — ATORVASTATIN CALCIUM 80 MG PO TABS: 80.0000 mg | ORAL_TABLET | Freq: Every day | ORAL | 3 refills | Status: DC

## 2022-04-26 MED ORDER — CARVEDILOL 25 MG PO TABS: 25.0000 mg | ORAL_TABLET | Freq: Two times a day (BID) | ORAL | 3 refills | Status: DC

## 2022-04-26 MED ORDER — LOSARTAN POTASSIUM 100 MG PO TABS: 100.0000 mg | ORAL_TABLET | Freq: Every day | ORAL | 3 refills | Status: DC

## 2022-04-26 NOTE — Patient Instructions (Addendum)
Medication Instructions:  Refills provided  If you need a refill on your cardiac medications before your next appointment, please call your pharmacy.   Lab work: No new labs needed  Testing/Procedures: BMP, lipids through [facility]  Follow-Up: At [facility], you and your health needs are our priority.  As part of our continuing mission to provide you with exceptional heart care, we have created designated Provider Care Teams.  These Care Teams include your primary Cardiologist (physician) and Advanced Practice Providers (APPs -  Physician Assistants and Nurse Practitioners) who all work together to provide you with the care you need, when you need it.  You will need a follow up appointment in 12 months  Providers on your designated Care Team:   Murray Hodgkins, NP Christell Faith, PA-C Cadence Kathlen Mody, [place]  COVID-19 Vaccine Information can be found at: [email or web address].co.uk For questions related to vaccine distribution or appointments, please email [email or web address] .[email or web address] or call [phone].

## 2022-09-05 ENCOUNTER — Ambulatory Visit (INDEPENDENT_AMBULATORY_CARE_PROVIDER_SITE_OTHER): Payer: Self-pay

## 2022-09-05 ENCOUNTER — Ambulatory Visit
Admission: EM | Admit: 2022-09-05 | Discharge: 2022-09-05 | Disposition: A | Discharge status: Home / Self Care | Payer: Self-pay | Attending: Family | Admitting: Family

## 2022-09-05 DIAGNOSIS — M79674 Pain in right toe(s): Secondary | ICD-10-CM | POA: Insufficient documentation

## 2022-09-05 DIAGNOSIS — R2241 Localized swelling, mass and lump, right lower limb: Secondary | ICD-10-CM | POA: Insufficient documentation

## 2022-09-05 DIAGNOSIS — Z8679 Personal history of other diseases of the circulatory system: Secondary | ICD-10-CM | POA: Insufficient documentation

## 2022-09-05 DIAGNOSIS — M79671 Pain in right foot: Secondary | ICD-10-CM

## 2022-09-05 DIAGNOSIS — M7731 Calcaneal spur, right foot: Secondary | ICD-10-CM | POA: Insufficient documentation

## 2022-09-05 LAB — BASIC METABOLIC PANEL
Anion gap: 9 (ref 5–15)
BUN: 15 mg/dL (ref 6–20)
CO2: 26 mmol/L (ref 22–32)
Calcium: 9.1 mg/dL (ref 8.9–10.3)
Chloride: 101 mmol/L (ref 98–111)
Creatinine, Ser: 1.09 mg/dL (ref 0.61–1.24)
GFR, Estimated: >60 mL/min (ref >60)
Glucose, Bld: 105 mg/dL — ABNORMAL HIGH (ref 70–99)
Potassium: 4.2 mmol/L (ref 3.5–5.1)
Sodium: 136 mmol/L (ref 135–145)

## 2022-09-05 LAB — URIC ACID: Uric Acid, Serum: 7.3 mg/dL (ref 3.7–8.6)

## 2022-09-05 MED ORDER — PREDNISONE 20 MG PO TABS: 40.0000 mg | ORAL_TABLET | Freq: Every day | ORAL | 0 refills | Status: AC

## 2022-09-05 NOTE — Discharge Instructions (Signed)
Recommend start Prednisone '40mg'$  daily for 5 days then stop. May take OTC Ibuprofen '600mg'$  every 8 hours after finishing Prednisone if still having any pain or swelling. May apply cool compresses to area and alternate with heat for comfort. Keep right foot elevated as much as possible. Follow-up with a PCP or return here to Urgent Care in 3 to 4 days if not improving.

## 2022-09-05 NOTE — ED Provider Notes (Signed)
[facility]    CSN: [id number] Arrival date & time: 09/05/22  0813      History   Chief Complaint Chief Complaint  Patient presents with   Foot Pain    HPI Austin Holmes is a 52 y.o. male.   52 year old male presents with right great toe swelling, pain, redness and warmth that started yesterday. Unable to put boot on for work today due to pain and swelling. Pain travels into right mid-foot. No heel pain. No previous history of similar symptoms. No known trauma or injury. Has not taken any medication yet for pain. Does have a history of stroke in 2017 and still some right upper thigh swelling and pain but denies any lower leg swelling. Also history of CHF and HTN- currently managed with Losartan, Coreg, aspirin and Lasix '40mg'$  daily. Also history of hyperlipidemia and takes Lipitor daily. No recent alcohol use. Does not currently have insurance. 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 Previous lab work in 2022 that was unremarkable except elevated cholesterol.   The history is provided by the patient.    Past Medical History:  Diagnosis Date   Alcohol abuse    HLD (hyperlipidemia)    Myxoid liposarcoma (HCC)    L thigh   NICM (nonischemic cardiomyopathy) ([place])    a. 11/2015 Cath: nl cors; b. 04/2016 Echo: prominent LV trabeculations, EF 20-25, diff HK, mild LAE; c. 08/2017 Echo: EF 20-25%, diff HK. Gr2 DD. Mildly dil LA. Nl RV fxn.   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[place]) 94/85/4627   Chronic systolic CHF (congestive heart failure) (HCC)    Malignant lipomatous tumor (HCC)    Idiopathic hypotension    Mass of thigh    Stroke due to embolism of left posterior cerebral artery ([place]) 11/03/2015   Cardiomyopathy ([place]) 11/03/2015   Abnormality of gait    Impulsive    HLD (hyperlipidemia)    TIA (transient ischemic attack)  11/02/2015   Acute CVA (cerebrovascular accident) ([place]) 11/02/2015    Past Surgical History:  Procedure Laterality Date   CARDIAC CATHETERIZATION N/A 11/30/2015   Procedure: Left Heart Cath and Coronary Angiography;  Surgeon: Jettie Booze, MD;  Location: [place] [facility];  Service: Cardiovascular;  Laterality: N/A;   EXCISION OF LEFT THIGH MASS Left 05/24/2016   MASS EXCISION Left 05/24/2016   Procedure: EXCISION of left thigh MASS;  Surgeon: Hall Busing, MD;  Location: [place];  Service: General;  Laterality: Left;   none     TEE WITHOUT CARDIOVERSION N/A 11/11/2015   Procedure: TRANSESOPHAGEAL ECHOCARDIOGRAM (TEE);  Surgeon: Herminio Commons, MD;  [facility];  Service: Cardiovascular;  Laterality: N/A;       Home Medications    Prior to Admission medications   Medication Sig Start Date End Date Taking? Authorizing Provider  aspirin EC 81 MG tablet Take 1 tablet (81 mg total) by mouth daily. 04/26/22  Yes Minna Merritts, MD  atorvastatin (LIPITOR) 80 MG tablet Take 1 tablet (80 mg total) by mouth daily. 04/26/22  Yes Minna Merritts, MD  carvedilol (COREG) 25 MG tablet Take 1 tablet (25 mg total) by mouth 2 (two) times daily with a meal. 04/26/22  Yes Gollan, Kathlene November, MD  furosemide (LASIX) 40 MG tablet Take 1 tablet (40 mg total) by  mouth daily. 04/26/22  Yes Minna Merritts, MD  losartan (COZAAR) 100 MG tablet Take 1 tablet (100 mg total) by mouth daily. 04/26/22  Yes Gollan, Kathlene November, MD  predniSONE (DELTASONE) 20 MG tablet Take 2 tablets (40 mg total) by mouth daily for 5 days. 09/05/22 09/10/22 Yes Nandita Mathenia, Nicholes Stairs, NP    Family History Family History  Problem Relation Age of Onset   Cancer Father        pancreatic   Diabetes Mellitus II Maternal Grandmother    Diabetes Mellitus II Maternal Grandfather     Social History Social History   Tobacco Use   Smoking status: Never   Smokeless tobacco: Never  Vaping Use   Vaping Use: Never used   Substance Use Topics   Alcohol use: Yes    Alcohol/week: 0.0 standard drinks of alcohol    Comment: occasionally drinks beer   Drug use: No     Allergies   Patient has no known allergies.   Review of Systems Review of Systems  Constitutional:  Negative for activity change, chills and fever.  Respiratory:  Negative for chest tightness and shortness of breath.   Cardiovascular:  Positive for leg swelling (right upper thigh but no recent change from baseline). Negative for chest pain.  Musculoskeletal:  Positive for arthralgias, gait problem, joint swelling and myalgias.  Skin:  Positive for color change. Negative for rash and wound.  Allergic/Immunologic: Negative for environmental allergies, food allergies and immunocompromised state.  Neurological:  Positive for numbness (right thigh). Negative for dizziness, tremors, seizures, syncope, facial asymmetry, speech difficulty and light-headedness.  Hematological:  Negative for adenopathy. Does not bruise/bleed easily.     Physical Exam Triage Vital Signs ED Triage Vitals  Enc Vitals Group     BP 09/05/22 0828 122/87     Pulse Rate 09/05/22 0828 80     Resp 09/05/22 0828 16     Temp 09/05/22 0828 98.4 F (36.9 C)     Temp Source 09/05/22 0828 Oral     SpO2 09/05/22 0828 98 %     Weight 09/05/22 0827 240 lb (108.9 kg)     Height 09/05/22 0827 '6\' 1"'$  (1.854 m)     Head Circumference --      Peak Flow --      Pain Score 09/05/22 0827 10     Pain Loc --      Pain Edu? --      Excl. in [place]? --    No data found.  Updated Vital Signs BP 122/87 (BP Location: Left Arm)   Pulse 80   Temp 98.4 F (36.9 C) (Oral)   Resp 16   Ht '6\' 1"'$  (1.854 m)   Wt 240 lb (108.9 kg)   SpO2 98%   BMI 31.66 kg/m   Visual Acuity Right Eye Distance:   Left Eye Distance:   Bilateral Distance:    Right Eye Near:   Left Eye Near:    Bilateral Near:     Physical Exam Vitals and nursing note reviewed.  Constitutional:      General: He is  awake. He is not in acute distress.    Appearance: He is well-developed and well-groomed.     Comments: He is sitting on the exam table in no acute distress but appears uncomfortable due to pain.   HENT:     Head: Normocephalic and atraumatic.     Right Ear: Hearing normal.     Left Ear: Hearing normal.  Eyes:     Extraocular Movements: Extraocular movements intact.     Conjunctiva/sclera: Conjunctivae normal.  Cardiovascular:     Rate and Rhythm: Normal rate.     Pulses:          Dorsalis pedis pulses are 2+ on the right side and 2+ on the left side.       Posterior tibial pulses are 2+ on the right side and 2+ on the left side.  Pulmonary:     Effort: Pulmonary effort is normal.  Musculoskeletal:        General: Swelling and tenderness present.     Right lower leg: Normal. No swelling or tenderness. No edema.     Left lower leg: Normal. No swelling or tenderness. No edema.     Right ankle: Normal.     Right Achilles Tendon: Normal.     Left ankle: Normal.     Left Achilles Tendon: Normal.     Right foot: Decreased range of motion. Normal capillary refill. Swelling and tenderness present. No bunion. Normal pulse.     Left foot: Normal range of motion and normal capillary refill. Bunion present. No swelling or tenderness. Normal pulse.       Feet:  Feet:     Right foot:     Skin integrity: Skin integrity normal.     Toenail Condition: Right toenails are abnormally thick.     Left foot:     Toenail Condition: Left toenails are abnormally thick.     Comments: Right PMJ of great toe with redness, swelling and warmth. Very tender to palpation. Decreased range of motion of right toe and pain with any movement. Slightly tender at base of 1st metatarsal but otherwise remainder of foot with normal range of motion and good pulses and capillary refill. No neuro deficits noted. Bunion present on left foot but not right.  Skin:    General: Skin is warm and dry.     Capillary Refill:  Capillary refill takes less than 2 seconds.     Findings: Erythema present. No abscess, bruising, ecchymosis, signs of injury, lesion, petechiae, rash or wound.  Neurological:     General: No focal deficit present.     Mental Status: He is alert and oriented to person, place, and time.     Sensory: Sensation is intact. No sensory deficit.     Gait: Gait abnormal.     Comments: Difficulty walking due to pain in right toe/foot.   Psychiatric:        Mood and Affect: Mood normal.        Behavior: Behavior normal. Behavior is cooperative.        Thought Content: Thought content normal.        Judgment: Judgment normal.      UC Treatments / Results  Labs (all labs ordered are listed, but only abnormal results are displayed) Labs Reviewed  BASIC METABOLIC PANEL - Abnormal; Notable for the following components:      Result Value   Glucose, Bld 105 (*)    All other components within normal limits  URIC ACID    EKG   Radiology DG Foot Complete Right  Result Date: 09/05/2022 CLINICAL DATA:  Pain and edema x1 day.  No known injury. EXAM: RIGHT FOOT COMPLETE - 3+ VIEW COMPARISON:  No recent prior. FINDINGS: Diffuse degenerative change most prominent about the first MTP and first interphalangeal joints. Subchondral cyst noted in the distal left fifth metatarsal most likely degenerative. Mild  calcaneal spurring. No evidence of fracture or dislocation. Mild diffuse soft tissue swelling cannot be excluded. No radiopaque foreign body. IMPRESSION: 1. Diffuse degenerative change most prominent about the first MTP and first interphalangeal joints. Subchondral cyst noted in the distal left fifth metatarsal most likely degenerative. Mild calcaneal spurring. No acute bony abnormality identified. 2. Mild soft tissue swelling cannot be excluded. No radiopaque foreign body. Electronically Signed   By: Marcello Moores  Register M.D.   On: 09/05/2022 09:03    Procedures Procedures (including critical care  time)  Medications Ordered in UC Medications - No data to display  Initial Impression / Assessment and Plan / UC Course  I have reviewed the triage vital signs and the nursing notes.  Pertinent labs & imaging results that were available during my care of the patient were reviewed by me and considered in my medical decision making (see chart for details).     Reviewed x-ray results with patient- degenerative changes present in right 1st toe and into right 1st metatarsal. Possible soft tissue swelling. Mild heel spurs. Discussed degenerative changes usually indicate arthritis and possible gout. Obtained basic metabolic panel and uric acid level. Kidney function is normal and normal electrolytes with slightly elevated glucose level. Normal uric acid level. 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 Do not feel pain and swelling is related to CHF. Recommend start anti-inflammatory- Prednisone '40mg'$  daily for 5 days- take with food. May raise blood pressure so continue to monitor. May take OTC Ibuprofen '600mg'$  every 8 hours after finishing Prednisone if pain and swelling still present. May apply cool compresses to area and alternate with heat/warm soaks. Keep right foot elevated as much as possible. Note written for work. RN/CMA to assist patient in scheduling with a local PCP. Follow-up here at Urgent Care in 3 to 4 days if not improving or sooner if worsening.  Final Clinical Impressions(s) / UC Diagnoses   Final diagnoses:  Great toe pain, right  Localized swelling of right foot  History of congestive heart failure  Heel spur, right     Discharge Instructions      Recommend start Prednisone '40mg'$  daily for 5 days then stop. May take OTC Ibuprofen '600mg'$  every 8 hours after finishing Prednisone if still having any pain or swelling. May apply cool  compresses to area and alternate with heat for comfort. Keep right foot elevated as much as possible. Follow-up with a PCP or return here to Urgent Care in 3 to 4 days if not improving or sooner if worsening.      ED Prescriptions     Medication Sig Dispense Auth. Provider   predniSONE (DELTASONE) 20 MG tablet Take 2 tablets (40 mg total) by mouth daily for 5 days. 10 tablet Alisson Rozell, Nicholes Stairs, NP      PDMP not reviewed this encounter.   Katy Apo, NP 09/06/22 2022

## 2022-09-05 NOTE — ED Triage Notes (Signed)
Pt c/o R foot pain & edema x1 day, denies any trauma or injury. No prior hx of pain or edema before, limited ROM.

## 2022-09-12 ENCOUNTER — Encounter: Payer: Self-pay | Admitting: Emergency Medicine

## 2022-09-12 ENCOUNTER — Ambulatory Visit
Admission: EM | Admit: 2022-09-12 | Discharge: 2022-09-12 | Disposition: A | Discharge status: Home / Self Care | Payer: Self-pay | Attending: Emergency Medicine | Admitting: Emergency Medicine

## 2022-09-12 DIAGNOSIS — M79674 Pain in right toe(s): Secondary | ICD-10-CM

## 2022-09-12 MED ORDER — PREDNISONE 10 MG (21) PO TBPK: ORAL_TABLET | Freq: Every day | ORAL | 0 refills | Status: AC

## 2022-09-12 NOTE — ED Provider Notes (Signed)
[facility]    CSN: [id number] Arrival date & time: 09/12/22  0802      History   Chief Complaint Chief Complaint  Patient presents with   Foot Pain    right    HPI Austin Holmes is a 52 y.o. male.   Patient presents for right great toe pain and swelling present for 8 days.  Pain is along the medial aspect.  Painful to bear weight and therefore has been heel walking to compensate.  Has been difficult to wear shoes and has been wearing slides as an alternative.  Denies numbness or tingling, injury or trauma.  Was evaluated 7 days ago in urgent care for same symptoms, prescribed prednisone, completed all medication as directed,.  Improvement seen but has not resolved.  Has not taken any additional medication.  History of hypertension and CHF, stable.  Does not have PCP.  Past Medical History:  Diagnosis Date   Alcohol abuse    HLD (hyperlipidemia)    Myxoid liposarcoma (HCC)    L thigh   NICM (nonischemic cardiomyopathy) ([place])    a. 11/2015 Cath: nl cors; b. 04/2016 Echo: prominent LV trabeculations, EF 20-25, diff HK, mild LAE; c. 08/2017 Echo: EF 20-25%, diff HK. Gr2 DD. Mildly dil LA. Nl RV fxn.   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 ([place]) 11/03/2015   Cardiomyopathy ([place]) 11/03/2015   Abnormality of gait    Impulsive    HLD (hyperlipidemia)    TIA (transient ischemic attack) 11/02/2015   Acute CVA (cerebrovascular accident) ([place]) 11/02/2015    Past Surgical History:  Procedure Laterality Date   CARDIAC CATHETERIZATION N/A 11/30/2015   Procedure: Left Heart Cath and Coronary Angiography;  Surgeon: Jettie Booze, MD;   Location: [place] [facility];  Service: Cardiovascular;  Laterality: N/A;   EXCISION OF LEFT THIGH MASS Left 05/24/2016   MASS EXCISION Left 05/24/2016   Procedure: EXCISION of left thigh MASS;  Surgeon: Hall Busing, MD;  Location: [place];  Service: General;  Laterality: Left;   none     TEE WITHOUT CARDIOVERSION N/A 11/11/2015   Procedure: TRANSESOPHAGEAL ECHOCARDIOGRAM (TEE);  Surgeon: Herminio Commons, MD;  Location: [facility] ENDOSCOPY;  Service: Cardiovascular;  Laterality: N/A;       Home Medications    Prior to Admission medications   Medication Sig Start Date End Date Taking? Authorizing Provider  aspirin EC 81 MG tablet Take 1 tablet (81 mg total) by mouth daily. 04/26/22  Yes Minna Merritts, MD  atorvastatin (LIPITOR) 80 MG tablet Take 1 tablet (80 mg total) by mouth daily. 04/26/22  Yes Minna Merritts, MD  carvedilol (COREG) 25 MG tablet Take 1 tablet (25 mg total) by mouth 2 (two) times daily with a meal. 04/26/22  Yes Gollan, Kathlene November, MD  furosemide (LASIX) 40 MG tablet Take 1 tablet (40 mg total) by mouth daily. 04/26/22  Yes Minna Merritts, MD  losartan (COZAAR) 100 MG tablet Take 1 tablet (100 mg total) by mouth daily. 04/26/22  Yes Minna Merritts, MD    Family History Family History  Problem  Relation Age of Onset   Cancer Father        pancreatic   Diabetes Mellitus II Maternal Grandmother    Diabetes Mellitus II Maternal Grandfather     Social History Social History   Tobacco Use   Smoking status: Never   Smokeless tobacco: Never  Vaping Use   Vaping Use: Never used  Substance Use Topics   Alcohol use: Yes    Alcohol/week: 0.0 standard drinks of alcohol    Comment: occasionally drinks beer   Drug use: No     Allergies   Patient has no known allergies.   Review of Systems Review of Systems   Physical Exam Triage Vital Signs ED Triage Vitals  Enc Vitals Group     BP 09/12/22 0813 114/78     Pulse Rate 09/12/22 0813 91     Resp  09/12/22 0813 16     Temp 09/12/22 0813 98.1 F (36.7 C)     Temp Source 09/12/22 0813 Oral     SpO2 09/12/22 0813 98 %     Weight 09/12/22 0814 240 lb 1.3 oz (108.9 kg)     Height 09/12/22 0814 6' 1"$  (1.854 m)     Head Circumference --      Peak Flow --      Pain Score 09/12/22 0811 8     Pain Loc --      Pain Edu? --      Excl. in [place]? --    No data found.  Updated Vital Signs BP 114/78 (BP Location: Right Arm)   Pulse 91   Temp 98.1 F (36.7 C) (Oral)   Resp 16   Ht 6' 1"$  (1.854 m)   Wt 240 lb 1.3 oz (108.9 kg)   SpO2 98%   BMI 31.67 kg/m   Visual Acuity Right Eye Distance:   Left Eye Distance:   Bilateral Distance:    Right Eye Near:   Left Eye Near:    Bilateral Near:     Physical Exam Constitutional:      Appearance: Normal appearance.  Eyes:     Extraocular Movements: Extraocular movements intact.  Pulmonary:     Effort: Pulmonary effort is normal.  Musculoskeletal:     Comments: Tenderness and mild to moderate swelling present along the medial aspect of the right great toe without erythema, ecchymosis or deformity, able to bear weight but pain is elicited, able to complete flexion and extension of the toe but pain is elicited, capillary refill less than 3, sensation intact, 2+ pedal pulse  Neurological:     Mental Status: He is alert and oriented to person, place, and time. Mental status is at baseline.      UC Treatments / Results  Labs (all labs ordered are listed, but only abnormal results are displayed) Labs Reviewed - No data to display  EKG   Radiology No results found.  Procedures Procedures (including critical care time)  Medications Ordered in UC Medications - No data to display  Initial Impression / Assessment and Plan / UC Course  I have reviewed the triage vital signs and the nursing notes.  Pertinent labs & imaging results that were available during my care of the patient were reviewed by me and considered in my medical  decision making (see chart for details).  Right great toe pain  Etiology appears to be inflammatory, no signs of infection, low suspicion for gout as uric acid levels were normal during last visit on 09/05/2022,  possibly arthritic pain we will move forward with an additional treatment of steroids, course extended, recommended additional supportive measures through [facility], PCP referral placed and advised follow-up with urgent care as needed, work note given Final Clinical Impressions(s) / UC Diagnoses   Final diagnoses:  None   Discharge Instructions   None    ED Prescriptions   None    PDMP not reviewed this encounter.   Hans Eden, [place] 09/12/22 [phone]

## 2022-09-12 NOTE — ED Triage Notes (Signed)
Pt c/o right foot pain and swelling. He was seen on 09/05/22 and treated for gout. He states the pain has improved but but the swelling is still there and he is unable to put his boots on, he can only wear slides.

## 2022-09-12 NOTE — Discharge Instructions (Signed)
Today you are being treated for your toe pain  Since prednisone was helpful but did not fully resolve restart medication and extend course from 6 to 12 days, start taking every morning as directed  You may use ice or heat over the affected area whichever feels best  You may elevate your leg and foot when sitting and lying to help reduce swelling  A primary care doctor referral has been placed.  As if you continue to have reoccurring symptoms you need a primary doctor to prescribe daily medicines to help

## 2022-09-21 ENCOUNTER — Encounter (HOSPITAL_COMMUNITY): Payer: Self-pay

## 2023-03-26 ENCOUNTER — Telehealth: Payer: Self-pay | Admitting: Cardiovascular Disease

## 2023-03-26 ENCOUNTER — Other Ambulatory Visit: Payer: Self-pay | Admitting: Cardiovascular Disease

## 2023-03-26 NOTE — Telephone Encounter (Signed)
Unable to leave voice mail due to mailbox being full. Pt needs to be scheduled for 12 mo follow up appointment from recall.

## 2023-03-26 NOTE — Telephone Encounter (Signed)
last visit with Dr. Mariah Milling on 04/26/22 with plan to f/u in 12 months. Please schedule f/u appt.  Thanks

## 2023-03-26 NOTE — Telephone Encounter (Signed)
Unable to leave voicemail due to mailbox being full.

## 2023-03-29 ENCOUNTER — Encounter: Payer: Self-pay | Admitting: Cardiovascular Disease

## 2023-03-29 NOTE — Telephone Encounter (Signed)
Unable to reach letter being sent via mail

## 2023-03-29 NOTE — Telephone Encounter (Signed)
Unable to leave messge, unable to reach letter being sent via mail.

## 2023-03-29 NOTE — Telephone Encounter (Signed)
Patient called, unable to leave voice mail

## 2023-06-23 ENCOUNTER — Other Ambulatory Visit: Payer: Self-pay | Admitting: Cardiovascular Disease

## 2023-06-25 ENCOUNTER — Telehealth: Payer: Self-pay | Admitting: Cardiovascular Disease

## 2023-06-25 NOTE — Telephone Encounter (Signed)
Left voice mail

## 2023-06-25 NOTE — Telephone Encounter (Signed)
Left voicemail, pt needs appt scheduled from recall

## 2023-06-25 NOTE — Telephone Encounter (Signed)
last visit 04/16/22 with plan to f/u in 12 months.  Next visit: none/active recall  Plase schedule f/u appt.  Thanks!

## 2023-07-06 ENCOUNTER — Telehealth: Payer: Self-pay | Admitting: Cardiovascular Disease

## 2023-07-06 ENCOUNTER — Encounter: Payer: Self-pay | Admitting: Cardiovascular Disease

## 2023-07-06 NOTE — Telephone Encounter (Signed)
Unable to leave message due to voicemail being full. Unable to reach letter being sent via mail.

## 2023-07-06 NOTE — Telephone Encounter (Signed)
Mailbox is full, unable to reach letter sent via mail

## 2023-11-15 ENCOUNTER — Other Ambulatory Visit: Payer: Self-pay | Admitting: Cardiovascular Disease

## 2023-12-09 ENCOUNTER — Other Ambulatory Visit: Payer: Self-pay | Admitting: Cardiovascular Disease

## 2024-02-06 ENCOUNTER — Other Ambulatory Visit: Payer: Self-pay | Admitting: Cardiovascular Disease

## 2024-02-26 ENCOUNTER — Encounter: Payer: Self-pay | Admitting: Cardiovascular Disease

## 2024-03-10 ENCOUNTER — Telehealth: Payer: Self-pay | Admitting: Cardiovascular Disease

## 2024-03-10 MED ORDER — CARVEDILOL 25 MG PO TABS: 25.0000 mg | ORAL_TABLET | Freq: Two times a day (BID) | ORAL | 0 refills | Status: DC

## 2024-03-10 NOTE — Telephone Encounter (Signed)
 Chart reviewed.   Last appointment was: 04/26/2022 with Dr Gollan.  Next appt is 04/28/2024 with Dr Perla  Rx(s) sent to pharmacy electronically.  Left message informing the patient that their rx's have been sent to the pharmacy. Advised a call back with any questions or concerns.   50 days of medication has been sent to pharmacy; this will last until the patients appointment.

## 2024-03-10 NOTE — Telephone Encounter (Signed)
*  STAT* If patient is at the pharmacy, call can be transferred to refill team.   1. Which medications need to be refilled? (please list name of each medication and dose if known)   carvedilol  (COREG ) 25 MG tablet   2. Would you like to learn more about the convenience, safety, & potential cost savings by using the [facility]?   3. Are you open to using the [facility] (Type [facility]. ).  4. Which pharmacy/location (including street and city if local pharmacy) is medication to be sent to?  [facility] - [place] ([facility]),  - [address]   5. Do they need a 30 day or 90 day supply?   [facility]) stated patient is completely out of this medication.  Patient has appointment scheduled with Dr. Gollan on 9/29.

## 2024-04-27 NOTE — Progress Notes (Deleted)
 Cardiology Office Note  Date:  04/27/2024   ID:  Austin Holmes, DOB Dec 03, 1970, MRN [id number]  PCP:  Patient, [name]   No chief complaint on file.   HPI:  53 year old gentleman with past medical history of  nonischemic dilated cardiomyopathy,  ejection fraction 25%, in 2017 cardiac catheterization May 2017 with no coronary disease, history of chronic alcohol use/beer daily, much less now History of stroke April 2017,right side left bundle branch block  He presents for routine follow-up of his nonischemic dilated cardiomyopathy  LOV 9/23    Last seen by one of our providers May 2022 On that visit had no insurance, did not have PCP, no recent lab work Works in [employer], on his feet all day Was drinking a six pack beer week  In follow-up today reports that he feels well Continues to lift heavy cylinders, sometimes full of water, feels it is a strain on his body, back  Reports he is out of his Lasix  and losartan  for short period of time Denies leg swelling, no abdominal distention from fluid, no cough, no PND orthopnea  No recent hospitalizations Still drinks beer  EKG personally reviewed by myself on todays visit Normal sinus rhythm rate 75 bpm left bundle branch block  Other past medical history reviewed in the hospital October 2017 for left proximal thigh lipomatous tumor, liposarcoma He had radical resection, this measured 7 cm x 9 cm   Echocardiogram 05/18/2016  as well as prior study April 2017 Ejection fraction 20-25%, diffuse hypokinesis,  Previous echocardiogram April 2017 with ejection fraction 20%   Prior carotid ultrasound with no stenosis by report  PMH:   has a past medical history of Alcohol abuse, HLD (hyperlipidemia), Myxoid liposarcoma (HCC), NICM (nonischemic cardiomyopathy) (HCC), and Stroke (HCC) (2017).  PSH:    Past Surgical History:  Procedure Laterality Date   CARDIAC CATHETERIZATION N/A 11/30/2015   Procedure: Left Heart Cath  and Coronary Angiography;  Surgeon: Candyce GORMAN Reek, MD;  Location: [facility] INVASIVE CV LAB;  Service: Cardiovascular;  Laterality: N/A;   EXCISION OF LEFT THIGH MASS Left 05/24/2016   MASS EXCISION Left 05/24/2016   Procedure: EXCISION of left thigh MASS;  Surgeon: Zada JAYSON Hoard, MD;  Location: [facility] OR;  Service: General;  Laterality: Left;   none     TEE WITHOUT CARDIOVERSION N/A 11/11/2015   Procedure: TRANSESOPHAGEAL ECHOCARDIOGRAM (TEE);  Surgeon: Pearla DELENA Rout, MD;  Location: [facility] ENDOSCOPY;  Service: Cardiovascular;  Laterality: N/A;    Current Outpatient Medications  Medication Sig Dispense Refill   aspirin  EC 81 MG tablet Take 1 tablet (81 mg total) by mouth daily. 90 tablet 3   atorvastatin  (LIPITOR) 80 MG tablet Take 1 tablet (80 mg total) by mouth daily. PLEASE CALL OFFICE TO SCHEDULE APPOINTMENT PRIOR TO NEXT REFILL 30 tablet 0   carvedilol  (COREG ) 25 MG tablet Take 1 tablet (25 mg total) by mouth 2 (two) times daily with a meal. 100 tablet 0   furosemide  (LASIX ) 40 MG tablet Take 1 tablet (40 mg total) by mouth daily. PLEASE CALL OFFICE TO SCHEDULE APPOINTMENT PRIOR TO NEXT REFILL 30 tablet 0   losartan  (COZAAR ) 100 MG tablet Take 1 tablet (100 mg total) by mouth daily. PLEASE CALL OFFICE TO SCHEDULE APPOINTMENT PRIOR TO NEXT REFILL 30 tablet 0   predniSONE  (STERAPRED UNI-PAK 21 TAB) 10 MG (21) TBPK tablet Take by mouth daily. Take 6 tabs by mouth daily  for 2 days, then 5 tabs for 2 days,  then 4 tabs for 2 days, then 3 tabs for 2 days, 2 tabs for 2 days, then 1 tab by mouth daily for 2 days 42 tablet 0   No current facility-administered medications for this visit.    Allergies:   Patient has no known allergies.   Social History:  The patient  reports that he has never smoked. He has never used smokeless tobacco. He reports current alcohol use. He reports that he does not use drugs.   Family History:   family history includes Cancer in his father; Diabetes Mellitus II in his  maternal grandfather and maternal grandmother.    Review of Systems: Review of Systems  Constitutional: Negative.   Respiratory: Negative.    Cardiovascular: Negative.   Gastrointestinal: Negative.   Musculoskeletal: Negative.   Neurological: Negative.   Psychiatric/Behavioral: Negative.    All other systems reviewed and are negative.   PHYSICAL EXAM: VS:  There were no vitals taken for this visit. , BMI There is no height or weight on file to calculate BMI. Constitutional:  oriented to person, place, and time. No distress.  HENT:  Head: Grossly normal Eyes:  no discharge. No scleral icterus.  Neck: No JVD, no carotid bruits  Cardiovascular: Regular rate and rhythm, no murmurs appreciated Pulmonary/Chest: Clear to auscultation bilaterally, no wheezes or rails Abdominal: Soft.  no distension.  no tenderness.  Musculoskeletal: Normal range of motion Neurological:  normal muscle tone. Coordination normal. No atrophy Skin: Skin warm and dry Psychiatric: normal affect, pleasant  Recent Labs: No results found for requested labs within last 365 days.    Lipid Panel Lab Results  Component Value Date   CHOL 228 (H) 12/09/2020   HDL 67 12/09/2020   LDLCALC 141 (H) 12/09/2020   TRIG 115 12/09/2020      Wt Readings from Last 3 Encounters:  09/12/22 240 lb 1.3 oz (108.9 kg)  09/05/22 240 lb (108.9 kg)  04/26/22 246 lb 2 oz (111.6 kg)      ASSESSMENT AND PLAN:  Alcoholic cardiomyopathy (HCC) - Plan: EKG 12-Lead Recommend continued alcohol cessation including beer, this was discussed Medications refilled, he is out of several Weight is down from prior clinic visit BMP ordered  Mixed hyperlipidemia - Plan: EKG 12-Lead Encouraged him to stay on his Lipitor Lipid panel ordered through [facility]  Chronic systolic CHF (congestive heart failure) (HCC) - Plan: EKG 12-Lead Weight stable, trending downward, appears euvolemic BMP ordered, continue Lasix  Recommend all alcohol  cessation  Acute CVA (cerebrovascular accident) (HCC) - Plan: EKG 12-Lead Etiology unclear, possibly mural thrombus in the past Aspirin  81 daily  Malignant lipomatous tumor (HCC) - Plan: EKG 12-Lead Recovered well from surgery   Total encounter time more than 30 minutes  Greater than 50% was spent in counseling and coordination of care with the patient    No orders of the defined types were placed in this encounter.    Signed, Velinda Lunger, M.D., Ph.D. 04/27/2024  [facility] [place], [place] [phone]

## 2024-04-28 ENCOUNTER — Ambulatory Visit: Payer: Self-pay | Attending: Cardiovascular Disease | Admitting: Cardiovascular Disease

## 2024-04-28 DIAGNOSIS — I5022 Chronic systolic (congestive) heart failure: Secondary | ICD-10-CM

## 2024-04-28 DIAGNOSIS — E782 Mixed hyperlipidemia: Secondary | ICD-10-CM

## 2024-04-28 DIAGNOSIS — I420 Dilated cardiomyopathy: Secondary | ICD-10-CM

## 2024-04-28 DIAGNOSIS — I63432 Cerebral infarction due to embolism of left posterior cerebral artery: Secondary | ICD-10-CM

## 2024-04-28 DIAGNOSIS — I950 Idiopathic hypotension: Secondary | ICD-10-CM

## 2024-04-28 DIAGNOSIS — G459 Transient cerebral ischemic attack, unspecified: Secondary | ICD-10-CM

## 2024-07-01 ENCOUNTER — Other Ambulatory Visit: Payer: Self-pay | Admitting: Cardiovascular Disease

## 2024-07-01 NOTE — ED Provider Notes (Signed)
 Emergency Department Provider Note  Austin Holmes is a 54 y.o. male with a past medical history of hypertension hyperlipidemia who presents with requesting medication refill.  Patient states that he has not been able to get in touch with his doctor.  No other complaints or concerns.  Past Medical History[1] Past Surgical History[2]  BRIEF PHYSICAL EXAM   BP 123/110   Pulse 89   Temp 36.6 C (97.9 F) (Oral)   Resp 18   Ht 185.4 cm (6' 1)   Wt (!) 106.6 kg (235 lb)   SpO2 100%   BMI 31.00 kg/m  CONSTITUTIONAL:  Awake, alert, no apparent distress  HEENT: Conjunctiva normal CARD: Well perfused hands. Normal cap refill RESP: Normal work of breathing MSK: Moving without difficulty NEURO: Alert and appropriate  PLAN Patient denies other complaints or concerns, declines further medical eval.  States that he will continue to try to reach his physician.  I will refill medications that were listed in the chart including losartan , Lasix  and Lipitor.  Return precautions given.       [1] No past medical history on file. [2] No past surgical history on file.  Billy Hoy Cutting, MD 07/01/24 [phone]

## 2024-07-01 NOTE — Progress Notes (Unsigned)
 Cardiology Clinic Note   Date: 07/02/2024 ID: Austin Holmes, DOB 05/13/1971, MRN [id number]  Primary Cardiologist:  Evalene Lunger, MD  Chief Complaint   Austin Holmes is a 53 y.o. male who presents to the clinic today for overdue follow up.   Patient Profile   Austin Holmes is followed by Dr. Gollan for the history outlined below.      Past medical history significant for: Chronic HFrEF/nonischemic cardiomyopathy. Echo 01/11/2021: EF 20 to 25%.  Global hypokinesis.  Moderately dilated left ventricular internal cavity.  Grade II DD.  Normal RV size/function.  Moderate LAE.  Mild MR. Hyperlipidemia. Lipid panel 04/09/2023: LDL 78, HDL 69, TG 72, total 161. Hypertension. CVA. 2017. Myxoid liposarcoma. Alcohol use.  In summary, patient with a history of stroke in April 2017 with residual right-sided weakness.  Echo at that time demonstrated EF 20 to 25%, diffuse hypokinesis, mild BAE, mild RVH.  TEE was negative for intracardiac source of thrombus.  LHC May 2017 demonstrated no significant CAD.  LV function continued to be depressed with repeat echo was in October 2017 and February 2019.  Options for GDMT limited secondary to patient not having insurance.  Patient was last seen in the office by Dr. Gollan on 12/24/2021 for routine follow-up.  He had been out of several medications including losartan  and Lasix .  He was counseled on alcohol cessation.     History of Present Illness    Today, patient is doing well. Patient denies shortness of breath, dyspnea on exertion, lower extremity edema, orthopnea or PND. No chest pain, pressure, or tightness. No palpitations. 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 He has been out of his medications for 2 weeks. He presented to the ED one day ago to get his medications and restarted them as of yesterday morning. Patient is unsure of his insurance status. He does not think he has medicaid but is  not insured through his employer as it is too expensive. Patient does not smoke. He drinks beer occasionally about 2 times a month.       ROS: All other systems reviewed and are otherwise negative except as noted in History of Present Illness.  EKGs/Labs Reviewed    EKG Interpretation Date/Time:  Wednesday July 02 2024 08:04:46 EST Ventricular Rate:  80 PR Interval:  142 QRS Duration:  182 QT Interval:  462 QTC Calculation: 532 R Axis:   27  Text Interpretation: Normal sinus rhythm Left bundle branch block When compared with ECG of 04/26/2022 (not in [facility]) No significant change was found Confirmed by Loistine Sober [phone]) on 07/02/2024 8:11:14 AM    Physical Exam    VS:  BP 128/80 (BP Location: Left Arm, Patient Position: Sitting, Cuff Size: Normal)   Pulse 80   Ht 6' 1 (1.854 m)   Wt 233 lb 2 oz (105.7 kg)   SpO2 98%   BMI 30.76 kg/m  , BMI Body mass index is 30.76 kg/m.  GEN: Well nourished, well developed, in no acute distress. Neck: No JVD or carotid bruits. Cardiac:  RRR.  No murmur. No rubs or gallops.   Respiratory:  Respirations regular and unlabored. Clear to auscultation without rales, wheezing or rhonchi. GI: Soft, nontender, nondistended. Extremities: Radials/DP/PT 2+ and equal bilaterally. No clubbing or cyanosis. No edema   Skin: Warm and dry, no rash. Neuro: Strength intact.  Assessment & Plan   Chronic HFrEF/nonischemic cardiomyopathy Echo June 2022 demonstrated EF 20-25%, global  hypokinesis, Grade II DD, normal RV size/function, moderate LAE, mild MR.  Patient denies shortness of breath, DOE, lower extremity edema, orthopnea or PND. 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 Euvolemic and well compensated on exam. Labs were not drawn while patient was here. Message left for patient to as him to return this week for lab work. Orders will be placed.  - Consider repeat echo at follow up.  - Continue carvedilol , losartan , Lasix . - CBC and CMP upon  patient's return.   Hypertension BP today 128/80. No report of headaches or dizziness.  - Continue losartan , carvedilol .  Hyperlipidemia LDL 78 September 2024. - Lipid panel upon patient's return.  Insurance/financial concerns Patient is unsure what insurance he has. He does not think he has medicaid. He is not able to get insurance through his employer as it is too expensive.  - Will reach out to social work to see if they can assist patient.   Disposition: CBC, CMP, lipid panel - labs were not drawn while patient was here. Message left for patient to return sometime this week for labs. Return in 3 months or sooner as needed.          Signed, Barnie HERO. Princes Finger, DNP, NP-C

## 2024-07-02 ENCOUNTER — Other Ambulatory Visit: Payer: Self-pay | Admitting: Emergency Medicine

## 2024-07-02 ENCOUNTER — Ambulatory Visit: Attending: Student | Admitting: Student

## 2024-07-02 ENCOUNTER — Telehealth: Payer: Self-pay | Admitting: Licensed Clinical Social Worker

## 2024-07-02 ENCOUNTER — Encounter: Payer: Self-pay | Admitting: Student

## 2024-07-02 VITALS — BP 128/80 | HR 80 | Ht 73.0 in | Wt 233.1 lb

## 2024-07-02 DIAGNOSIS — Z8673 Personal history of transient ischemic attack (TIA), and cerebral infarction without residual deficits: Secondary | ICD-10-CM | POA: Diagnosis not present

## 2024-07-02 DIAGNOSIS — I10 Essential (primary) hypertension: Secondary | ICD-10-CM | POA: Diagnosis present

## 2024-07-02 DIAGNOSIS — Z5971 Insufficient health insurance coverage: Secondary | ICD-10-CM | POA: Insufficient documentation

## 2024-07-02 DIAGNOSIS — I5022 Chronic systolic (congestive) heart failure: Secondary | ICD-10-CM | POA: Diagnosis not present

## 2024-07-02 DIAGNOSIS — E782 Mixed hyperlipidemia: Secondary | ICD-10-CM | POA: Insufficient documentation

## 2024-07-02 DIAGNOSIS — I428 Other cardiomyopathies: Secondary | ICD-10-CM | POA: Insufficient documentation

## 2024-07-02 DIAGNOSIS — Z79899 Other long term (current) drug therapy: Secondary | ICD-10-CM

## 2024-07-02 MED ORDER — FUROSEMIDE 40 MG PO TABS: 40.0000 mg | ORAL_TABLET | Freq: Every day | ORAL | 3 refills | Status: AC

## 2024-07-02 MED ORDER — ATORVASTATIN CALCIUM 80 MG PO TABS: 80.0000 mg | ORAL_TABLET | Freq: Every day | ORAL | 3 refills | Status: AC

## 2024-07-02 MED ORDER — LOSARTAN POTASSIUM 100 MG PO TABS: 100.0000 mg | ORAL_TABLET | Freq: Every day | ORAL | 3 refills | Status: AC

## 2024-07-02 MED ORDER — CARVEDILOL 25 MG PO TABS: 25.0000 mg | ORAL_TABLET | Freq: Two times a day (BID) | ORAL | 3 refills | Status: AC

## 2024-07-02 NOTE — Patient Instructions (Signed)
 Medication Instructions:   Your physician recommends that you continue on your current medications as directed. Please refer to the Current Medication list given to you today.    *If you need a refill on your cardiac medications before your next appointment, please call your pharmacy*  Lab Work:  None ordered at this time   If you have labs (blood work) drawn today and your tests are completely normal, you will receive your results only by:  MyChart Message (if you have MyChart) OR  A paper copy in the mail If you have any lab test that is abnormal or we need to change your treatment, we will call you to review the results.  Testing/Procedures:  None ordered at this time   Referrals:  None ordered at this time   Follow-Up:  At [facility], you and your health needs are our priority.  As part of our continuing mission to provide you with exceptional heart care, our providers are all part of one team.  This team includes your primary Cardiologist (physician) and Advanced Practice Providers or APPs (Physician Assistants and Nurse Practitioners) who all work together to provide you with the care you need, when you need it.  Your next appointment:   3 month(s)  Provider:    Barnie Hila, NP    We recommend signing up for the patient portal called MyChart.  Sign up information is provided on this After Visit Summary.  MyChart is used to connect with patients for Virtual Visits (Telemedicine).  Patients are able to view lab/test results, encounter notes, upcoming appointments, etc.  Non-urgent messages can be sent to your provider as well.   To learn more about what you can do with MyChart, go to [email or web address].

## 2024-07-02 NOTE — Telephone Encounter (Signed)
 H&V Care Navigation CSW Progress Note  Clinical Social Worker contacted patient by phone to discuss benefits/provide ID # for future visits and how to get card. No answer today at [phone], left voicemail, will re-attempt again as able.  Patient is participating in a Managed Medicaid Plan:  Yes- [place] Complete  SDOH Screenings   Food Insecurity: No Food Insecurity (04/09/2023)   Received from [facility]  Transportation Needs: No Transportation Needs (04/09/2023)   Received from [facility]  Utilities: Low Risk (04/09/2023)   Received from [facility]  Financial Resource Strain: Low Risk (04/09/2023)   Received from [facility]  Tobacco Use: Low Risk  (07/02/2024)    Marit Lark, MSW, LCSW Clinical Social Worker II [facility] Heart/Vascular Care Navigation  [phone]- work cell phone (preferred)

## 2024-07-02 NOTE — Progress Notes (Signed)
 Patient called and left voicemail for him to have lab work done.  Call back number provided.

## 2024-07-02 NOTE — Addendum Note (Signed)
 Addended by: Hjalmar Ballengee A on: 07/02/2024 10:45 AM   Modules accepted: Orders

## 2024-07-02 NOTE — Telephone Encounter (Signed)
 H&V Care Navigation CSW Progress Note  Clinical Social Worker received a message from pt provider to check on pt insurance.  Was able to check [employer] system, pt active with [place] Complete medicaid. Medications and appts will have $4 copay, sent provider info about AR accounts. Remain available should pt have any additional questions.  Patient is participating in a Managed Medicaid Plan:  Yes- [place] Complete  SDOH Screenings   Food Insecurity: No Food Insecurity (04/09/2023)   Received from [facility]  Transportation Needs: No Transportation Needs (04/09/2023)   Received from [facility]  Utilities: Low Risk (04/09/2023)   Received from [facility]  Financial Resource Strain: Low Risk (04/09/2023)   Received from [facility]  Tobacco Use: Low Risk  (07/02/2024)    Marit Lark, MSW, LCSW Clinical Social Worker II [facility] Heart/Vascular Care Navigation  [phone]- work cell phone (preferred)

## 2024-07-03 ENCOUNTER — Telehealth: Payer: Self-pay | Admitting: Licensed Clinical Social Worker

## 2024-07-03 NOTE — Telephone Encounter (Signed)
 H&V Care Navigation CSW Progress Note  Clinical Social Worker contacted patient by phone to discuss Medicaid benefits/provide ID # for future visits and how to get card. No answer today at [phone], left 2nd voicemail, will re-attempt again as able.   Patient is participating in a Managed Medicaid Plan:  Yes- [place] Complete   SDOH Screenings   Food Insecurity: No Food Insecurity (04/09/2023)   Received from [facility]  Transportation Needs: No Transportation Needs (04/09/2023)   Received from [facility]  Utilities: Low Risk (04/09/2023)   Received from [facility]  Financial Resource Strain: Low Risk (04/09/2023)   Received from [facility]  Tobacco Use: Low Risk  (07/02/2024)     Marit Lark, MSW, LCSW Clinical Social Worker II [facility] Heart/Vascular Care Navigation  [phone]- work cell phone (preferred)

## 2024-07-04 ENCOUNTER — Telehealth: Payer: Self-pay | Admitting: Licensed Clinical Social Worker

## 2024-07-04 NOTE — Telephone Encounter (Signed)
 H&V Care Navigation CSW Progress Note  Clinical Social Worker contacted patient by phone to discuss Medicaid benefits/provide ID # for future visits and how to get card. No answer today at [phone], left 3rd voicemail, remain available should pt return any of the voicemail messages left at this time.    Patient is participating in a Managed Medicaid Plan:  Yes- [place] Complete  SDOH Screenings   Food Insecurity: No Food Insecurity (04/09/2023)   Received from [facility]  Transportation Needs: No Transportation Needs (04/09/2023)   Received from [facility]  Utilities: Low Risk (04/09/2023)   Received from [facility]  Financial Resource Strain: Low Risk (04/09/2023)   Received from [facility]  Tobacco Use: Low Risk  (07/02/2024)    Austin Holmes, MSW, LCSW Clinical Social Worker II [facility] Heart/Vascular Care Navigation  [phone]- work cell phone (preferred)

## 2024-09-30 ENCOUNTER — Ambulatory Visit: Admitting: Student
